# Patient Record
Sex: Male | Born: 1945
Health system: Southern US, Community
[De-identification: ages and names within clinical notes are randomized; demographics above are authoritative.]

## PROBLEM LIST (undated history)

## (undated) DIAGNOSIS — E119 Type 2 diabetes mellitus without complications: Secondary | ICD-10-CM

## (undated) DIAGNOSIS — I679 Cerebrovascular disease, unspecified: Secondary | ICD-10-CM

## (undated) DIAGNOSIS — I2510 Atherosclerotic heart disease of native coronary artery without angina pectoris: Secondary | ICD-10-CM

## (undated) DIAGNOSIS — E785 Hyperlipidemia, unspecified: Secondary | ICD-10-CM

## (undated) DIAGNOSIS — Z87442 Personal history of urinary calculi: Secondary | ICD-10-CM

## (undated) DIAGNOSIS — Z720 Tobacco use: Secondary | ICD-10-CM

## (undated) DIAGNOSIS — N400 Enlarged prostate without lower urinary tract symptoms: Secondary | ICD-10-CM

## (undated) DIAGNOSIS — I739 Peripheral vascular disease, unspecified: Secondary | ICD-10-CM

## (undated) DIAGNOSIS — I10 Essential (primary) hypertension: Secondary | ICD-10-CM

## (undated) HISTORY — DX: Benign prostatic hyperplasia without lower urinary tract symptoms: N40.0

## (undated) HISTORY — DX: Essential (primary) hypertension: I10

## (undated) HISTORY — DX: Tobacco use: Z72.0

## (undated) HISTORY — DX: Personal history of urinary calculi: Z87.442

## (undated) HISTORY — DX: Type 2 diabetes mellitus without complications: E11.9

## (undated) HISTORY — PX: CORONARY ANGIOPLASTY: SHX604

## (undated) HISTORY — DX: Cerebrovascular disease, unspecified: I67.9

## (undated) HISTORY — DX: Hyperlipidemia, unspecified: E78.5

---

## 2015-10-24 DIAGNOSIS — Z0000 Encounter for general adult medical examination without abnormal findings: Secondary | ICD-10-CM | POA: Diagnosis not present

## 2015-10-24 DIAGNOSIS — N400 Enlarged prostate without lower urinary tract symptoms: Secondary | ICD-10-CM | POA: Diagnosis not present

## 2015-10-24 DIAGNOSIS — E119 Type 2 diabetes mellitus without complications: Secondary | ICD-10-CM | POA: Diagnosis not present

## 2015-10-24 DIAGNOSIS — E7800 Pure hypercholesterolemia, unspecified: Secondary | ICD-10-CM | POA: Diagnosis not present

## 2015-10-24 DIAGNOSIS — I2510 Atherosclerotic heart disease of native coronary artery without angina pectoris: Secondary | ICD-10-CM | POA: Diagnosis not present

## 2015-10-24 DIAGNOSIS — Z1389 Encounter for screening for other disorder: Secondary | ICD-10-CM | POA: Diagnosis not present

## 2015-10-24 DIAGNOSIS — I10 Essential (primary) hypertension: Secondary | ICD-10-CM | POA: Diagnosis not present

## 2015-11-08 DIAGNOSIS — T783XXA Angioneurotic edema, initial encounter: Secondary | ICD-10-CM | POA: Diagnosis not present

## 2015-12-07 DIAGNOSIS — H353131 Nonexudative age-related macular degeneration, bilateral, early dry stage: Secondary | ICD-10-CM | POA: Diagnosis not present

## 2016-01-22 DIAGNOSIS — E7800 Pure hypercholesterolemia, unspecified: Secondary | ICD-10-CM | POA: Diagnosis not present

## 2016-01-22 DIAGNOSIS — E119 Type 2 diabetes mellitus without complications: Secondary | ICD-10-CM | POA: Diagnosis not present

## 2016-01-22 DIAGNOSIS — I10 Essential (primary) hypertension: Secondary | ICD-10-CM | POA: Diagnosis not present

## 2016-04-08 DIAGNOSIS — L03011 Cellulitis of right finger: Secondary | ICD-10-CM | POA: Diagnosis not present

## 2016-04-12 DIAGNOSIS — L03011 Cellulitis of right finger: Secondary | ICD-10-CM | POA: Diagnosis not present

## 2016-04-24 DIAGNOSIS — I10 Essential (primary) hypertension: Secondary | ICD-10-CM | POA: Diagnosis not present

## 2016-04-24 DIAGNOSIS — E119 Type 2 diabetes mellitus without complications: Secondary | ICD-10-CM | POA: Diagnosis not present

## 2016-04-24 DIAGNOSIS — E7800 Pure hypercholesterolemia, unspecified: Secondary | ICD-10-CM | POA: Diagnosis not present

## 2016-06-10 DIAGNOSIS — H5203 Hypermetropia, bilateral: Secondary | ICD-10-CM | POA: Diagnosis not present

## 2016-06-10 DIAGNOSIS — E1139 Type 2 diabetes mellitus with other diabetic ophthalmic complication: Secondary | ICD-10-CM | POA: Diagnosis not present

## 2016-06-10 DIAGNOSIS — H353131 Nonexudative age-related macular degeneration, bilateral, early dry stage: Secondary | ICD-10-CM | POA: Diagnosis not present

## 2016-06-17 DIAGNOSIS — Z23 Encounter for immunization: Secondary | ICD-10-CM | POA: Diagnosis not present

## 2016-06-17 DIAGNOSIS — E162 Hypoglycemia, unspecified: Secondary | ICD-10-CM | POA: Diagnosis not present

## 2016-07-30 DIAGNOSIS — E119 Type 2 diabetes mellitus without complications: Secondary | ICD-10-CM | POA: Diagnosis not present

## 2016-09-20 DIAGNOSIS — D3502 Benign neoplasm of left adrenal gland: Secondary | ICD-10-CM | POA: Diagnosis not present

## 2016-09-20 DIAGNOSIS — I719 Aortic aneurysm of unspecified site, without rupture: Secondary | ICD-10-CM | POA: Diagnosis not present

## 2016-09-20 DIAGNOSIS — N200 Calculus of kidney: Secondary | ICD-10-CM | POA: Diagnosis not present

## 2016-09-20 DIAGNOSIS — D3501 Benign neoplasm of right adrenal gland: Secondary | ICD-10-CM | POA: Diagnosis not present

## 2016-09-20 DIAGNOSIS — R109 Unspecified abdominal pain: Secondary | ICD-10-CM | POA: Diagnosis not present

## 2016-09-25 DIAGNOSIS — N200 Calculus of kidney: Secondary | ICD-10-CM | POA: Diagnosis not present

## 2016-09-25 DIAGNOSIS — E278 Other specified disorders of adrenal gland: Secondary | ICD-10-CM | POA: Diagnosis not present

## 2016-09-25 DIAGNOSIS — I728 Aneurysm of other specified arteries: Secondary | ICD-10-CM | POA: Diagnosis not present

## 2016-09-25 DIAGNOSIS — I723 Aneurysm of iliac artery: Secondary | ICD-10-CM | POA: Diagnosis not present

## 2016-09-25 DIAGNOSIS — K5730 Diverticulosis of large intestine without perforation or abscess without bleeding: Secondary | ICD-10-CM | POA: Diagnosis not present

## 2016-09-25 DIAGNOSIS — I714 Abdominal aortic aneurysm, without rupture: Secondary | ICD-10-CM | POA: Diagnosis not present

## 2016-09-30 DIAGNOSIS — I10 Essential (primary) hypertension: Secondary | ICD-10-CM | POA: Diagnosis not present

## 2016-09-30 DIAGNOSIS — I723 Aneurysm of iliac artery: Secondary | ICD-10-CM | POA: Diagnosis not present

## 2016-09-30 DIAGNOSIS — R0989 Other specified symptoms and signs involving the circulatory and respiratory systems: Secondary | ICD-10-CM | POA: Insufficient documentation

## 2016-09-30 DIAGNOSIS — I2510 Atherosclerotic heart disease of native coronary artery without angina pectoris: Secondary | ICD-10-CM | POA: Diagnosis not present

## 2016-09-30 DIAGNOSIS — I714 Abdominal aortic aneurysm, without rupture: Secondary | ICD-10-CM | POA: Diagnosis not present

## 2016-09-30 DIAGNOSIS — E1159 Type 2 diabetes mellitus with other circulatory complications: Secondary | ICD-10-CM | POA: Diagnosis not present

## 2016-09-30 DIAGNOSIS — I70203 Unspecified atherosclerosis of native arteries of extremities, bilateral legs: Secondary | ICD-10-CM | POA: Diagnosis not present

## 2016-10-02 DIAGNOSIS — I2510 Atherosclerotic heart disease of native coronary artery without angina pectoris: Secondary | ICD-10-CM | POA: Diagnosis not present

## 2016-10-02 DIAGNOSIS — Z720 Tobacco use: Secondary | ICD-10-CM | POA: Diagnosis not present

## 2016-10-02 DIAGNOSIS — I714 Abdominal aortic aneurysm, without rupture: Secondary | ICD-10-CM | POA: Diagnosis not present

## 2016-10-11 DIAGNOSIS — I6523 Occlusion and stenosis of bilateral carotid arteries: Secondary | ICD-10-CM | POA: Diagnosis not present

## 2016-10-11 DIAGNOSIS — I70203 Unspecified atherosclerosis of native arteries of extremities, bilateral legs: Secondary | ICD-10-CM | POA: Diagnosis not present

## 2016-10-11 DIAGNOSIS — R0989 Other specified symptoms and signs involving the circulatory and respiratory systems: Secondary | ICD-10-CM | POA: Diagnosis not present

## 2016-10-14 DIAGNOSIS — I70203 Unspecified atherosclerosis of native arteries of extremities, bilateral legs: Secondary | ICD-10-CM | POA: Diagnosis not present

## 2016-10-14 DIAGNOSIS — I2510 Atherosclerotic heart disease of native coronary artery without angina pectoris: Secondary | ICD-10-CM | POA: Diagnosis not present

## 2016-10-14 DIAGNOSIS — E1159 Type 2 diabetes mellitus with other circulatory complications: Secondary | ICD-10-CM | POA: Diagnosis not present

## 2016-10-14 DIAGNOSIS — I10 Essential (primary) hypertension: Secondary | ICD-10-CM | POA: Diagnosis not present

## 2016-10-14 DIAGNOSIS — I714 Abdominal aortic aneurysm, without rupture: Secondary | ICD-10-CM | POA: Diagnosis not present

## 2016-10-14 DIAGNOSIS — I723 Aneurysm of iliac artery: Secondary | ICD-10-CM | POA: Diagnosis not present

## 2016-10-14 DIAGNOSIS — I6523 Occlusion and stenosis of bilateral carotid arteries: Secondary | ICD-10-CM | POA: Diagnosis not present

## 2016-10-17 DIAGNOSIS — I2510 Atherosclerotic heart disease of native coronary artery without angina pectoris: Secondary | ICD-10-CM | POA: Diagnosis not present

## 2016-10-17 DIAGNOSIS — I714 Abdominal aortic aneurysm, without rupture: Secondary | ICD-10-CM | POA: Diagnosis not present

## 2016-11-05 DIAGNOSIS — Z6823 Body mass index (BMI) 23.0-23.9, adult: Secondary | ICD-10-CM | POA: Diagnosis not present

## 2016-11-05 DIAGNOSIS — I2510 Atherosclerotic heart disease of native coronary artery without angina pectoris: Secondary | ICD-10-CM | POA: Diagnosis not present

## 2016-11-05 DIAGNOSIS — Z1389 Encounter for screening for other disorder: Secondary | ICD-10-CM | POA: Diagnosis not present

## 2016-11-05 DIAGNOSIS — Z87442 Personal history of urinary calculi: Secondary | ICD-10-CM | POA: Diagnosis not present

## 2016-11-05 DIAGNOSIS — Z1212 Encounter for screening for malignant neoplasm of rectum: Secondary | ICD-10-CM | POA: Diagnosis not present

## 2016-11-05 DIAGNOSIS — Z720 Tobacco use: Secondary | ICD-10-CM | POA: Diagnosis not present

## 2016-11-05 DIAGNOSIS — Z0000 Encounter for general adult medical examination without abnormal findings: Secondary | ICD-10-CM | POA: Diagnosis not present

## 2016-11-05 DIAGNOSIS — I10 Essential (primary) hypertension: Secondary | ICD-10-CM | POA: Diagnosis not present

## 2016-11-05 DIAGNOSIS — Z87891 Personal history of nicotine dependence: Secondary | ICD-10-CM | POA: Diagnosis not present

## 2016-11-05 DIAGNOSIS — I714 Abdominal aortic aneurysm, without rupture: Secondary | ICD-10-CM | POA: Diagnosis not present

## 2016-11-05 DIAGNOSIS — N400 Enlarged prostate without lower urinary tract symptoms: Secondary | ICD-10-CM | POA: Diagnosis not present

## 2016-11-05 DIAGNOSIS — E119 Type 2 diabetes mellitus without complications: Secondary | ICD-10-CM | POA: Diagnosis not present

## 2016-11-07 DIAGNOSIS — Z01812 Encounter for preprocedural laboratory examination: Secondary | ICD-10-CM | POA: Diagnosis not present

## 2016-11-13 DIAGNOSIS — I2510 Atherosclerotic heart disease of native coronary artery without angina pectoris: Secondary | ICD-10-CM | POA: Diagnosis not present

## 2016-11-13 DIAGNOSIS — I70203 Unspecified atherosclerosis of native arteries of extremities, bilateral legs: Secondary | ICD-10-CM | POA: Diagnosis not present

## 2016-11-13 DIAGNOSIS — F17200 Nicotine dependence, unspecified, uncomplicated: Secondary | ICD-10-CM | POA: Diagnosis not present

## 2016-11-13 DIAGNOSIS — E1159 Type 2 diabetes mellitus with other circulatory complications: Secondary | ICD-10-CM | POA: Diagnosis not present

## 2016-11-13 DIAGNOSIS — I6523 Occlusion and stenosis of bilateral carotid arteries: Secondary | ICD-10-CM | POA: Diagnosis not present

## 2016-11-13 DIAGNOSIS — I714 Abdominal aortic aneurysm, without rupture: Secondary | ICD-10-CM | POA: Diagnosis not present

## 2016-11-13 DIAGNOSIS — Z7984 Long term (current) use of oral hypoglycemic drugs: Secondary | ICD-10-CM | POA: Diagnosis not present

## 2016-11-13 DIAGNOSIS — I723 Aneurysm of iliac artery: Secondary | ICD-10-CM | POA: Diagnosis not present

## 2016-11-13 DIAGNOSIS — I10 Essential (primary) hypertension: Secondary | ICD-10-CM | POA: Diagnosis not present

## 2016-12-09 DIAGNOSIS — H353131 Nonexudative age-related macular degeneration, bilateral, early dry stage: Secondary | ICD-10-CM | POA: Diagnosis not present

## 2017-01-03 DIAGNOSIS — I714 Abdominal aortic aneurysm, without rupture: Secondary | ICD-10-CM | POA: Diagnosis not present

## 2017-01-03 DIAGNOSIS — Z95820 Peripheral vascular angioplasty status with implants and grafts: Secondary | ICD-10-CM | POA: Diagnosis not present

## 2017-01-03 DIAGNOSIS — I719 Aortic aneurysm of unspecified site, without rupture: Secondary | ICD-10-CM | POA: Diagnosis not present

## 2017-06-11 DIAGNOSIS — H5203 Hypermetropia, bilateral: Secondary | ICD-10-CM | POA: Diagnosis not present

## 2017-06-11 DIAGNOSIS — H353131 Nonexudative age-related macular degeneration, bilateral, early dry stage: Secondary | ICD-10-CM | POA: Diagnosis not present

## 2017-06-11 DIAGNOSIS — H2513 Age-related nuclear cataract, bilateral: Secondary | ICD-10-CM | POA: Diagnosis not present

## 2017-06-11 DIAGNOSIS — E1139 Type 2 diabetes mellitus with other diabetic ophthalmic complication: Secondary | ICD-10-CM | POA: Diagnosis not present

## 2017-06-17 DIAGNOSIS — E1159 Type 2 diabetes mellitus with other circulatory complications: Secondary | ICD-10-CM | POA: Diagnosis not present

## 2017-06-17 DIAGNOSIS — I70203 Unspecified atherosclerosis of native arteries of extremities, bilateral legs: Secondary | ICD-10-CM | POA: Diagnosis not present

## 2017-06-17 DIAGNOSIS — I2510 Atherosclerotic heart disease of native coronary artery without angina pectoris: Secondary | ICD-10-CM | POA: Diagnosis not present

## 2017-06-17 DIAGNOSIS — I10 Essential (primary) hypertension: Secondary | ICD-10-CM | POA: Diagnosis not present

## 2017-07-15 DIAGNOSIS — Z23 Encounter for immunization: Secondary | ICD-10-CM | POA: Diagnosis not present

## 2017-07-16 DIAGNOSIS — E7800 Pure hypercholesterolemia, unspecified: Secondary | ICD-10-CM | POA: Diagnosis not present

## 2017-07-16 DIAGNOSIS — E119 Type 2 diabetes mellitus without complications: Secondary | ICD-10-CM | POA: Diagnosis not present

## 2017-07-16 DIAGNOSIS — I10 Essential (primary) hypertension: Secondary | ICD-10-CM | POA: Diagnosis not present

## 2017-09-08 DIAGNOSIS — Z95828 Presence of other vascular implants and grafts: Secondary | ICD-10-CM | POA: Diagnosis not present

## 2017-09-08 DIAGNOSIS — I714 Abdominal aortic aneurysm, without rupture: Secondary | ICD-10-CM | POA: Diagnosis not present

## 2017-09-08 DIAGNOSIS — Z95820 Peripheral vascular angioplasty status with implants and grafts: Secondary | ICD-10-CM | POA: Diagnosis not present

## 2017-09-29 DIAGNOSIS — I714 Abdominal aortic aneurysm, without rupture: Secondary | ICD-10-CM | POA: Diagnosis not present

## 2017-09-29 DIAGNOSIS — I723 Aneurysm of iliac artery: Secondary | ICD-10-CM | POA: Diagnosis not present

## 2017-09-29 DIAGNOSIS — I6523 Occlusion and stenosis of bilateral carotid arteries: Secondary | ICD-10-CM | POA: Diagnosis not present

## 2017-09-29 DIAGNOSIS — I70203 Unspecified atherosclerosis of native arteries of extremities, bilateral legs: Secondary | ICD-10-CM | POA: Diagnosis not present

## 2018-02-04 DIAGNOSIS — E119 Type 2 diabetes mellitus without complications: Secondary | ICD-10-CM | POA: Diagnosis not present

## 2018-04-02 DIAGNOSIS — I70203 Unspecified atherosclerosis of native arteries of extremities, bilateral legs: Secondary | ICD-10-CM | POA: Diagnosis not present

## 2018-04-02 DIAGNOSIS — I714 Abdominal aortic aneurysm, without rupture: Secondary | ICD-10-CM | POA: Diagnosis not present

## 2018-04-02 DIAGNOSIS — I723 Aneurysm of iliac artery: Secondary | ICD-10-CM | POA: Diagnosis not present

## 2018-04-02 DIAGNOSIS — I724 Aneurysm of artery of lower extremity: Secondary | ICD-10-CM | POA: Diagnosis not present

## 2018-04-02 DIAGNOSIS — I6523 Occlusion and stenosis of bilateral carotid arteries: Secondary | ICD-10-CM | POA: Diagnosis not present

## 2018-05-06 ENCOUNTER — Other Ambulatory Visit: Payer: Self-pay

## 2018-05-20 DIAGNOSIS — E7800 Pure hypercholesterolemia, unspecified: Secondary | ICD-10-CM | POA: Diagnosis not present

## 2018-05-20 DIAGNOSIS — Z0000 Encounter for general adult medical examination without abnormal findings: Secondary | ICD-10-CM | POA: Diagnosis not present

## 2018-05-20 DIAGNOSIS — I10 Essential (primary) hypertension: Secondary | ICD-10-CM | POA: Diagnosis not present

## 2018-05-20 DIAGNOSIS — I2510 Atherosclerotic heart disease of native coronary artery without angina pectoris: Secondary | ICD-10-CM | POA: Diagnosis not present

## 2018-05-20 DIAGNOSIS — Z87891 Personal history of nicotine dependence: Secondary | ICD-10-CM | POA: Diagnosis not present

## 2018-05-20 DIAGNOSIS — E119 Type 2 diabetes mellitus without complications: Secondary | ICD-10-CM | POA: Diagnosis not present

## 2018-05-20 DIAGNOSIS — Z1389 Encounter for screening for other disorder: Secondary | ICD-10-CM | POA: Diagnosis not present

## 2018-05-20 DIAGNOSIS — N400 Enlarged prostate without lower urinary tract symptoms: Secondary | ICD-10-CM | POA: Diagnosis not present

## 2018-05-20 DIAGNOSIS — Z23 Encounter for immunization: Secondary | ICD-10-CM | POA: Diagnosis not present

## 2018-06-10 DIAGNOSIS — T783XXA Angioneurotic edema, initial encounter: Secondary | ICD-10-CM | POA: Diagnosis not present

## 2018-11-16 DIAGNOSIS — R05 Cough: Secondary | ICD-10-CM | POA: Diagnosis not present

## 2019-04-06 DIAGNOSIS — I714 Abdominal aortic aneurysm, without rupture: Secondary | ICD-10-CM | POA: Diagnosis not present

## 2019-04-06 DIAGNOSIS — E1159 Type 2 diabetes mellitus with other circulatory complications: Secondary | ICD-10-CM | POA: Diagnosis not present

## 2019-04-06 DIAGNOSIS — Z95828 Presence of other vascular implants and grafts: Secondary | ICD-10-CM | POA: Diagnosis not present

## 2019-04-06 DIAGNOSIS — I6523 Occlusion and stenosis of bilateral carotid arteries: Secondary | ICD-10-CM | POA: Diagnosis not present

## 2019-04-06 DIAGNOSIS — I70203 Unspecified atherosclerosis of native arteries of extremities, bilateral legs: Secondary | ICD-10-CM | POA: Diagnosis not present

## 2019-04-06 DIAGNOSIS — I10 Essential (primary) hypertension: Secondary | ICD-10-CM | POA: Diagnosis not present

## 2019-04-06 DIAGNOSIS — I723 Aneurysm of iliac artery: Secondary | ICD-10-CM | POA: Diagnosis not present

## 2019-04-06 DIAGNOSIS — I724 Aneurysm of artery of lower extremity: Secondary | ICD-10-CM | POA: Diagnosis not present

## 2019-04-06 DIAGNOSIS — F17200 Nicotine dependence, unspecified, uncomplicated: Secondary | ICD-10-CM | POA: Diagnosis not present

## 2019-06-17 DIAGNOSIS — Z23 Encounter for immunization: Secondary | ICD-10-CM | POA: Diagnosis not present

## 2019-07-26 DIAGNOSIS — E785 Hyperlipidemia, unspecified: Secondary | ICD-10-CM | POA: Diagnosis not present

## 2019-07-26 DIAGNOSIS — E119 Type 2 diabetes mellitus without complications: Secondary | ICD-10-CM | POA: Diagnosis not present

## 2019-07-26 DIAGNOSIS — I10 Essential (primary) hypertension: Secondary | ICD-10-CM | POA: Diagnosis not present

## 2019-07-26 DIAGNOSIS — Z0000 Encounter for general adult medical examination without abnormal findings: Secondary | ICD-10-CM | POA: Diagnosis not present

## 2019-07-26 DIAGNOSIS — I2510 Atherosclerotic heart disease of native coronary artery without angina pectoris: Secondary | ICD-10-CM | POA: Diagnosis not present

## 2019-07-26 DIAGNOSIS — Z1389 Encounter for screening for other disorder: Secondary | ICD-10-CM | POA: Diagnosis not present

## 2019-07-26 DIAGNOSIS — N400 Enlarged prostate without lower urinary tract symptoms: Secondary | ICD-10-CM | POA: Diagnosis not present

## 2019-08-16 ENCOUNTER — Other Ambulatory Visit: Payer: Self-pay

## 2019-09-23 DIAGNOSIS — H5203 Hypermetropia, bilateral: Secondary | ICD-10-CM | POA: Diagnosis not present

## 2019-09-23 DIAGNOSIS — H353131 Nonexudative age-related macular degeneration, bilateral, early dry stage: Secondary | ICD-10-CM | POA: Diagnosis not present

## 2019-09-23 DIAGNOSIS — H2513 Age-related nuclear cataract, bilateral: Secondary | ICD-10-CM | POA: Diagnosis not present

## 2020-02-18 DIAGNOSIS — E119 Type 2 diabetes mellitus without complications: Secondary | ICD-10-CM | POA: Diagnosis not present

## 2020-02-18 DIAGNOSIS — I10 Essential (primary) hypertension: Secondary | ICD-10-CM | POA: Diagnosis not present

## 2020-02-18 DIAGNOSIS — R262 Difficulty in walking, not elsewhere classified: Secondary | ICD-10-CM | POA: Diagnosis not present

## 2020-02-29 DIAGNOSIS — H6691 Otitis media, unspecified, right ear: Secondary | ICD-10-CM | POA: Diagnosis not present

## 2020-03-10 DIAGNOSIS — M6281 Muscle weakness (generalized): Secondary | ICD-10-CM | POA: Diagnosis not present

## 2020-03-10 DIAGNOSIS — R2689 Other abnormalities of gait and mobility: Secondary | ICD-10-CM | POA: Diagnosis not present

## 2020-03-20 DIAGNOSIS — R2689 Other abnormalities of gait and mobility: Secondary | ICD-10-CM | POA: Diagnosis not present

## 2020-03-20 DIAGNOSIS — M6281 Muscle weakness (generalized): Secondary | ICD-10-CM | POA: Diagnosis not present

## 2020-03-29 DIAGNOSIS — R2689 Other abnormalities of gait and mobility: Secondary | ICD-10-CM | POA: Diagnosis not present

## 2020-03-29 DIAGNOSIS — M6281 Muscle weakness (generalized): Secondary | ICD-10-CM | POA: Diagnosis not present

## 2020-05-19 DIAGNOSIS — H6121 Impacted cerumen, right ear: Secondary | ICD-10-CM | POA: Diagnosis not present

## 2020-05-24 DIAGNOSIS — E119 Type 2 diabetes mellitus without complications: Secondary | ICD-10-CM | POA: Diagnosis not present

## 2020-05-24 DIAGNOSIS — I10 Essential (primary) hypertension: Secondary | ICD-10-CM | POA: Diagnosis not present

## 2020-05-24 DIAGNOSIS — R262 Difficulty in walking, not elsewhere classified: Secondary | ICD-10-CM | POA: Diagnosis not present

## 2020-06-01 DIAGNOSIS — F17200 Nicotine dependence, unspecified, uncomplicated: Secondary | ICD-10-CM | POA: Diagnosis not present

## 2020-06-01 DIAGNOSIS — E1159 Type 2 diabetes mellitus with other circulatory complications: Secondary | ICD-10-CM | POA: Diagnosis not present

## 2020-06-01 DIAGNOSIS — I70203 Unspecified atherosclerosis of native arteries of extremities, bilateral legs: Secondary | ICD-10-CM | POA: Diagnosis not present

## 2020-06-01 DIAGNOSIS — Z95828 Presence of other vascular implants and grafts: Secondary | ICD-10-CM | POA: Diagnosis not present

## 2020-06-01 DIAGNOSIS — I723 Aneurysm of iliac artery: Secondary | ICD-10-CM | POA: Diagnosis not present

## 2020-06-01 DIAGNOSIS — I10 Essential (primary) hypertension: Secondary | ICD-10-CM | POA: Diagnosis not present

## 2020-06-01 DIAGNOSIS — I6523 Occlusion and stenosis of bilateral carotid arteries: Secondary | ICD-10-CM | POA: Diagnosis not present

## 2020-06-01 DIAGNOSIS — I714 Abdominal aortic aneurysm, without rupture: Secondary | ICD-10-CM | POA: Diagnosis not present

## 2020-06-15 ENCOUNTER — Encounter (HOSPITAL_COMMUNITY): Payer: Self-pay | Admitting: Student in an Organized Health Care Education/Training Program

## 2020-06-15 ENCOUNTER — Emergency Department (HOSPITAL_COMMUNITY): Payer: PPO

## 2020-06-15 ENCOUNTER — Inpatient Hospital Stay (HOSPITAL_COMMUNITY)
Admission: EM | Admit: 2020-06-15 | Discharge: 2020-06-19 | DRG: 065 | Disposition: A | Discharge status: Rehabilitation Facility | Payer: PPO | Attending: Student in an Organized Health Care Education/Training Program | Admitting: Student in an Organized Health Care Education/Training Program

## 2020-06-15 ENCOUNTER — Other Ambulatory Visit: Payer: Self-pay

## 2020-06-15 ENCOUNTER — Inpatient Hospital Stay (HOSPITAL_COMMUNITY): Payer: PPO

## 2020-06-15 DIAGNOSIS — I10 Essential (primary) hypertension: Secondary | ICD-10-CM | POA: Diagnosis not present

## 2020-06-15 DIAGNOSIS — E1159 Type 2 diabetes mellitus with other circulatory complications: Secondary | ICD-10-CM

## 2020-06-15 DIAGNOSIS — Z79899 Other long term (current) drug therapy: Secondary | ICD-10-CM | POA: Diagnosis not present

## 2020-06-15 DIAGNOSIS — Z833 Family history of diabetes mellitus: Secondary | ICD-10-CM | POA: Diagnosis not present

## 2020-06-15 DIAGNOSIS — E1165 Type 2 diabetes mellitus with hyperglycemia: Secondary | ICD-10-CM | POA: Diagnosis not present

## 2020-06-15 DIAGNOSIS — F17210 Nicotine dependence, cigarettes, uncomplicated: Secondary | ICD-10-CM | POA: Diagnosis present

## 2020-06-15 DIAGNOSIS — I69322 Dysarthria following cerebral infarction: Secondary | ICD-10-CM | POA: Diagnosis not present

## 2020-06-15 DIAGNOSIS — I2510 Atherosclerotic heart disease of native coronary artery without angina pectoris: Secondary | ICD-10-CM | POA: Diagnosis present

## 2020-06-15 DIAGNOSIS — R471 Dysarthria and anarthria: Secondary | ICD-10-CM | POA: Diagnosis not present

## 2020-06-15 DIAGNOSIS — Z20822 Contact with and (suspected) exposure to covid-19: Secondary | ICD-10-CM | POA: Diagnosis not present

## 2020-06-15 DIAGNOSIS — D72829 Elevated white blood cell count, unspecified: Secondary | ICD-10-CM | POA: Diagnosis not present

## 2020-06-15 DIAGNOSIS — I6381 Other cerebral infarction due to occlusion or stenosis of small artery: Secondary | ICD-10-CM | POA: Diagnosis not present

## 2020-06-15 DIAGNOSIS — Z9861 Coronary angioplasty status: Secondary | ICD-10-CM | POA: Diagnosis not present

## 2020-06-15 DIAGNOSIS — E1142 Type 2 diabetes mellitus with diabetic polyneuropathy: Secondary | ICD-10-CM | POA: Diagnosis present

## 2020-06-15 DIAGNOSIS — M62838 Other muscle spasm: Secondary | ICD-10-CM | POA: Diagnosis not present

## 2020-06-15 DIAGNOSIS — E161 Other hypoglycemia: Secondary | ICD-10-CM | POA: Diagnosis not present

## 2020-06-15 DIAGNOSIS — Z7984 Long term (current) use of oral hypoglycemic drugs: Secondary | ICD-10-CM

## 2020-06-15 DIAGNOSIS — E1151 Type 2 diabetes mellitus with diabetic peripheral angiopathy without gangrene: Secondary | ICD-10-CM | POA: Diagnosis present

## 2020-06-15 DIAGNOSIS — E8809 Other disorders of plasma-protein metabolism, not elsewhere classified: Secondary | ICD-10-CM | POA: Diagnosis not present

## 2020-06-15 DIAGNOSIS — Z79890 Hormone replacement therapy (postmenopausal): Secondary | ICD-10-CM | POA: Diagnosis not present

## 2020-06-15 DIAGNOSIS — Z720 Tobacco use: Secondary | ICD-10-CM | POA: Diagnosis not present

## 2020-06-15 DIAGNOSIS — R29810 Facial weakness: Secondary | ICD-10-CM | POA: Diagnosis not present

## 2020-06-15 DIAGNOSIS — E46 Unspecified protein-calorie malnutrition: Secondary | ICD-10-CM | POA: Diagnosis not present

## 2020-06-15 DIAGNOSIS — I6523 Occlusion and stenosis of bilateral carotid arteries: Secondary | ICD-10-CM | POA: Diagnosis not present

## 2020-06-15 DIAGNOSIS — I714 Abdominal aortic aneurysm, without rupture, unspecified: Secondary | ICD-10-CM | POA: Diagnosis present

## 2020-06-15 DIAGNOSIS — I739 Peripheral vascular disease, unspecified: Secondary | ICD-10-CM | POA: Diagnosis present

## 2020-06-15 DIAGNOSIS — Z8679 Personal history of other diseases of the circulatory system: Secondary | ICD-10-CM

## 2020-06-15 DIAGNOSIS — D62 Acute posthemorrhagic anemia: Secondary | ICD-10-CM | POA: Diagnosis not present

## 2020-06-15 DIAGNOSIS — I69392 Facial weakness following cerebral infarction: Secondary | ICD-10-CM | POA: Diagnosis not present

## 2020-06-15 DIAGNOSIS — Z7902 Long term (current) use of antithrombotics/antiplatelets: Secondary | ICD-10-CM | POA: Diagnosis not present

## 2020-06-15 DIAGNOSIS — E871 Hypo-osmolality and hyponatremia: Secondary | ICD-10-CM | POA: Diagnosis not present

## 2020-06-15 DIAGNOSIS — R29703 NIHSS score 3: Secondary | ICD-10-CM | POA: Diagnosis present

## 2020-06-15 DIAGNOSIS — Z7982 Long term (current) use of aspirin: Secondary | ICD-10-CM

## 2020-06-15 DIAGNOSIS — E785 Hyperlipidemia, unspecified: Secondary | ICD-10-CM | POA: Diagnosis present

## 2020-06-15 DIAGNOSIS — G8191 Hemiplegia, unspecified affecting right dominant side: Secondary | ICD-10-CM | POA: Diagnosis not present

## 2020-06-15 DIAGNOSIS — Z87891 Personal history of nicotine dependence: Secondary | ICD-10-CM | POA: Diagnosis not present

## 2020-06-15 DIAGNOSIS — Z820 Family history of epilepsy and other diseases of the nervous system: Secondary | ICD-10-CM | POA: Diagnosis not present

## 2020-06-15 DIAGNOSIS — E441 Mild protein-calorie malnutrition: Secondary | ICD-10-CM | POA: Diagnosis not present

## 2020-06-15 DIAGNOSIS — Z23 Encounter for immunization: Secondary | ICD-10-CM | POA: Diagnosis not present

## 2020-06-15 DIAGNOSIS — I639 Cerebral infarction, unspecified: Secondary | ICD-10-CM | POA: Diagnosis not present

## 2020-06-15 DIAGNOSIS — R7309 Other abnormal glucose: Secondary | ICD-10-CM | POA: Diagnosis not present

## 2020-06-15 DIAGNOSIS — E119 Type 2 diabetes mellitus without complications: Secondary | ICD-10-CM | POA: Diagnosis not present

## 2020-06-15 DIAGNOSIS — E162 Hypoglycemia, unspecified: Secondary | ICD-10-CM | POA: Diagnosis not present

## 2020-06-15 DIAGNOSIS — I6389 Other cerebral infarction: Secondary | ICD-10-CM | POA: Diagnosis not present

## 2020-06-15 DIAGNOSIS — I69351 Hemiplegia and hemiparesis following cerebral infarction affecting right dominant side: Secondary | ICD-10-CM | POA: Diagnosis not present

## 2020-06-15 DIAGNOSIS — I679 Cerebrovascular disease, unspecified: Secondary | ICD-10-CM | POA: Diagnosis not present

## 2020-06-15 DIAGNOSIS — I63319 Cerebral infarction due to thrombosis of unspecified middle cerebral artery: Secondary | ICD-10-CM | POA: Diagnosis not present

## 2020-06-15 DIAGNOSIS — F17200 Nicotine dependence, unspecified, uncomplicated: Secondary | ICD-10-CM | POA: Diagnosis present

## 2020-06-15 DIAGNOSIS — Z8673 Personal history of transient ischemic attack (TIA), and cerebral infarction without residual deficits: Secondary | ICD-10-CM | POA: Diagnosis not present

## 2020-06-15 DIAGNOSIS — R29898 Other symptoms and signs involving the musculoskeletal system: Secondary | ICD-10-CM | POA: Diagnosis not present

## 2020-06-15 DIAGNOSIS — R252 Cramp and spasm: Secondary | ICD-10-CM | POA: Diagnosis not present

## 2020-06-15 DIAGNOSIS — R531 Weakness: Secondary | ICD-10-CM | POA: Diagnosis not present

## 2020-06-15 DIAGNOSIS — Z95828 Presence of other vascular implants and grafts: Secondary | ICD-10-CM | POA: Diagnosis not present

## 2020-06-15 DIAGNOSIS — I723 Aneurysm of iliac artery: Secondary | ICD-10-CM | POA: Diagnosis not present

## 2020-06-15 DIAGNOSIS — R0989 Other specified symptoms and signs involving the circulatory and respiratory systems: Secondary | ICD-10-CM | POA: Diagnosis not present

## 2020-06-15 HISTORY — DX: Atherosclerotic heart disease of native coronary artery without angina pectoris: I25.10

## 2020-06-15 HISTORY — DX: Peripheral vascular disease, unspecified: I73.9

## 2020-06-15 HISTORY — DX: Type 2 diabetes mellitus without complications: E11.9

## 2020-06-15 LAB — COMPREHENSIVE METABOLIC PANEL
ALT: 12 U/L (ref 0–44)
AST: 16 U/L (ref 15–41)
Albumin: 4 g/dL (ref 3.5–5.0)
Alkaline Phosphatase: 80 U/L (ref 38–126)
Anion gap: 13 (ref 5–15)
BUN: 7 mg/dL — ABNORMAL LOW (ref 8–23)
CO2: 22 mmol/L (ref 22–32)
Calcium: 9.2 mg/dL (ref 8.9–10.3)
Chloride: 103 mmol/L (ref 98–111)
Creatinine, Ser: 0.64 mg/dL (ref 0.61–1.24)
GFR calc Af Amer: >60 mL/min (ref >60)
GFR calc non Af Amer: >60 mL/min (ref >60)
Glucose, Bld: 157 mg/dL — ABNORMAL HIGH (ref 70–99)
Potassium: 4.7 mmol/L (ref 3.5–5.1)
Sodium: 138 mmol/L (ref 135–145)
Total Bilirubin: 1.5 mg/dL — ABNORMAL HIGH (ref 0.3–1.2)
Total Protein: 7 g/dL (ref 6.5–8.1)

## 2020-06-15 LAB — PROTIME-INR
INR: 1 (ref 0.8–1.2)
Prothrombin Time: 13 seconds (ref 11.4–15.2)

## 2020-06-15 LAB — I-STAT CHEM 8, ED
BUN: 6 mg/dL — ABNORMAL LOW (ref 8–23)
Calcium, Ion: 1.07 mmol/L — ABNORMAL LOW (ref 1.15–1.40)
Chloride: 104 mmol/L (ref 98–111)
Creatinine, Ser: 0.5 mg/dL — ABNORMAL LOW (ref 0.61–1.24)
Glucose, Bld: 158 mg/dL — ABNORMAL HIGH (ref 70–99)
HCT: 48 % (ref 39.0–52.0)
Hemoglobin: 16.3 g/dL (ref 13.0–17.0)
Potassium: 4.4 mmol/L (ref 3.5–5.1)
Sodium: 140 mmol/L (ref 135–145)
TCO2: 24 mmol/L (ref 22–32)

## 2020-06-15 LAB — RAPID URINE DRUG SCREEN, HOSP PERFORMED
Amphetamines: NOT DETECTED
Barbiturates: NOT DETECTED
Benzodiazepines: NOT DETECTED
Cocaine: NOT DETECTED
Opiates: NOT DETECTED
Tetrahydrocannabinol: NOT DETECTED

## 2020-06-15 LAB — CBC
HCT: 46.9 % (ref 39.0–52.0)
Hemoglobin: 15.1 g/dL (ref 13.0–17.0)
MCH: 32.1 pg (ref 26.0–34.0)
MCHC: 32.2 g/dL (ref 30.0–36.0)
MCV: 99.8 fL (ref 80.0–100.0)
Platelets: 236 10*3/uL (ref 150–400)
RBC: 4.7 MIL/uL (ref 4.22–5.81)
RDW: 13.8 % (ref 11.5–15.5)
WBC: 8.2 10*3/uL (ref 4.0–10.5)
nRBC: 0 % (ref 0.0–0.2)

## 2020-06-15 LAB — LIPID PANEL
Cholesterol: 104 mg/dL (ref 0–200)
HDL: 37 mg/dL — ABNORMAL LOW (ref >40)
LDL Cholesterol: 38 mg/dL (ref 0–99)
Total CHOL/HDL Ratio: 2.8 RATIO
Triglycerides: 147 mg/dL (ref <150)
VLDL: 29 mg/dL (ref 0–40)

## 2020-06-15 LAB — RESPIRATORY PANEL BY RT PCR (FLU A&B, COVID)
Influenza A by PCR: NEGATIVE
Influenza B by PCR: NEGATIVE
SARS Coronavirus 2 by RT PCR: NEGATIVE

## 2020-06-15 LAB — DIFFERENTIAL
Abs Immature Granulocytes: 0.03 10*3/uL (ref 0.00–0.07)
Basophils Absolute: 0 10*3/uL (ref 0.0–0.1)
Basophils Relative: 1 %
Eosinophils Absolute: 0.3 10*3/uL (ref 0.0–0.5)
Eosinophils Relative: 3 %
Immature Granulocytes: 0 %
Lymphocytes Relative: 15 %
Lymphs Abs: 1.3 10*3/uL (ref 0.7–4.0)
Monocytes Absolute: 0.8 10*3/uL (ref 0.1–1.0)
Monocytes Relative: 10 %
Neutro Abs: 5.8 10*3/uL (ref 1.7–7.7)
Neutrophils Relative %: 71 %

## 2020-06-15 LAB — CBG MONITORING, ED: Glucose-Capillary: 166 mg/dL — ABNORMAL HIGH (ref 70–99)

## 2020-06-15 LAB — URINALYSIS, ROUTINE W REFLEX MICROSCOPIC
Bacteria, UA: NONE SEEN
Bilirubin Urine: NEGATIVE
Glucose, UA: NEGATIVE mg/dL
Ketones, ur: NEGATIVE mg/dL
Leukocytes,Ua: NEGATIVE
Nitrite: NEGATIVE
Protein, ur: NEGATIVE mg/dL
Specific Gravity, Urine: 1.019 (ref 1.005–1.030)
pH: 7 (ref 5.0–8.0)

## 2020-06-15 LAB — APTT: aPTT: 37 seconds — ABNORMAL HIGH (ref 24–36)

## 2020-06-15 LAB — ETHANOL: Alcohol, Ethyl (B): <10 mg/dL (ref <10)

## 2020-06-15 LAB — HEMOGLOBIN A1C
Hgb A1c MFr Bld: 6.9 % — ABNORMAL HIGH (ref 4.8–5.6)
Mean Plasma Glucose: 151.33 mg/dL

## 2020-06-15 MED ORDER — INFLUENZA VAC A&B SA ADJ QUAD 0.5 ML IM PRSY
0.5000 mL | PREFILLED_SYRINGE | INTRAMUSCULAR | Status: AC
  Administered 2020-06-16: 0.5 mL via INTRAMUSCULAR
  Filled 2020-06-15: qty 0.5

## 2020-06-15 MED ORDER — IOHEXOL 350 MG/ML SOLN
100.0000 mL | Freq: Once | INTRAVENOUS | Status: AC | PRN
  Administered 2020-06-15: 100 mL via INTRAVENOUS

## 2020-06-15 MED ORDER — ENOXAPARIN SODIUM 40 MG/0.4ML ~~LOC~~ SOLN
40.0000 mg | SUBCUTANEOUS | Status: DC
  Administered 2020-06-15 – 2020-06-19 (×5): 40 mg via SUBCUTANEOUS
  Filled 2020-06-15 (×5): qty 0.4

## 2020-06-15 MED ORDER — ACETAMINOPHEN 650 MG RE SUPP: 650.0000 mg | Freq: Four times a day (QID) | RECTAL | Status: DC | PRN

## 2020-06-15 MED ORDER — NICOTINE 21 MG/24HR TD PT24
21.0000 mg | MEDICATED_PATCH | Freq: Every day | TRANSDERMAL | Status: DC
  Filled 2020-06-15 (×4): qty 1

## 2020-06-15 MED ORDER — ACETAMINOPHEN 325 MG PO TABS: 650.0000 mg | ORAL_TABLET | Freq: Four times a day (QID) | ORAL | Status: DC | PRN

## 2020-06-15 MED ORDER — ATORVASTATIN CALCIUM 80 MG PO TABS
80.0000 mg | ORAL_TABLET | Freq: Every day | ORAL | Status: DC
  Administered 2020-06-15 – 2020-06-19 (×5): 80 mg via ORAL
  Filled 2020-06-15 (×6): qty 1

## 2020-06-15 MED ORDER — ASPIRIN 325 MG PO TABS
325.0000 mg | ORAL_TABLET | Freq: Every day | ORAL | Status: DC
  Administered 2020-06-15 – 2020-06-16 (×2): 325 mg via ORAL
  Filled 2020-06-15 (×2): qty 1

## 2020-06-15 MED ORDER — CLOPIDOGREL BISULFATE 75 MG PO TABS
75.0000 mg | ORAL_TABLET | Freq: Every day | ORAL | Status: DC
  Administered 2020-06-15 – 2020-06-16 (×2): 75 mg via ORAL
  Filled 2020-06-15 (×2): qty 1

## 2020-06-15 NOTE — ED Provider Notes (Signed)
[facility] Provider Note   CSN: [id number] Arrival date & time: 06/15/20  0750     History Chief Complaint  Patient presents with  . Code Stroke    [facility] is a 74 y.o. male.  Hx of CAD, HTN, DM, PAD, abdominal aorta anuserym repair. On aspirin and plavix. Patient with BP 220 with EMS. No headache, n/v. Per EMS, right facial droop, right arm weakness.  The history is provided by the patient and the EMS personnel.  Neurologic Problem This is a new problem. The current episode started 6 to 12 hours ago (last known normal at 11 pm, woke up at 130 am with right sided weakness. ). The problem occurs constantly. The problem has not changed since onset.Pertinent negatives include no chest pain, no abdominal pain, no headaches and no shortness of breath. Nothing aggravates the symptoms. Nothing relieves the symptoms. He has tried nothing for the symptoms. The treatment provided no relief.       No past medical history on file.  There are no problems to display for this patient.     No family history on file.  Social History   Tobacco Use  . Smoking status: Not on file  Substance Use Topics  . Alcohol use: Not on file  . Drug use: Not on file    Home Medications Prior to Admission medications   Not on File    Allergies    Patient has no allergy information on record.  Review of Systems   Review of Systems  Constitutional: Negative for chills and fever.  HENT: Negative for ear pain and sore throat.   Eyes: Negative for pain and visual disturbance.  Respiratory: Negative for cough and shortness of breath.   Cardiovascular: Negative for chest pain and palpitations.  Gastrointestinal: Negative for abdominal pain and vomiting.  Genitourinary: Negative for dysuria and hematuria.  Musculoskeletal: Negative for arthralgias and back pain.  Skin: Negative for color change and rash.  Neurological: Positive for speech difficulty  and weakness. Negative for seizures, syncope and headaches.  All other systems reviewed and are negative.   Physical Exam Updated Vital Signs BP (!) 176/89   Pulse 96   Temp 98 F (36.7 C) (Oral)   Resp (!) 26   SpO2 97%   Physical Exam Vitals and nursing note reviewed.  Constitutional:      General: He is not in acute distress.    Appearance: He is well-developed. He is not ill-appearing.  HENT:     Head: Normocephalic and atraumatic.     Nose: Nose normal.     Mouth/Throat:     Mouth: Mucous membranes are moist.  Eyes:     Extraocular Movements: Extraocular movements intact.     Conjunctiva/sclera: Conjunctivae normal.     Pupils: Pupils are equal, round, and reactive to light.  Cardiovascular:     Rate and Rhythm: Normal rate and regular rhythm.     Pulses: Normal pulses.     Heart sounds: Normal heart sounds. No murmur heard.   Pulmonary:     Effort: Pulmonary effort is normal. No respiratory distress.     Breath sounds: Normal breath sounds.  Abdominal:     Palpations: Abdomen is soft.     Tenderness: There is no abdominal tenderness.  Musculoskeletal:     Cervical back: Normal range of motion and neck supple.  Skin:    General: Skin is warm and dry.     Capillary Refill:  Capillary refill takes less than 2 seconds.  Neurological:     Mental Status: He is alert and oriented to person, place, and time.     Sensory: No sensory deficit.     Motor: Weakness present.     Comments: 1+/2 out of 5 strength in the right upper extremity, 5+ out of 5 strength throughout otherwise, normal sensation, mild right facial droop, mild dysarthria  Psychiatric:        Mood and Affect: Mood normal.     ED Results / Procedures / Treatments   Labs (all labs ordered are listed, but only abnormal results are displayed) Labs Reviewed  APTT - Abnormal; Notable for the following components:      Result Value   aPTT 37 (*)    All other components within normal limits   COMPREHENSIVE METABOLIC PANEL - Abnormal; Notable for the following components:   Glucose, Bld 157 (*)    BUN 7 (*)    Total Bilirubin 1.5 (*)    All other components within normal limits  I-STAT CHEM 8, ED - Abnormal; Notable for the following components:   BUN 6 (*)    Creatinine, Ser 0.50 (*)    Glucose, Bld 158 (*)    Calcium, Ion 1.07 (*)    All other components within normal limits  CBG MONITORING, ED - Abnormal; Notable for the following components:   Glucose-Capillary 166 (*)    All other components within normal limits  RESPIRATORY PANEL BY RT PCR (FLU A&B, COVID)  ETHANOL  PROTIME-INR  CBC  DIFFERENTIAL  RAPID URINE DRUG SCREEN, HOSP PERFORMED  URINALYSIS, ROUTINE W REFLEX MICROSCOPIC    EKG None  Radiology CT Code Stroke CTA Head W/WO contrast  Result Date: 06/15/2020 CLINICAL DATA:  Right-sided weakness. EXAM: CT ANGIOGRAPHY HEAD AND NECK CT PERFUSION BRAIN TECHNIQUE: Multidetector CT imaging of the head and neck was performed using the standard protocol during bolus administration of intravenous contrast. Multiplanar CT image reconstructions and MIPs were obtained to evaluate the vascular anatomy. Carotid stenosis measurements (when applicable) are obtained utilizing NASCET criteria, using the distal internal carotid diameter as the denominator. Multiphase CT imaging of the brain was performed following IV bolus contrast injection. Subsequent parametric perfusion maps were calculated using RAPID software. CONTRAST:  Dose is currently unknown. COMPARISON:  Head CT from earlier the same day FINDINGS: CTA NECK FINDINGS Aortic arch: Atheromatous plaque.  Three vessel branching. Right carotid system: Mixed density mild for age plaque at the bifurcation without stenosis or ulceration. No ICA beading. Left carotid system: ICA tortuosity with loop. Mild plaque at the common carotid and bifurcation. No stenosis or ulceration. Vertebral arteries: No proximal subclavian stenosis.  There is bilateral subclavian atherosclerosis. Codominant vertebral arteries that are widely patent and smoothly contoured Skeleton: Facet osteoarthritis which is generalized with particularly bulky left-sided spurring at C4-5. Other neck: No evidence of mass or inflammation. Upper chest: Mild scar-like appearance in the upper lobes. Review of the MIP images confirms the above findings CTA HEAD FINDINGS Anterior circulation: Atheromatous plaque along the carotid siphons. No branch occlusion, beading, or aneurysm. No proximal flow limiting stenosis Posterior circulation: Vertebral and basilar arteries are smooth and widely patent. Elongation of the basilar which contacts the ventral brain. No branch occlusion, beading, or aneurysm. No proximal significant stenosis. Venous sinuses: Patent Anatomic variants: None significant Review of the MIP images confirms the above findings Prelim results were called by telephone at the time of interpretation on 06/15/2020 at 8:13 am to  provider ASHISH ARORA . CT Brain Perfusion Findings: ASPECTS: 10 CBF (<30%) Volume: 57mL Perfusion (Tmax>6.0s) volume: 61mL. This area is 1 slice above the left petrous temporal bone and could be artifactual given history of pure motor weakness. As well, the area appears to extend to the medial temporal lobe, extending towards PCA distribution IMPRESSION: CTA: 1. No emergent finding. Negative for large vessel occlusion or flow reducing stenosis. 2. Moderate atherosclerosis in the head and neck. CT perfusion: No detected infarct. There is a 7 cc area highlighted in the inferior left temporal lobe, possibly artifactual given the provided history and location along the skull base. Electronically Signed   By: Monte Fantasia M.D.   On: 06/15/2020 08:22   CT Code Stroke CTA Neck W/WO contrast  Result Date: 06/15/2020 CLINICAL DATA:  Right-sided weakness. EXAM: CT ANGIOGRAPHY HEAD AND NECK CT PERFUSION BRAIN TECHNIQUE: Multidetector CT imaging of the head  and neck was performed using the standard protocol during bolus administration of intravenous contrast. Multiplanar CT image reconstructions and MIPs were obtained to evaluate the vascular anatomy. Carotid stenosis measurements (when applicable) are obtained utilizing NASCET criteria, using the distal internal carotid diameter as the denominator. Multiphase CT imaging of the brain was performed following IV bolus contrast injection. Subsequent parametric perfusion maps were calculated using RAPID software. CONTRAST:  Dose is currently unknown. COMPARISON:  Head CT from earlier the same day FINDINGS: CTA NECK FINDINGS Aortic arch: Atheromatous plaque.  Three vessel branching. Right carotid system: Mixed density mild for age plaque at the bifurcation without stenosis or ulceration. No ICA beading. Left carotid system: ICA tortuosity with loop. Mild plaque at the common carotid and bifurcation. No stenosis or ulceration. Vertebral arteries: No proximal subclavian stenosis. There is bilateral subclavian atherosclerosis. Codominant vertebral arteries that are widely patent and smoothly contoured Skeleton: Facet osteoarthritis which is generalized with particularly bulky left-sided spurring at C4-5. Other neck: No evidence of mass or inflammation. Upper chest: Mild scar-like appearance in the upper lobes. Review of the MIP images confirms the above findings CTA HEAD FINDINGS Anterior circulation: Atheromatous plaque along the carotid siphons. No branch occlusion, beading, or aneurysm. No proximal flow limiting stenosis Posterior circulation: Vertebral and basilar arteries are smooth and widely patent. Elongation of the basilar which contacts the ventral brain. No branch occlusion, beading, or aneurysm. No proximal significant stenosis. Venous sinuses: Patent Anatomic variants: None significant Review of the MIP images confirms the above findings Prelim results were called by telephone at the time of interpretation on  06/15/2020 at 8:13 am to provider [place] . CT Brain Perfusion Findings: ASPECTS: 10 CBF (<30%) Volume: 69mL Perfusion (Tmax>6.0s) volume: 86mL. This area is 1 slice above the left petrous temporal bone and could be artifactual given history of pure motor weakness. As well, the area appears to extend to the medial temporal lobe, extending towards PCA distribution IMPRESSION: CTA: 1. No emergent finding. Negative for large vessel occlusion or flow reducing stenosis. 2. Moderate atherosclerosis in the head and neck. CT perfusion: No detected infarct. There is a 7 cc area highlighted in the inferior left temporal lobe, possibly artifactual given the provided history and location along the skull base. Electronically Signed   By: Monte Fantasia M.D.   On: 06/15/2020 08:22   CT Code Stroke Cerebral Perfusion with contrast  Result Date: 06/15/2020 CLINICAL DATA:  Right-sided weakness. EXAM: CT ANGIOGRAPHY HEAD AND NECK CT PERFUSION BRAIN TECHNIQUE: Multidetector CT imaging of the head and neck was performed using the standard protocol  during bolus administration of intravenous contrast. Multiplanar CT image reconstructions and MIPs were obtained to evaluate the vascular anatomy. Carotid stenosis measurements (when applicable) are obtained utilizing NASCET criteria, using the distal internal carotid diameter as the denominator. Multiphase CT imaging of the brain was performed following IV bolus contrast injection. Subsequent parametric perfusion maps were calculated using RAPID software. CONTRAST:  Dose is currently unknown. COMPARISON:  Head CT from earlier the same day FINDINGS: CTA NECK FINDINGS Aortic arch: Atheromatous plaque.  Three vessel branching. Right carotid system: Mixed density mild for age plaque at the bifurcation without stenosis or ulceration. No ICA beading. Left carotid system: ICA tortuosity with loop. Mild plaque at the common carotid and bifurcation. No stenosis or ulceration. Vertebral  arteries: No proximal subclavian stenosis. There is bilateral subclavian atherosclerosis. Codominant vertebral arteries that are widely patent and smoothly contoured Skeleton: Facet osteoarthritis which is generalized with particularly bulky left-sided spurring at C4-5. Other neck: No evidence of mass or inflammation. Upper chest: Mild scar-like appearance in the upper lobes. Review of the MIP images confirms the above findings CTA HEAD FINDINGS Anterior circulation: Atheromatous plaque along the carotid siphons. No branch occlusion, beading, or aneurysm. No proximal flow limiting stenosis Posterior circulation: Vertebral and basilar arteries are smooth and widely patent. Elongation of the basilar which contacts the ventral brain. No branch occlusion, beading, or aneurysm. No proximal significant stenosis. Venous sinuses: Patent Anatomic variants: None significant Review of the MIP images confirms the above findings Prelim results were called by telephone at the time of interpretation on 06/15/2020 at 8:13 am to provider [place] . CT Brain Perfusion Findings: ASPECTS: 10 CBF (<30%) Volume: 87mL Perfusion (Tmax>6.0s) volume: 13mL. This area is 1 slice above the left petrous temporal bone and could be artifactual given history of pure motor weakness. As well, the area appears to extend to the medial temporal lobe, extending towards PCA distribution IMPRESSION: CTA: 1. No emergent finding. Negative for large vessel occlusion or flow reducing stenosis. 2. Moderate atherosclerosis in the head and neck. CT perfusion: No detected infarct. There is a 7 cc area highlighted in the inferior left temporal lobe, possibly artifactual given the provided history and location along the skull base. Electronically Signed   By: Monte Fantasia M.D.   On: 06/15/2020 08:22   CT HEAD CODE STROKE WO CONTRAST  Result Date: 06/15/2020 CLINICAL DATA:  Code stroke.  Left-sided weakness EXAM: CT HEAD WITHOUT CONTRAST TECHNIQUE: Contiguous  axial images were obtained from the base of the skull through the vertex without intravenous contrast. COMPARISON:  None. FINDINGS: Brain: No evidence of acute infarction, hemorrhage, hydrocephalus, extra-axial collection or mass lesion/mass effect. Chronic lacunar infarct at the right corona radiata. Cerebral volume loss in keeping with aging. Vascular: No hyperdense vessel or unexpected calcification. Skull: Normal. Negative for fracture or focal lesion. Sinuses/Orbits: No acute finding. Other: These results were communicated to Dr. Rory Percy at 8:04 amon 9/23/2021by text page via the [facility] messaging system. ASPECTS [facility] Stroke Program Early CT Score) - Ganglionic level infarction (caudate, lentiform nuclei, internal capsule, insula, M1-M3 cortex): 7 - Supraganglionic infarction (M4-M6 cortex): 3 Total score (0-10 with 10 being normal): 10 IMPRESSION: 1. No acute finding.ASPECTS is 10. 2. Chronic lacunar infarct at the right corona radiata. Electronically Signed   By: Monte Fantasia M.D.   On: 06/15/2020 08:05    Procedures .Critical Care Performed by: Lennice Sites, DO Authorized by: Lennice Sites, DO   Critical care provider statement:    Critical care time (  minutes):  35   Critical care was necessary to treat or prevent imminent or life-threatening deterioration of the following conditions:  CNS failure or compromise   Critical care was time spent personally by me on the following activities:  Blood draw for specimens, development of treatment plan with patient or surrogate, discussions with consultants, discussions with primary provider, evaluation of patient's response to treatment, examination of patient, obtaining history from patient or surrogate, ordering and performing treatments and interventions, ordering and review of laboratory studies, pulse oximetry, ordering and review of radiographic studies, re-evaluation of patient's condition and review of old charts   I assumed direction of  critical care for this patient from another provider in my specialty: no     (including critical care time)  Medications Ordered in ED Medications  iohexol (OMNIPAQUE) 350 MG/ML injection 100 mL (100 mLs Intravenous Contrast Given 06/15/20 0815)    ED Course  I have reviewed the triage vital signs and the nursing notes.  Pertinent labs & imaging results that were available during my care of the patient were reviewed by me and considered in my medical decision making (see chart for details).    MDM Rules/Calculators/A&P                          Joedy Eickhoff is a 73 year old male with history of diabetes, CAD, PAD, hypertension, abdominal aneurysm repair who presents the ED as a code stroke.  Patient hypertensive with EMS in the 025 systolic.  No headache, no nausea, no vomiting.  Last known normal was at 11 PM.  Patient woke up at 1:30 AM with right-sided facial weakness and right arm weakness.  Arrives here outside of TPA window but concern for large vessel occlusion.  CT imaging shows no head bleed.  Possibly small embolic infarct vs artifact seen on CTA however not large enough.concerning enough for thrombectomy or interventional.  Neurology at the bedside upon arrival and I recommended hospital admission for further stroke work-up including MRI.  Lab work shows no significant anemia, electrolyte abnormality, kidney injury.  Patient with ongoing right upper extremity weakness, mild right-sided weakness, mild dysarthria.  Has had Covid vaccination.  EKG shows sinus rhythm.  No atrial fibrillation.  Will admit for further stroke care with medicine.  This chart was dictated using voice recognition software.  Despite best efforts to proofread,  errors can occur which can change the documentation meaning.    Final Clinical Impression(s) / ED Diagnoses Final diagnoses:  Cerebrovascular accident (CVA), unspecified mechanism [facility])    Rx / [facility] Orders ED Discharge Orders    None        Lennice Sites, DO 06/15/20 [phone]

## 2020-06-15 NOTE — ED Triage Notes (Signed)
Pt arrived by [facility], Code stroke called.  Pt reports waking up around 11pm last night and feeling normal while going to the bathroom, then at 0130 noticed he was having trouble moving his arm. In the morning he told his wife he couldn't moved it and had a fall while walking to bathroom, he was able to get himself back to standing.   Pt is alert and oriented. Does not appear in distress, and is not complaining of any pain at this time

## 2020-06-15 NOTE — H&P (Addendum)
Date: 06/15/2020               Patient Name:  Jacob Short MRN: [id number]  DOB: [birth year] Age / Sex: 74 y.o., male   PCP: Patient, No Pcp Per         Medical Service: Internal Medicine Teaching Service         Attending Physician: Dr. Evette Doffing, Mallie Mussel, *    First Contact: Dr. Lisabeth Devoid Pager: [phone]  Second Contact: Dr. Marianna Payment Pager: [phone]       After Hours (After 5p/  First Contact Pager: [phone]  weekends / holidays): Second Contact Pager: [phone]   Chief Complaint: Right facial droop and right arm weakness  History of Present Illness: This is a 74 year old male with a history of AAA status post repair in 2018, diabetes mellitus type 2, hypertension, CAD, iliac arterial aneurysm, carotid artery stenosis, PAD, and tobacco use who presented with right facial droop and right arm weakness.  Patient reports that around 11 PM last night he went to bed, states that around 130 this morning the woke up for short time and noted some right arm weakness but went back to bed.  He woke up this morning and  noted continued right-sided weakness, he went to try to use the bathroom but decided to go and sit on the chair. Wife woke up around 6:30 and checked on him and then they called EMS.  On arrival EMS noted that he was significantly hypertensive up to 295 systolics.  Code stroke was called and patient was brought into the ER. He denies any numbness, tingling, headaches, vision changes, nausea, vomiting, fevers, chills, abdominal pain, chest pain, shortness of breath, or other symptoms.  Had never had a stroke before. He reports taking his ASA and plavix every day, does not miss taking them. He smokes about 1 ppd, is trying to quit. His last A1c was 6.4.  In the ER patient was noted to be afebrile, RR up to 26, BP up to 176/89. CBC and CMP unremarkable. Neurology and EDP evaluated. CT head showed no actue findings, ASPECTs 10, chronic lacunar infarct at the right corona radiate. CTA head  showed no emergent finding, negative for large vessel occlusion, moderate atherosclerosis, no detected infarct on perfusion however did have 7 cc area in interior left temporal lobe possibly artifactual. EKG showed NSR and LVH. Patient outside window for tPA, admitted for stroke work up.   Meds:  Aspirin 325 Coreg 12.5 mg twice daily Plavix 75 mg daily Losartan 25 mg daily Metformin 1000 mg a.m., 1500 mg p.m. Rosuvastatin 10 mg daily  Allergies: Allergies as of 06/15/2020   (Not on File)   PMH:  -Peripheral artery disease with a stent in the right leg -Ischemic heart disease with PCI -Abdominal aortic aneurysm status post repair -Type 2 diabetes non-insulin-requiring -Tobacco use disorder -Hypertension  Family History: Heart disease, diabetes- Mother,  Alzheimer's disease, heart disease - Father, Diabetes - Brother   Social History: Smoke 1 PPD, trying to quit. Drinks 2 beverages (bourban and coke) per day. Denies recreational drug use. Lives with wife, cats, and dogs. 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   Review of Systems: A complete ROS was negative except as per HPI.   Physical Exam: Blood pressure (!) 174/105, pulse 92, temperature 98 F (36.7 C), temperature source Oral, resp. rate 15, SpO2 97 %. Physical Exam Constitutional:      Appearance: Normal appearance.  HENT:  Head: Normocephalic and atraumatic.     Nose: Nose normal. No congestion.     Mouth/Throat:     Mouth: Mucous membranes are moist.     Pharynx: Oropharynx is clear.  Eyes:     Extraocular Movements: Extraocular movements intact.     Conjunctiva/sclera: Conjunctivae normal.     Pupils: Pupils are equal, round, and reactive to light.  Cardiovascular:     Rate and Rhythm: Normal rate and regular rhythm.     Pulses: Normal pulses.     Heart sounds: Normal heart sounds. No murmur heard.   Pulmonary:     Effort: Pulmonary effort is normal.     Breath sounds: Normal breath sounds.   Abdominal:     General: Abdomen is flat. Bowel sounds are normal.     Palpations: Abdomen is soft.  Musculoskeletal:        General: No swelling. Normal range of motion.     Cervical back: Normal range of motion and neck supple.  Skin:    General: Skin is warm and dry.     Capillary Refill: Capillary refill takes less than 2 seconds.  Neurological:     Mental Status: He is alert and oriented to person, place, and time.     Cranial Nerves: Cranial nerve deficit (CN 2-12 intact except for mild right sided facial droop) present. No dysarthria.     Sensory: Sensation is intact. No sensory deficit.     Motor: Weakness (Right upper extermity 3/5, LUE 5/5, RLE and LLE 5/5) present. No tremor or atrophy.     Coordination: Coordination normal (Except for RUE, limited by weakness). Heel to [facility] Test normal.     Deep Tendon Reflexes: Reflexes are normal and symmetric.  Psychiatric:        Mood and Affect: Mood normal.        Behavior: Behavior normal.      EKG: personally reviewed my interpretation is sinus rhythm, heart rate around 91, LVH, left anterior fascicular block  CT head : IMPRESSION: 1. No acute finding.ASPECTS is 10. 2. Chronic lacunar infarct at the right corona radiata.  CTA head, cerebral perfusion: IMPRESSION: CTA:   1. No emergent finding. Negative for large vessel occlusion or flow reducing stenosis. 2. Moderate atherosclerosis in the head and neck.   CT perfusion:   No detected infarct. There is a 7 cc area highlighted in the inferior left temporal lobe, possibly artifactual given the provided history and location along the skull base.  Assessment & Plan by Problem: Active Problems:   CVA (cerebral vascular accident) [facility])  This is a 74 year old male with a history of AAA status post repair in 2018, diabetes mellitus type 2, hypertension, CAD, iliac arterial aneurysm, carotid artery stenosis, PAD, and tobacco use who presented with right facial droop and  right arm weakness.  Acute CVA: Patient has had a significant cardiovascular history including CAD, PAD, PVD, AAA status post repair, who is currently on aspirin and Plavix daily.  Noted to have a 1 day history of right facial droop and right arm weakness.  Blood pressure elevated up to 220 by EMS.  On exam, cardiac and pulmonary exam unremarkable, neurologic exam showed 3/5 weakness in right upper extremity and mild right facial droop.  CT head showed no acute findings, noted chronic lacunar infarct at the right corona radiata.  CTA with perfusion showed no detected infarct, 7 cc area highlighted in the inferior temporal lobe.  MRI showed acute lacunar infarct  at the left corona radiata.  Obtaining stroke work-up.  Patient already on ASA and Plavix and reported compliance with this. Unclear if adjustment to anticoagulation is needed.   - Neurology following, appreciate recommendations - Allow for permissive HTN, goal systolic < 814 and diastolic < 481) - Continue home ASA 325 mg - Continue home plavix 75 mg daily  - Echocardiogram  - A1C  - Lipid panel  - Tele monitoring  - SLP eval - PT/OT  CAD: PAD, PVD: AAA s/p repair: Seen by vascular surgery 9/9, noted increasing difficulties with instability and falling, walking daily, no other complaints.  At that time noted he could stop his Plavix and just continue the aspirin daily. Patient reported that he was still taking plavix daily. Will resume his ASA and plavix for now in setting of acute CVA however does not need to be on plavix for his endograft or carotid stenosis.   -Continue home ASA and plavix  Hypertension: Allowing for permissive HTN in setting of acute CVA. On losartan 25 mg daily at home.   Diabetes mellitus: On metformin 1000 mg in AM and 1500 mg in PM. Resume this.  -Checking A1c.  -Monitor CBGs  Tobacco use: Smokes 1 ppd, trying to quit. Discussed smoking cessation.  -Encourage smoking cessation -Nicotine patch while  here  Dispo: Admit patient to Inpatient with expected length of stay greater than 2 midnights.  Signed: Asencion Noble, MD 06/15/2020, 11:53 AM  Pager: [phone] After 5pm on weekdays and 1pm on weekends: On Call pager: [phone]

## 2020-06-15 NOTE — Progress Notes (Signed)
SLP Cancellation Note  Patient Details Name: Jacob Short MRN: [id number] DOB: [birth year]   Cancelled treatment:       Reason Eval/Treat Not Completed: SLP screened, no needs identified, will sign off  Pt passed [facility] swallow screen and RN indicated that he is tolerating the current diet well; therefore, no formalized SLP swallow eval is needed per protocol. Will sign off.    Chaynce Schafer I. Hardin Negus, [place], [place] Office number [phone] Pager [place] 06/15/2020, 5:33 PM

## 2020-06-15 NOTE — Consult Note (Signed)
Neurology Consultation  Reason for Consult: Code stroke-right-sided weakness Referring Physician: Dr. Lennice Sites  CC: Right-sided weakness  History is obtained from: Chart review-care everywhere, patient  HPI: Jacob Short is a 74 y.o. male past medical history of diabetes, hypertension, peripheral vascular disease, abdominal aortic aneurysm, presenting to the emergency room for evaluation of sudden onset of right arm weakness.  He went to bed around 11 PM with last known well around 9 PM, woke up this morning and could not move his right arm well.  Reports having woken up somewhere around 1:30 in the morning also and had noticed some right-sided weakness but did not make much of it.  In the morning, he called EMS.  He was assessed on scene.  Initial concern for neglect on the right side which prompted the activation of code stroke although he was outside the window for IV TPA. Reports motor symptoms only.  No sensory symptoms.  No double vision headache nausea vomiting. Denies prior strokes.    LKW: 11 PM on 06/14/2020 tpa given?: no, outside the window Premorbid modified Rankin scale (mRS): 0  ROS: Obtained and negative except noted in HPI  Past medical history: In medical record #[id number] medical record number in which the note is being dictated is [id number]. Past medical history Abdominal aortic aneurysm repaired endovascularly in 2018, diabetes type 2, hypertension, CAD, iliac artery aneurysm, carotid artery stenosis, peripheral arterial disease, tobacco abuse.  No family history on file. Family history unremarkable  Social History:   has no history on file for tobacco use, alcohol use, and drug use. Has a history of tobacco abuse  Medications No current facility-administered medications for this encounter. No current outpatient medications on file. Pending reconciliation  Exam: Current vital signs: BP (!) 176/89   Pulse 96   Temp 98 F (36.7 C) (Oral)    Resp (!) 26   SpO2 97%  Vital signs in last 24 hours: Temp:  [98 F (36.7 C)] 98 F (36.7 C) (09/23 0824) Pulse Rate:  [96] 96 (09/23 0824) Resp:  [26] 26 (09/23 0824) BP: (176)/(89) 176/89 (09/23 0824) SpO2:  [97 %] 97 % (09/23 0824) General: Awake alert in no distress HEENT: Normocephalic atraumatic, dry oral mucous membranes Lungs clear to auscultation Abdomen nondistended nontender Extremities warm well perfused Neurological exam Awake alert oriented x3 Speech is mildly dysarthric No evidence of aphasia Follows all commands Cranial nerves: Pupils equal round react light, extraocular movements intact, visual fields full, face appears mildly asymmetric with subtle right lower facial droop at rest but is able to engage all muscles while smiling, facial sensation intact, tongue and palate midline. Motor exam: Right upper extremity is significantly weak with 4 -/5 strength and a vertical drift.  No drift noted in the other 3 extremities. Sensory exam: Intact to touch without extinction on double simultaneous stimulation. Coordination: No dysmetria noted Gait testing deferred at this time NIH stroke scale-3    Labs I have reviewed labs in epic and the results pertinent to this consultation are:  CBC    Component Value Date/Time   WBC 8.2 06/15/2020 0750   RBC 4.70 06/15/2020 0750   HGB 16.3 06/15/2020 0803   HCT 48.0 06/15/2020 0803   PLT 236 06/15/2020 0750   MCV 99.8 06/15/2020 0750   MCH 32.1 06/15/2020 0750   MCHC 32.2 06/15/2020 0750   RDW 13.8 06/15/2020 0750   LYMPHSABS 1.3 06/15/2020 0750   MONOABS 0.8 06/15/2020 0750   EOSABS 0.3 06/15/2020 0750  BASOSABS 0.0 06/15/2020 0750    CMP     Component Value Date/Time   NA 140 06/15/2020 0803   K 4.4 06/15/2020 0803   CL 104 06/15/2020 0803   CO2 22 06/15/2020 0750   GLUCOSE 158 (H) 06/15/2020 0803   BUN 6 (L) 06/15/2020 0803   CREATININE 0.50 (L) 06/15/2020 0803   CALCIUM 9.2 06/15/2020 0750   PROT 7.0  06/15/2020 0750   ALBUMIN 4.0 06/15/2020 0750   AST 16 06/15/2020 0750   ALT 12 06/15/2020 0750   ALKPHOS 80 06/15/2020 0750   BILITOT 1.5 (H) 06/15/2020 0750   GFRNONAA >60 06/15/2020 0750   GFRAA >60 06/15/2020 0750    Imaging I have reviewed the images obtained:  CT-scan of the brain-no acute changes CTA head and neck with no emergent LVO CT perfusion: Small area of perfusion deficit in the left temporal lobe likely corresponding to a distal branch of the left MCA in the M3 or M4 territory.  Not clearly identified on CTA.  Assessment: 74 year old man with above past medical history presenting with sudden onset of right arm weakness.  On examination has mild right brachial paresis and subtle right facial droop and subtle dysarthria.  NIH stroke scale 3. Likely a left hemispheric stroke-embolic versus small vessel etiology given his risk factors. Going by the CT perfusion image, appears to be a small atheroembolic versus cardioembolic stroke in the left MCA territory-likely too distal for intervention. tPA not given: Outside the window Endovascular treatment not offered: Low stroke scale, likely very distal occlusion if embolic.  He will need stroke risk factor work-up  Impression:  Acute ischemic stroke Diabetes History of abdominal aortic aneurysm status post endovascular repair   Recommendations: -Continue home antiplatelets -Atorvastatin 80 -Frequent neurochecks -Telemetry -MRI brain without contrast -2D echo -A1c lipid panel -PT -OT next speech therapy -N.p.o. until cleared by bedside stroke swallow screen or formal swallow evaluation -Counseling needed on continued abstinence from tobacco. -Blood pressure: Allow for permissive hypertension, treat only if systolic blood pressures greater than 220 on a as needed basis for the next 48 to 72 hours.  Then start normalizing blood pressure for goal blood pressure of less than 140/90 on discharge.  Plan discussed with Dr.  Ronnald Nian in the ED.  Stroke neurology will follow with you  Please feel free to call with questions   -- Amie Portland, MD [facility] Pager: [phone] If 7pm to 7am, please call on call as listed on AMION.

## 2020-06-16 ENCOUNTER — Inpatient Hospital Stay (HOSPITAL_COMMUNITY): Payer: PPO

## 2020-06-16 DIAGNOSIS — E119 Type 2 diabetes mellitus without complications: Secondary | ICD-10-CM

## 2020-06-16 DIAGNOSIS — I63319 Cerebral infarction due to thrombosis of unspecified middle cerebral artery: Secondary | ICD-10-CM

## 2020-06-16 DIAGNOSIS — I714 Abdominal aortic aneurysm, without rupture, unspecified: Secondary | ICD-10-CM | POA: Diagnosis present

## 2020-06-16 DIAGNOSIS — I10 Essential (primary) hypertension: Secondary | ICD-10-CM

## 2020-06-16 DIAGNOSIS — F17210 Nicotine dependence, cigarettes, uncomplicated: Secondary | ICD-10-CM

## 2020-06-16 DIAGNOSIS — I6389 Other cerebral infarction: Secondary | ICD-10-CM

## 2020-06-16 DIAGNOSIS — I739 Peripheral vascular disease, unspecified: Secondary | ICD-10-CM

## 2020-06-16 DIAGNOSIS — E1159 Type 2 diabetes mellitus with other circulatory complications: Secondary | ICD-10-CM

## 2020-06-16 DIAGNOSIS — F17200 Nicotine dependence, unspecified, uncomplicated: Secondary | ICD-10-CM

## 2020-06-16 LAB — ECHOCARDIOGRAM COMPLETE
Area-P 1/2: 1.91 cm2
Calc EF: 44.7 %
Height: 76 in
S' Lateral: 3.5 cm
Single Plane A2C EF: 44.7 %
Single Plane A4C EF: 44 %
Weight: 2895.96 oz

## 2020-06-16 LAB — BASIC METABOLIC PANEL
Anion gap: 13 (ref 5–15)
BUN: 9 mg/dL (ref 8–23)
CO2: 24 mmol/L (ref 22–32)
Calcium: 9 mg/dL (ref 8.9–10.3)
Chloride: 103 mmol/L (ref 98–111)
Creatinine, Ser: 0.57 mg/dL — ABNORMAL LOW (ref 0.61–1.24)
GFR calc Af Amer: >60 mL/min (ref >60)
GFR calc non Af Amer: >60 mL/min (ref >60)
Glucose, Bld: 128 mg/dL — ABNORMAL HIGH (ref 70–99)
Potassium: 3.7 mmol/L (ref 3.5–5.1)
Sodium: 140 mmol/L (ref 135–145)

## 2020-06-16 LAB — CBC
HCT: 45.4 % (ref 39.0–52.0)
Hemoglobin: 15 g/dL (ref 13.0–17.0)
MCH: 31.8 pg (ref 26.0–34.0)
MCHC: 33 g/dL (ref 30.0–36.0)
MCV: 96.4 fL (ref 80.0–100.0)
Platelets: 237 10*3/uL (ref 150–400)
RBC: 4.71 MIL/uL (ref 4.22–5.81)
RDW: 13.9 % (ref 11.5–15.5)
WBC: 10.9 10*3/uL — ABNORMAL HIGH (ref 4.0–10.5)
nRBC: 0 % (ref 0.0–0.2)

## 2020-06-16 LAB — GLUCOSE, CAPILLARY: Glucose-Capillary: 143 mg/dL — ABNORMAL HIGH (ref 70–99)

## 2020-06-16 MED ORDER — TICAGRELOR 90 MG PO TABS
90.0000 mg | ORAL_TABLET | Freq: Two times a day (BID) | ORAL | Status: DC
  Administered 2020-06-16 – 2020-06-19 (×6): 90 mg via ORAL
  Filled 2020-06-16 (×6): qty 1

## 2020-06-16 MED ORDER — ASPIRIN EC 81 MG PO TBEC
81.0000 mg | DELAYED_RELEASE_TABLET | Freq: Every day | ORAL | Status: DC
  Administered 2020-06-17 – 2020-06-19 (×3): 81 mg via ORAL
  Filled 2020-06-16 (×4): qty 1

## 2020-06-16 NOTE — Consult Note (Signed)
Physical Medicine and Rehabilitation Consult Reason for Consult: Right side weakness Referring Physician: Internal medicine   HPI: Jacob Short is a 74 y.o. right-handed male CAD with angioplasty maintained on Plavix, diabetes mellitus, tobacco abuse, AAA without rupture, and hyperlipidemia.  Per chart review patient lives with spouse.  Independent prior to admission.  1 level home 3 steps to entry.  Presented 06/15/2020 with right side weakness cranial CT scan negative for acute changes.  Chronic lacunar infarct of the right corona radiata.  Patient did not receive TPA.  CT angiogram of head and neck with no emergent findings.  MRI showed acute lacunar infarct of the left corona radiata.  Echocardiogram pending.  Admission chemistries unremarkable except glucose 157, urine drug screen negative, urinalysis negative nitrite.  Currently maintained on aspirin and Plavix for CVA prophylaxis.  Subcutaneous Lovenox for DVT prophylaxis.  Tolerating a regular diet.  Therapy evaluations completed with recommendations of physical medicine rehab consult.   Review of Systems  Constitutional: Negative for chills, fever and malaise/fatigue.  HENT: Negative for hearing loss.   Eyes: Negative for blurred vision and double vision.  Respiratory: Negative for cough and shortness of breath.   Cardiovascular: Negative for chest pain, palpitations and leg swelling.  Gastrointestinal: Positive for constipation. Negative for heartburn, nausea and vomiting.  Genitourinary: Negative for dysuria and hematuria.  Musculoskeletal: Positive for myalgias.  Skin: Negative for rash.  Neurological: Positive for weakness.  All other systems reviewed and are negative.  Past Medical History:  Diagnosis Date  . Coronary artery disease   . Diabetes mellitus without complication ([place])   . Peripheral vascular disease [facility])    Past Surgical History:  Procedure Laterality Date  . CORONARY ANGIOPLASTY     History  reviewed. No pertinent family history. Social History:  reports that he quit smoking yesterday. He uses smokeless tobacco. He reports previous alcohol use. He reports previous drug use. Allergies: No Known Allergies Medications Prior to Admission  Medication Sig Dispense Refill  . aspirin 325 MG tablet Take 325 mg by mouth daily.    . clopidogrel (PLAVIX) 75 MG tablet Take 75 mg by mouth daily.    . Ibuprofen-diphenhydrAMINE HCl 200-25 MG CAPS Take 1-2 tablets by mouth at bedtime as needed (pain and sleep).    . losartan (COZAAR) 25 MG tablet Take 25 mg by mouth daily.    . metFORMIN (GLUCOPHAGE) 500 MG tablet Take 1,000-1,500 mg by mouth See admin instructions. Take 1000mg  in the morning and 1500mg  in the evening.    . naproxen sodium (ALEVE) 220 MG tablet Take 220 mg by mouth 2 (two) times daily as needed (pain).    . rosuvastatin (CRESTOR) 10 MG tablet Take 10 mg by mouth at bedtime.      Home: Home Living Family/patient expects to be discharged to:: Private residence Living Arrangements: Spouse/significant other Available Help at Discharge: Family, Available 24 hours/day Type of Home: House Home Access: Stairs to enter [employer] of Steps: 3 Entrance Stairs-Rails: Right, Left Home Layout: One level Bathroom Shower/Tub: Gaffer, Charity fundraiser: Standard Home Equipment: Careers adviser History: Prior Function Level of Independence: Independent Comments: Pt fully independent PTA Functional Status:  Mobility: Bed Mobility Overal bed mobility: Needs Assistance Bed Mobility: Supine to Sit, Sit to Supine Supine to sit: Mod assist Sit to supine: Min assist General bed mobility comments: head of bed was elevated.   Transfers Overall transfer level: Needs assistance Equipment used: Rolling walker (2 wheeled),  1 person hand held assist Transfers: Sit to/from Stand, Stand Pivot Transfers Sit to Stand: Mod assist Stand pivot transfers: Mod  assist General transfer comment: Pt R leg buckles.      ADL: ADL Overall ADL's : Needs assistance/impaired Eating/Feeding: Minimal assistance, Sitting, Cueing for compensatory techinques Eating/Feeding Details (indicate cue type and reason): must use LUE to feed self and is R dominant. Grooming: Wash/dry face, Wash/dry hands, Oral care, Minimal assistance, Sitting, Cueing for compensatory techniques Grooming Details (indicate cue type and reason): pt does well but needs cues to use LUE for now. Upper Body Bathing: Sitting, Moderate assistance, Cueing for compensatory techniques Upper Body Bathing Details (indicate cue type and reason): min assist to get to all areas with  no functional use of RUE and min guard for balance. Lower Body Bathing: Sit to/from stand, Moderate assistance, Cueing for compensatory techniques Lower Body Bathing Details (indicate cue type and reason): sit to stand with min assist but mod assist needed to maintain balance in standing. Upper Body Dressing : Moderate assistance, Sitting, Cueing for compensatory techniques Lower Body Dressing: Maximal assistance, Sit to/from stand, Cueing for compensatory techniques Lower Body Dressing Details (indicate cue type and reason): balance in sitting and standing limited when challengd. Toilet Transfer: Moderate assistance, Stand-pivot, BSC, RW Toilet Transfer Details (indicate cue type and reason): used walker with therapist assisting with [place] and Hygiene: Moderate assistance, Sit to/from stand, Cueing for compensatory techniques Functional mobility during ADLs: Moderate assistance, Rolling walker General ADL Comments: Pt very limited due to no functional use of RUE and LLE is weaker than LLE affecting balance and safety.  Cognition: Cognition Overall Cognitive Status: Within Functional Limits for tasks assessed Orientation Level: Oriented X4 Cognition Arousal/Alertness:  Awake/alert Behavior During Therapy: WFL for tasks assessed/performed Overall Cognitive Status: Within Functional Limits for tasks assessed General Comments: Pt very motivated and engaged.  Blood pressure (!) 176/88, pulse 74, temperature 98.7 F (37.1 C), temperature source Oral, resp. rate 14, height 6\' 4"  (1.93 m), weight 82.1 kg, SpO2 97 %. Physical Exam General: Alert and oriented x 3, No apparent distress HEENT: Head is normocephalic, atraumatic, PERRLA, EOMI, sclera anicteric, oral mucosa pink and moist, dentition intact, ext ear canals clear,  Neck: Supple without JVD or lymphadenopathy Heart: Reg rate and rhythm. No murmurs rubs or gallops Chest: CTA bilaterally without wheezes, rales, or rhonchi; no distress Abdomen: Soft, non-tender, non-distended, bowel sounds positive. Extremities: No clubbing, cyanosis, or edema. Pulses are 2+ Skin: Clean and intact without signs of breakdown Neuro: Patient is alert in no acute distress.  Oriented x3.  Mild dysarthria.  R facial droop. RUE w/ 1/5 SA, EF, 2/5 EE, 0/5 WE and hand grip. RLE 4+/5 throughout, L sided strength intact Psych: Pt's affect is appropriate. Pt is cooperative    Results for orders placed or performed during the hospital encounter of 06/15/20 (from the past 24 hour(s))  Lipid panel     Status: Abnormal   Collection Time: 06/15/20  6:22 PM  Result Value Ref Range   Cholesterol 104 0 - 200 mg/dL   Triglycerides 147 <150 mg/dL   HDL 37 (L) >40 mg/dL   Total CHOL/HDL Ratio 2.8 RATIO   VLDL 29 0 - 40 mg/dL   LDL Cholesterol 38 0 - 99 mg/dL  Hemoglobin A1c     Status: Abnormal   Collection Time: 06/15/20  6:22 PM  Result Value Ref Range   Hgb A1c MFr Bld 6.9 (H) 4.8 - 5.6 %  Mean Plasma Glucose 151.33 mg/dL  Basic metabolic panel     Status: Abnormal   Collection Time: 06/16/20  2:36 AM  Result Value Ref Range   Sodium 140 135 - 145 mmol/L   Potassium 3.7 3.5 - 5.1 mmol/L   Chloride 103 98 - 111 mmol/L   CO2 24  22 - 32 mmol/L   Glucose, Bld 128 (H) 70 - 99 mg/dL   BUN 9 8 - 23 mg/dL   Creatinine, Ser 0.57 (L) 0.61 - 1.24 mg/dL   Calcium 9.0 8.9 - 10.3 mg/dL   GFR calc non Af Amer >60 >60 mL/min   GFR calc Af Amer >60 >60 mL/min   Anion gap 13 5 - 15  CBC     Status: Abnormal   Collection Time: 06/16/20  2:36 AM  Result Value Ref Range   WBC 10.9 (H) 4.0 - 10.5 K/uL   RBC 4.71 4.22 - 5.81 MIL/uL   Hemoglobin 15.0 13.0 - 17.0 g/dL   HCT 45.4 39 - 52 %   MCV 96.4 80.0 - 100.0 fL   MCH 31.8 26.0 - 34.0 pg   MCHC 33.0 30.0 - 36.0 g/dL   RDW 13.9 11.5 - 15.5 %   Platelets 237 150 - 400 K/uL   nRBC 0.0 0.0 - 0.2 %  Glucose, capillary     Status: Abnormal   Collection Time: 06/16/20  6:28 AM  Result Value Ref Range   Glucose-Capillary 143 (H) 70 - 99 mg/dL   Comment 1 Notify RN    Comment 2 Document in Chart    CT Code Stroke CTA Head W/WO contrast  Result Date: 06/15/2020 CLINICAL DATA:  Right-sided weakness. EXAM: CT ANGIOGRAPHY HEAD AND NECK CT PERFUSION BRAIN TECHNIQUE: Multidetector CT imaging of the head and neck was performed using the standard protocol during bolus administration of intravenous contrast. Multiplanar CT image reconstructions and MIPs were obtained to evaluate the vascular anatomy. Carotid stenosis measurements (when applicable) are obtained utilizing NASCET criteria, using the distal internal carotid diameter as the denominator. Multiphase CT imaging of the brain was performed following IV bolus contrast injection. Subsequent parametric perfusion maps were calculated using RAPID software. CONTRAST:  Dose is currently unknown. COMPARISON:  Head CT from earlier the same day FINDINGS: CTA NECK FINDINGS Aortic arch: Atheromatous plaque.  Three vessel branching. Right carotid system: Mixed density mild for age plaque at the bifurcation without stenosis or ulceration. No ICA beading. Left carotid system: ICA tortuosity with loop. Mild plaque at the common carotid and bifurcation. No  stenosis or ulceration. Vertebral arteries: No proximal subclavian stenosis. There is bilateral subclavian atherosclerosis. Codominant vertebral arteries that are widely patent and smoothly contoured Skeleton: Facet osteoarthritis which is generalized with particularly bulky left-sided spurring at C4-5. Other neck: No evidence of mass or inflammation. Upper chest: Mild scar-like appearance in the upper lobes. Review of the MIP images confirms the above findings CTA HEAD FINDINGS Anterior circulation: Atheromatous plaque along the carotid siphons. No branch occlusion, beading, or aneurysm. No proximal flow limiting stenosis Posterior circulation: Vertebral and basilar arteries are smooth and widely patent. Elongation of the basilar which contacts the ventral brain. No branch occlusion, beading, or aneurysm. No proximal significant stenosis. Venous sinuses: Patent Anatomic variants: None significant Review of the MIP images confirms the above findings Prelim results were called by telephone at the time of interpretation on 06/15/2020 at 8:13 am to provider [place] . CT Brain Perfusion Findings: ASPECTS: 10 CBF (<30%) Volume: 27mL Perfusion (  Tmax>6.0s) volume: 28mL. This area is 1 slice above the left petrous temporal bone and could be artifactual given history of pure motor weakness. As well, the area appears to extend to the medial temporal lobe, extending towards PCA distribution IMPRESSION: CTA: 1. No emergent finding. Negative for large vessel occlusion or flow reducing stenosis. 2. Moderate atherosclerosis in the head and neck. CT perfusion: No detected infarct. There is a 7 cc area highlighted in the inferior left temporal lobe, possibly artifactual given the provided history and location along the skull base. Electronically Signed   By: Monte Fantasia M.D.   On: 06/15/2020 08:22   CT Code Stroke CTA Neck W/WO contrast  Result Date: 06/15/2020 CLINICAL DATA:  Right-sided weakness. EXAM: CT ANGIOGRAPHY  HEAD AND NECK CT PERFUSION BRAIN TECHNIQUE: Multidetector CT imaging of the head and neck was performed using the standard protocol during bolus administration of intravenous contrast. Multiplanar CT image reconstructions and MIPs were obtained to evaluate the vascular anatomy. Carotid stenosis measurements (when applicable) are obtained utilizing NASCET criteria, using the distal internal carotid diameter as the denominator. Multiphase CT imaging of the brain was performed following IV bolus contrast injection. Subsequent parametric perfusion maps were calculated using RAPID software. CONTRAST:  Dose is currently unknown. COMPARISON:  Head CT from earlier the same day FINDINGS: CTA NECK FINDINGS Aortic arch: Atheromatous plaque.  Three vessel branching. Right carotid system: Mixed density mild for age plaque at the bifurcation without stenosis or ulceration. No ICA beading. Left carotid system: ICA tortuosity with loop. Mild plaque at the common carotid and bifurcation. No stenosis or ulceration. Vertebral arteries: No proximal subclavian stenosis. There is bilateral subclavian atherosclerosis. Codominant vertebral arteries that are widely patent and smoothly contoured Skeleton: Facet osteoarthritis which is generalized with particularly bulky left-sided spurring at C4-5. Other neck: No evidence of mass or inflammation. Upper chest: Mild scar-like appearance in the upper lobes. Review of the MIP images confirms the above findings CTA HEAD FINDINGS Anterior circulation: Atheromatous plaque along the carotid siphons. No branch occlusion, beading, or aneurysm. No proximal flow limiting stenosis Posterior circulation: Vertebral and basilar arteries are smooth and widely patent. Elongation of the basilar which contacts the ventral brain. No branch occlusion, beading, or aneurysm. No proximal significant stenosis. Venous sinuses: Patent Anatomic variants: None significant Review of the MIP images confirms the above  findings Prelim results were called by telephone at the time of interpretation on 06/15/2020 at 8:13 am to provider [place] . CT Brain Perfusion Findings: ASPECTS: 10 CBF (<30%) Volume: 50mL Perfusion (Tmax>6.0s) volume: 32mL. This area is 1 slice above the left petrous temporal bone and could be artifactual given history of pure motor weakness. As well, the area appears to extend to the medial temporal lobe, extending towards PCA distribution IMPRESSION: CTA: 1. No emergent finding. Negative for large vessel occlusion or flow reducing stenosis. 2. Moderate atherosclerosis in the head and neck. CT perfusion: No detected infarct. There is a 7 cc area highlighted in the inferior left temporal lobe, possibly artifactual given the provided history and location along the skull base. Electronically Signed   By: Monte Fantasia M.D.   On: 06/15/2020 08:22   MR BRAIN WO CONTRAST  Result Date: 06/15/2020 CLINICAL DATA:  Neuro deficit, acute, with stroke suspected. Right-sided weakness EXAM: MRI HEAD WITHOUT CONTRAST TECHNIQUE: Multiplanar, multiecho pulse sequences of the brain and surrounding structures were obtained without intravenous contrast. COMPARISON:  Head CT and CTA from earlier today FINDINGS: Brain: 10 mm acute infarct  in the left corona radiata, adjacent to the lateral ventricle. Remote lacunar infarct at the right corona radiata. Ischemic gliosis is minor. Small focus of remote hemorrhage along the parasagittal left frontal lobe.Generalized cortical volume loss. No hemorrhage, hydrocephalus, or masslike finding. Vascular: Normal flow voids. Skull and upper cervical spine: Normal marrow signal Sinuses/Orbits: Retention cysts along the floor of the right maxillary sinus. IMPRESSION: Acute lacunar infarct at the left corona radiata. Electronically Signed   By: Monte Fantasia M.D.   On: 06/15/2020 10:51   CT Code Stroke Cerebral Perfusion with contrast  Result Date: 06/15/2020 CLINICAL DATA:  Right-sided  weakness. EXAM: CT ANGIOGRAPHY HEAD AND NECK CT PERFUSION BRAIN TECHNIQUE: Multidetector CT imaging of the head and neck was performed using the standard protocol during bolus administration of intravenous contrast. Multiplanar CT image reconstructions and MIPs were obtained to evaluate the vascular anatomy. Carotid stenosis measurements (when applicable) are obtained utilizing NASCET criteria, using the distal internal carotid diameter as the denominator. Multiphase CT imaging of the brain was performed following IV bolus contrast injection. Subsequent parametric perfusion maps were calculated using RAPID software. CONTRAST:  Dose is currently unknown. COMPARISON:  Head CT from earlier the same day FINDINGS: CTA NECK FINDINGS Aortic arch: Atheromatous plaque.  Three vessel branching. Right carotid system: Mixed density mild for age plaque at the bifurcation without stenosis or ulceration. No ICA beading. Left carotid system: ICA tortuosity with loop. Mild plaque at the common carotid and bifurcation. No stenosis or ulceration. Vertebral arteries: No proximal subclavian stenosis. There is bilateral subclavian atherosclerosis. Codominant vertebral arteries that are widely patent and smoothly contoured Skeleton: Facet osteoarthritis which is generalized with particularly bulky left-sided spurring at C4-5. Other neck: No evidence of mass or inflammation. Upper chest: Mild scar-like appearance in the upper lobes. Review of the MIP images confirms the above findings CTA HEAD FINDINGS Anterior circulation: Atheromatous plaque along the carotid siphons. No branch occlusion, beading, or aneurysm. No proximal flow limiting stenosis Posterior circulation: Vertebral and basilar arteries are smooth and widely patent. Elongation of the basilar which contacts the ventral brain. No branch occlusion, beading, or aneurysm. No proximal significant stenosis. Venous sinuses: Patent Anatomic variants: None significant Review of the MIP  images confirms the above findings Prelim results were called by telephone at the time of interpretation on 06/15/2020 at 8:13 am to provider [place] . CT Brain Perfusion Findings: ASPECTS: 10 CBF (<30%) Volume: 80mL Perfusion (Tmax>6.0s) volume: 69mL. This area is 1 slice above the left petrous temporal bone and could be artifactual given history of pure motor weakness. As well, the area appears to extend to the medial temporal lobe, extending towards PCA distribution IMPRESSION: CTA: 1. No emergent finding. Negative for large vessel occlusion or flow reducing stenosis. 2. Moderate atherosclerosis in the head and neck. CT perfusion: No detected infarct. There is a 7 cc area highlighted in the inferior left temporal lobe, possibly artifactual given the provided history and location along the skull base. Electronically Signed   By: Monte Fantasia M.D.   On: 06/15/2020 08:22   CT HEAD CODE STROKE WO CONTRAST  Result Date: 06/15/2020 CLINICAL DATA:  Code stroke.  Left-sided weakness EXAM: CT HEAD WITHOUT CONTRAST TECHNIQUE: Contiguous axial images were obtained from the base of the skull through the vertex without intravenous contrast. COMPARISON:  None. FINDINGS: Brain: No evidence of acute infarction, hemorrhage, hydrocephalus, extra-axial collection or mass lesion/mass effect. Chronic lacunar infarct at the right corona radiata. Cerebral volume loss in keeping with aging.  Vascular: No hyperdense vessel or unexpected calcification. Skull: Normal. Negative for fracture or focal lesion. Sinuses/Orbits: No acute finding. Other: These results were communicated to Dr. Rory Percy at 8:04 amon 9/23/2021by text page via the [facility] messaging system. ASPECTS [facility] Stroke Program Early CT Score) - Ganglionic level infarction (caudate, lentiform nuclei, internal capsule, insula, M1-M3 cortex): 7 - Supraganglionic infarction (M4-M6 cortex): 3 Total score (0-10 with 10 being normal): 10 IMPRESSION: 1. No acute finding.ASPECTS  is 10. 2. Chronic lacunar infarct at the right corona radiata. Electronically Signed   By: Monte Fantasia M.D.   On: 06/15/2020 08:05    Assessment/Plan: Diagnosis: L corona radiata infarct 1. Does the need for close, 24 hr/day medical supervision in concert with the patient's rehab needs make it unreasonable for this patient to be served in a less intensive setting? Yes 2. Co-Morbidities requiring supervision/potential complications: tobacco use disorder, PAD, AAA, DMII, RUE severe weakness 3. Due to bladder management, bowel management, safety, skin/wound care, disease management, medication administration, pain management and patient education, does the patient require 24 hr/day rehab nursing? Yes 4. Does the patient require coordinated care of a physician, rehab nurse, therapy disciplines of PT, OT to address physical and functional deficits in the context of the above medical diagnosis(es)? Yes Addressing deficits in the following areas: balance, endurance, locomotion, strength, transferring, bowel/bladder control, bathing, dressing, feeding, grooming, toileting, speech and psychosocial support 5. Can the patient actively participate in an intensive therapy program of at least 3 hrs of therapy per day at least 5 days per week? Yes 6. The potential for patient to make measurable gains while on inpatient rehab is excellent 7. Anticipated functional outcomes upon discharge from inpatient rehab are independent and modified independent  with PT, modified independent with OT, independent with SLP. 8. Estimated rehab length of stay to reach the above functional goals is: 10-14 days 9. Anticipated discharge destination: Home 10. Overall Rehab/Functional Prognosis: excellent  RECOMMENDATIONS: This patient's condition is appropriate for continued rehabilitative care in the following setting: CIR Patient has agreed to participate in recommended program. Yes Note that insurance prior authorization may  be required for reimbursement for recommended care.  Comment: Thank you for this consult. Admission coordinator to follow.   I have personally performed a face to face diagnostic evaluation, including, but not limited to relevant history and physical exam findings, of this patient and developed relevant assessment and plan.  Additionally, I have reviewed and concur with the physician assistant's documentation above.  Leeroy Cha, MD  Lavon Paganini Barney, PA-C 06/16/2020

## 2020-06-16 NOTE — Progress Notes (Signed)
  Echocardiogram 2D Echocardiogram has been performed.  Jacob Short 06/16/2020, 10:08 AM

## 2020-06-16 NOTE — Evaluation (Signed)
Physical Therapy Evaluation Patient Details Name: Jacob Short MRN: [id number] DOB: 05-05-1946 Today's Date: 06/16/2020   History of Present Illness  Pt is a 74 yo male admitted with R sided weakness.  Pt found to have a L corona radiata infarct.  PMH includes AAA, PVD, and DMII.  Clinical Impression   Pt presents with R-sided weakness RUE>RLE, impaired R coordination, difficulty performing mobility tasks, impaired seated and standing balance, impaired gait, and decreased activity tolerance vs baseline. Pt to benefit from acute PT to address deficits. Pt ambulated 3x15 ft with mod PT assist for RLE management and blocking, with LUE using chair rail for support. Pt is very motivated, and has hopes to return to complete independence. PT strongly recommending CIR to maximize pt functional recovery, pt has great family support in his wife who was present on eval. PT to progress mobility as tolerated, and will continue to follow acutely.      Follow Up Recommendations CIR    Equipment Recommendations  None recommended by PT    Recommendations for Other Services Rehab consult     Precautions / Restrictions Precautions Precautions: Fall Precaution Comments: R hemiparesis UE>LE Restrictions Weight Bearing Restrictions: No      Mobility  Bed Mobility Overal bed mobility: Needs Assistance Bed Mobility: Supine to Sit;Sit to Supine     Supine to sit: Min assist Sit to supine: Min assist   General bed mobility comments: Mod assist supine<>sit for RLE and RUE translation, scooting to and from EOB, LE lifting into bed. Increased time and effort, posterior leaning with scoot attempts.  Transfers Overall transfer level: Needs assistance Equipment used: 1 person hand held assist (+ L hand on sink counter) Transfers: Sit to/from [facility] Sit to Stand: Mod assist         General transfer comment: Mod assist for power up, steadying, RLE blocking as pt with  mild-moderate R knee buckling. Sit to stand x5, from recliner x4 and bed x1.  Ambulation/Gait Ambulation/Gait assistance: +2 safety/equipment;Mod assist Gait Distance (Feet): 15 Feet (3x15) Assistive device: 1 person hand held assist (+ LUE using chair rail in hallway as external support) Gait Pattern/deviations: Step-to pattern;Step-through pattern;Decreased stride length;Trunk flexed;Narrow base of support;Decreased stance time - right Gait velocity: decr   General Gait Details: Mod assist for R knee blocking in stance phase, physically shifting pt trunk L and R for offweighting LEs for swing phase, and steadying. Verbal cuing for using floor tiles as external cue to widen BOS, taking shorter steps to avoid severe RLE buckling. x3 short ambulation bouts with 1 minute seated rest.  Stairs            Wheelchair Mobility    Modified Rankin (Stroke Patients Only) Modified Rankin (Stroke Patients Only) Pre-Morbid Rankin Score: No symptoms Modified Rankin: Moderately severe disability     Balance Overall balance assessment: Needs assistance Sitting-balance support: Feet supported;Single extremity supported Sitting balance-Leahy Scale: Fair Sitting balance - Comments: posterior leaning with LE movement Postural control: Posterior lean;Left lateral lean Standing balance support: During functional activity;Single extremity supported Standing balance-Leahy Scale: Poor Standing balance comment: reliant on external support                             Pertinent Vitals/Pain Pain Assessment: No/denies pain    Home Living Family/patient expects to be discharged to:: Private residence Living Arrangements: Spouse/significant other Available Help at Discharge: Family;Available 24 hours/day Type of Home: [place]  Access: Stairs to enter Entrance Stairs-Rails: Psychiatric nurse of Steps: 3 Home Layout: One level Home Equipment: Shower seat      Prior  Function Level of Independence: Independent         Comments: Pt fully independent PTA; pt and wife enjoy going on beach vacations, going to the pool     Hand Dominance   Dominant Hand: Right    Extremity/Trunk Assessment   Upper Extremity Assessment Upper Extremity Assessment: Defer to OT evaluation    Lower Extremity Assessment Lower Extremity Assessment: RLE deficits/detail RLE Deficits / Details: 3/5 knee extension, 3/5 hip flexion assessed via SLR with <10* quad lag RLE Coordination: decreased fine motor;decreased gross motor    Cervical / Trunk Assessment Cervical / Trunk Assessment: Normal  Communication   Communication: No difficulties  Cognition Arousal/Alertness: Awake/alert Behavior During Therapy: WFL for tasks assessed/performed Overall Cognitive Status: Within Functional Limits for tasks assessed                                 General Comments: Pt very motivated and engaged, dry sense of humor and quick-witted.      General Comments      Exercises General Exercises - Lower Extremity Quad Sets: AROM;Right;5 reps;Supine (+ 2 second hold in knee extension, external PT cue behind pt knee)   Assessment/Plan    PT Assessment Patient needs continued PT services  PT Problem List Decreased strength;Decreased mobility;Decreased safety awareness;Decreased coordination;Decreased activity tolerance;Decreased balance;Decreased knowledge of use of DME       PT Treatment Interventions DME instruction;Therapeutic activities;Gait training;Patient/family education;Therapeutic exercise;Balance training;Functional mobility training;Neuromuscular re-education    PT Goals (Current goals can be found in the Care Plan section)  Acute Rehab PT Goals Patient Stated Goal: to get my strength and independence back PT Goal Formulation: With patient/family Time For Goal Achievement: 06/30/20 Potential to Achieve Goals: Good    Frequency Min 4X/week    Barriers to discharge        Co-evaluation               AM-PAC PT "6 Clicks" Mobility  Outcome Measure Help needed turning from your back to your side while in a flat bed without using bedrails?: A Little Help needed moving from lying on your back to sitting on the side of a flat bed without using bedrails?: A Lot Help needed moving to and from a bed to a chair (including a wheelchair)?: A Lot Help needed standing up from a chair using your arms (e.g., wheelchair or bedside chair)?: A Lot Help needed to walk in hospital room?: A Lot Help needed climbing 3-5 steps with a railing? : Total 6 Click Score: 12    End of Session Equipment Utilized During Treatment: Gait belt Activity Tolerance: Patient tolerated treatment well Patient left: in bed;with family/visitor present Nurse Communication: Mobility status PT Visit Diagnosis: Other abnormalities of gait and mobility (R26.89);Hemiplegia and hemiparesis Hemiplegia - Right/Left: Right Hemiplegia - dominant/non-dominant: Dominant Hemiplegia - caused by: Cerebral infarction    Time: 1350-1425 PT Time Calculation (min) (ACUTE ONLY): 35 min   Charges:   PT Evaluation $PT Eval Low Complexity: 1 Low PT Treatments $Gait Training: 8-22 mins       Lucciana Head E, PT Acute Rehabilitation Services Pager [phone]  Office [phone]   Cadey Bazile D Elonda Husky 06/16/2020, 2:48 PM

## 2020-06-16 NOTE — Progress Notes (Signed)
STROKE TEAM PROGRESS NOTE   INTERVAL HISTORY RN and wife at bedside.  Patient lying in bed, still has right-sided hemiparesis.  Received initial report from primary team that patient right-sided weakness worsened this morning.  Repeat MRI no change.  However, asking patient and RN, they stated that patient weakness remained the same, no change.  Vitals:   06/16/20 0410 06/16/20 0500 06/16/20 0800 06/16/20 1305  BP: (!) 183/103  (!) 176/88 (!) 182/90  Pulse: 74  74 81  Resp: 11  14 17   Temp: 97.7 F (36.5 C)  98.7 F (37.1 C) 98.7 F (37.1 C)  TempSrc: Oral  Oral Oral  SpO2: 97%  97% 98%  Weight:  82.1 kg    Height:       CBC:  Recent Labs  Lab 06/15/20 0750 06/15/20 0750 06/15/20 0803 06/16/20 0236  WBC 8.2  --   --  10.9*  NEUTROABS 5.8  --   --   --   HGB 15.1   < > 16.3 15.0  HCT 46.9   < > 48.0 45.4  MCV 99.8  --   --  96.4  PLT 236  --   --  237   < > = values in this interval not displayed.   Basic Metabolic Panel:  Recent Labs  Lab 06/15/20 0750 06/15/20 0750 06/15/20 0803 06/16/20 0236  NA 138   < > 140 140  K 4.7   < > 4.4 3.7  CL 103   < > 104 103  CO2 22  --   --  24  GLUCOSE 157*   < > 158* 128*  BUN 7*   < > 6* 9  CREATININE 0.64   < > 0.50* 0.57*  CALCIUM 9.2  --   --  9.0   < > = values in this interval not displayed.   Lipid Panel:  Recent Labs  Lab 06/15/20 1822  CHOL 104  TRIG 147  HDL 37*  CHOLHDL 2.8  VLDL 29  LDLCALC 38   HgbA1c:  Recent Labs  Lab 06/15/20 1822  HGBA1C 6.9*   Urine Drug Screen:  Recent Labs  Lab 06/15/20 0856  LABOPIA NONE DETECTED  COCAINSCRNUR NONE DETECTED  LABBENZ NONE DETECTED  AMPHETMU NONE DETECTED  THCU NONE DETECTED  LABBARB NONE DETECTED    Alcohol Level  Recent Labs  Lab 06/15/20 0750  ETH <10    IMAGING past 24 hours MR BRAIN WO CONTRAST  Result Date: 06/16/2020 CLINICAL DATA:  Right-sided weakness. EXAM: MRI HEAD WITHOUT CONTRAST TECHNIQUE: Multiplanar, multiecho pulse sequences  of the brain and surrounding structures were obtained without intravenous contrast. COMPARISON:  MRI of the brain June 15, 2020 FINDINGS: Brain: No significant change of the focus of acute/subacute infarct in the left corona radiata. No new focus of restricted diffusion seen. Again noted susceptibility artifact in the left parasagittal posterior frontal lobe, suggesting hemosiderin deposit. Vascular: Normal flow voids. Skull and upper cervical spine: Negative Sinuses/Orbits: Negative Other: None. IMPRESSION: No significant change of the focus of acute/subacute infarct in the left corona radiata. No new focus of restricted diffusion identified. Electronically Signed   By: Pedro Earls M.D.   On: 06/16/2020 12:55   ECHOCARDIOGRAM COMPLETE  Result Date: 06/16/2020    ECHOCARDIOGRAM REPORT   Patient Name:   Jacob Short Date of Exam: 06/16/2020 Medical Rec #:  [id number]       Height:       76.0 in Accession #:  [id number]      Weight:       181.0 lb Date of Birth:  [birth year]        BSA:          2.123 m Patient Age:    74 years        BP:           183/103 mmHg Patient Gender: M               HR:           59 bpm. Exam Location:  Inpatient Procedure: 2D Echo, Cardiac Doppler and Color Doppler Indications:    Stroke 434.91 / I163.9  History:        Patient has no prior history of Echocardiogram examinations.                 CAD; Risk Factors:Diabetes.  Sonographer:    Bernadene Person RDCS Referring Phys: [id number] [place]  1. Left ventricular ejection fraction, by estimation, is 55 to 60%. The left ventricle has normal function. The left ventricle has no regional wall motion abnormalities. Left ventricular diastolic parameters are consistent with Grade I diastolic dysfunction (impaired relaxation).  2. Right ventricular systolic function is normal. The right ventricular size is normal. Tricuspid regurgitation signal is inadequate for assessing PA pressure.  3. The  mitral valve is grossly normal. Trivial mitral valve regurgitation. No evidence of mitral stenosis.  4. The aortic valve is tricuspid. Aortic valve regurgitation is not visualized. No aortic stenosis is present.  5. The inferior vena cava is normal in size with greater than 50% respiratory variability, suggesting right atrial pressure of 3 mmHg. Conclusion(s)/Recommendation(s): No intracardiac source of embolism detected on this transthoracic study. A transesophageal echocardiogram is recommended to exclude cardiac source of embolism if clinically indicated. FINDINGS  Left Ventricle: Left ventricular ejection fraction, by estimation, is 55 to 60%. The left ventricle has normal function. The left ventricle has no regional wall motion abnormalities. The left ventricular internal cavity size was normal in size. There is  no left ventricular hypertrophy. Left ventricular diastolic parameters are consistent with Grade I diastolic dysfunction (impaired relaxation). Normal left ventricular filling pressure. Right Ventricle: The right ventricular size is normal. No increase in right ventricular wall thickness. Right ventricular systolic function is normal. Tricuspid regurgitation signal is inadequate for assessing PA pressure. Left Atrium: Left atrial size was normal in size. Right Atrium: Right atrial size was normal in size. Prominent Eustachian valve. Pericardium: Trivial pericardial effusion is present. Mitral Valve: The mitral valve is grossly normal. Trivial mitral valve regurgitation. No evidence of mitral valve stenosis. Tricuspid Valve: The tricuspid valve is grossly normal. Tricuspid valve regurgitation is trivial. No evidence of tricuspid stenosis. Aortic Valve: The aortic valve is tricuspid. Aortic valve regurgitation is not visualized. No aortic stenosis is present. Pulmonic Valve: The pulmonic valve was grossly normal. Pulmonic valve regurgitation is not visualized. No evidence of pulmonic stenosis. Aorta: The  aortic root and ascending aorta are structurally normal, with no evidence of dilitation. Venous: The inferior vena cava is normal in size with greater than 50% respiratory variability, suggesting right atrial pressure of 3 mmHg. IAS/Shunts: The atrial septum is grossly normal. EKG: Rhythm strip during this exam demostrated normal sinus rhythm and premature ventricular contractions.  LEFT VENTRICLE PLAX 2D LVIDd:         5.40 cm      Diastology LVIDs:         3.50 cm  LV e' medial:    4.30 cm/s LV PW:         1.30 cm      LV E/e' medial:  11.9 LV IVS:        1.00 cm      LV e' lateral:   7.54 cm/s LVOT diam:     2.40 cm      LV E/e' lateral: 6.8 LV SV:         92 LV SV Index:   43 LVOT Area:     4.52 cm  LV Volumes (MOD) LV vol d, MOD A2C: 118.0 ml LV vol d, MOD A4C: 118.0 ml LV vol s, MOD A2C: 65.3 ml LV vol s, MOD A4C: 66.1 ml LV SV MOD A2C:     52.7 ml LV SV MOD A4C:     118.0 ml LV SV MOD BP:      53.6 ml RIGHT VENTRICLE RV S prime:     7.14 cm/s TAPSE (M-mode): 1.6 cm LEFT ATRIUM             Index       RIGHT ATRIUM           Index LA diam:        3.10 cm 1.46 cm/m  RA Area:     14.50 cm LA Vol (A2C):   80.5 ml 37.91 ml/m RA Volume:   33.10 ml  15.59 ml/m LA Vol (A4C):   43.1 ml 20.30 ml/m LA Biplane Vol: 61.2 ml 28.82 ml/m  AORTIC VALVE LVOT Vmax:   95.50 cm/s LVOT Vmean:  64.750 cm/s LVOT VTI:    0.202 m  AORTA Ao Root diam: 3.50 cm Ao Asc diam:  3.30 cm MITRAL VALVE MV Area (PHT): 1.91 cm    SHUNTS MV Decel Time: 398 msec    Systemic VTI:  0.20 m MV E velocity: 51.00 cm/s  Systemic Diam: 2.40 cm MV A velocity: 67.90 cm/s MV E/A ratio:  0.75 Eleonore Chiquito MD Electronically signed by Eleonore Chiquito MD Signature Date/Time: 06/16/2020/10:58:42 AM    Final     PHYSICAL EXAM  Temp:  [97.4 F (36.3 C)-99 F (37.2 C)] 99 F (37.2 C) (09/24 1656) Pulse Rate:  [71-84] 81 (09/24 1305) Resp:  [11-20] 17 (09/24 1305) BP: (154-183)/(83-103) 170/99 (09/24 1656) SpO2:  [96 %-98 %] 98 % (09/24  1305) Weight:  [82.1 kg] 82.1 kg (09/24 0500)  General - Well nourished, well developed, in no apparent distress.  Ophthalmologic - fundi not visualized due to noncooperation.  Cardiovascular - Regular rhythm and rate.  Mental Status -  Level of arousal and orientation to time, place, and person were intact. Language including expression, naming, repetition, comprehension was assessed and found intact. Fund of Knowledge was assessed and was intact.  Cranial Nerves II - XII - II - Visual field intact OU. III, IV, VI - Extraocular movements intact. V - Facial sensation intact bilaterally. VII - right mild facial droop. VIII - Hearing & vestibular intact bilaterally. X - Palate elevates symmetrically. XI - Chin turning & shoulder shrug intact bilaterally. XII - Tongue protrusion intact.  Motor Strength - The patient's strength was normal in left upper and lower extremities, however right upper extremity 2-/5, lower extremity 4+/5.  Bulk was normal and fasciculations were absent.   Motor Tone - Muscle tone was assessed at the neck and appendages and was normal.  Reflexes - The patient's reflexes were symmetrical in all extremities and he had no  pathological reflexes.  Sensory - Light touch, temperature/pinprick were assessed and were symmetrical.    Coordination - The patient had normal movements in the left hand with no ataxia or dysmetria.  Tremor was absent.  Gait and Station - deferred.   ASSESSMENT/PLAN Jacob Short is a 74 y.o. male with history of diabetes, hypertension, peripheral vascular disease, abdominal aortic aneurysm presenting with R arm weakness, neglect.   Stroke:   L corona radiata infarct secondary to small vessel disease source  CT head No acute abnormality. Old R corona radiata lacune. ASPECTS 10.     CTA head & neck no LVO. Moderate atherosclerosis   CT perfusion no core. L temporal lobe w/ 71mm likely artifactual  MRI 9/23  Acute L corona  radiata infarct   MRI 9/24 no change  2D Echo EF 55-60%. No source of embolus   LDL 38  HgbA1c 6.9  VTE prophylaxis - Lovenox 40 mg sq daily   aspirin 325 mg daily and clopidogrel 75 mg daily prior to admission, now on aspirin 325 mg daily and clopidogrel 75 mg daily. Given failure of aspirin and plavix, recommend treatment with aspirin 81 and Brilinta 90 bid x 30 days then transition back to aspirin and plavix. insurance card availalbe via TOC $5 for 30d supply   Therapy recommendations:  CIR  Disposition:  pending   Hypertension  Stable on the high end . Permissive hypertension (OK if < 220/120) but gradually normalize in 2-3 days . Long-term BP goal normotensive  Hyperlipidemia  Home meds:  crestor 10  On lipitor 80 in hospital  LDL 38, goal < 70  Continue statin at discharge  Diabetes type II Controlled  HgbA1c 6.9, goal < 7.0  CBGs  SSI  PCP follow up  Tobacco abuse  Current smoker  Smoking cessation counseling provided  Nicotine patch provided  Pt is willing to quit  Other Stroke Risk Factors  Advanced age  ETOH use, alcohol level <10, advised to drink no more than 2 drink(s) a day  Coronary artery disease s/p PCI  AAA s/p repair 2018  PAD w/ stent in leg  Other Active Problems  Iliac artery aneurysm  Hospital day # 1  Neurology will sign off. Please call with questions. Pt will follow up with stroke clinic NP at [facility] in about 4 weeks. Thanks for the consult.  Rosalin Hawking, MD PhD Stroke Neurology 06/16/2020 5:35 PM   To contact Stroke Continuity provider, please refer to [email or web address] After hours, contact General Neurology

## 2020-06-16 NOTE — Progress Notes (Signed)
Inpatient Rehab Admissions:  Inpatient Rehab Consult received.  I met with patient and his wife at the bedside for rehabilitation assessment and to discuss goals and expectations of an inpatient rehab admission.  Both are hopeful for CIR for pt to maximize independence prior to returning home.  Feel pt can reach mod I level for most activities, with occasional supervision for higher level tasks.  Will start insurance auth today and f/u with pt on Monday.   Signed: Shann Medal, PT, DPT Admissions Coordinator [phone] 06/16/20  4:49 PM

## 2020-06-16 NOTE — Progress Notes (Addendum)
Rehab Admissions Coordinator Note:  Patient was screened by Cleatrice Burke for appropriateness for an Inpatient Acute Rehab Consult per OT recs.   At this time, we are recommending Inpatient Rehab consult. I will contact MD for order.  Cleatrice Burke RN MSN 06/16/2020, 9:38 AM  I can be reached at [phone].

## 2020-06-16 NOTE — Evaluation (Signed)
Occupational Therapy Evaluation Patient Details Name: Jacob Short MRN: [id number] DOB: 17-Feb-1946 Today's Date: 06/16/2020    History of Present Illness Pt is a 74 yo male admitted with R side weakness.  Pt found to have a L corona radiata infarct.  Pt with h/o AAA, PVD, and DMII.   Clinical Impression   Pt admitted with the above diagnosis and has the deficits listed below. Pt would benefit from cont OT to increase functional use of RUE, increase independence with basic adls using compensatory techniques and increase safety with adl transfers so pt can safely d/c home with his wife.  Pt was independent and is very motivated. Pt would benefit from rehab prior to returning home. Will follow with focus on RUE rehab and adls.     Follow Up Recommendations  CIR;Supervision/Assistance - 24 hour    Equipment Recommendations  3 in 1 bedside commode    Recommendations for Other Services Rehab consult     Precautions / Restrictions Precautions Precautions: Fall Restrictions Weight Bearing Restrictions: No      Mobility Bed Mobility Overal bed mobility: Needs Assistance Bed Mobility: Supine to Sit;Sit to Supine     Supine to sit: Mod assist Sit to supine: Min assist   General bed mobility comments: head of bed was elevated.    Transfers Overall transfer level: Needs assistance Equipment used: Rolling walker (2 wheeled);1 person hand held assist Transfers: Sit to/from [facility] Sit to Stand: Mod assist Stand pivot transfers: Mod assist       General transfer comment: Pt R leg buckles.    Balance Overall balance assessment: Needs assistance Sitting-balance support: Feet supported;Single extremity supported Sitting balance-Leahy Scale: Fair Sitting balance - Comments: once pt sat for appx 3 minutes, pt maintainted static balance with supervision.   Standing balance support: During functional activity;Single extremity supported Standing balance-Leahy  Scale: Poor Standing balance comment: Pt relies on heavy outside support to stand.  Used walker and therapist supported RUE                           ADL either performed or assessed with clinical judgement   ADL Overall ADL's : Needs assistance/impaired Eating/Feeding: Minimal assistance;Sitting;Cueing for compensatory techinques Eating/Feeding Details (indicate cue type and reason): must use LUE to feed self and is R dominant. Grooming: Dance movement psychotherapist;Wash/dry hands;Oral care;Minimal assistance;Sitting;Cueing for compensatory techniques Grooming Details (indicate cue type and reason): pt does well but needs cues to use LUE for now. Upper Body Bathing: Sitting;Moderate assistance;Cueing for compensatory techniques Upper Body Bathing Details (indicate cue type and reason): min assist to get to all areas with  no functional use of RUE and min guard for balance. Lower Body Bathing: Sit to/from stand;Moderate assistance;Cueing for compensatory techniques Lower Body Bathing Details (indicate cue type and reason): sit to stand with min assist but mod assist needed to maintain balance in standing. Upper Body Dressing : Moderate assistance;Sitting;Cueing for compensatory techniques   Lower Body Dressing: Maximal assistance;Sit to/from stand;Cueing for compensatory techniques Lower Body Dressing Details (indicate cue type and reason): balance in sitting and standing limited when challengd. Toilet Transfer: Moderate assistance;Stand-pivot;BSC;RW Toilet Transfer Details (indicate cue type and reason): used walker with therapist assisting with [place] and Hygiene: Moderate assistance;Sit to/from stand;Cueing for compensatory techniques       Functional mobility during ADLs: Moderate assistance;Rolling walker General ADL Comments: Pt very limited due to no functional use of RUE and LLE is weaker  than LLE affecting balance and safety.     Vision Baseline  Vision/History: No visual deficits Patient Visual Report: No change from baseline Vision Assessment?: No apparent visual deficits     Perception Perception Perception Tested?: Yes Comments: Pt tended to RUE well from time therapist entered room.  When he gets going with mobility, he will forget to tend to it at times but no overt R inattn.   Praxis Praxis Praxis tested?: Not tested    Pertinent Vitals/Pain Pain Assessment: No/denies pain     Hand Dominance Right   Extremity/Trunk Assessment Upper Extremity Assessment Upper Extremity Assessment: RUE deficits/detail RUE Deficits / Details: Shoulder 1/5, biceps 1/5, triceps 2/5, wrist/hand 0/5 RUE Sensation: WNL RUE Coordination: decreased fine motor;decreased gross motor   Lower Extremity Assessment Lower Extremity Assessment: Defer to PT evaluation   Cervical / Trunk Assessment Cervical / Trunk Assessment: Normal   Communication Communication Communication: No difficulties   Cognition Arousal/Alertness: Awake/alert Behavior During Therapy: WFL for tasks assessed/performed Overall Cognitive Status: Within Functional Limits for tasks assessed                                 General Comments: Pt very motivated and engaged.   General Comments  Pt appears cognitively intact and is very motivated. Pt would be an excellent rehab candidate as he has a healthy wife to assist at d/c.    Exercises     Shoulder Instructions      Home Living Family/patient expects to be discharged to:: Private residence Living Arrangements: Spouse/significant other Available Help at Discharge: Family;Available 24 hours/day Type of Home: House Home Access: Stairs to enter [employer] of Steps: 3 Entrance Stairs-Rails: Right;Left Home Layout: One level     Bathroom Shower/Tub: Walk-in shower;Door   [employer] Toilet: Standard     Home Equipment: Shower seat          Prior Functioning/Environment Level of  Independence: Independent        Comments: Pt fully independent PTA        OT Problem List: Decreased strength;Decreased range of motion;Impaired balance (sitting and/or standing);Decreased coordination;Decreased knowledge of use of DME or AE;Impaired tone;Impaired UE functional use      OT Treatment/Interventions: Self-care/ADL training;Neuromuscular education;Therapeutic activities;Balance training    OT Goals(Current goals can be found in the care plan section) Acute Rehab OT Goals Patient Stated Goal: to get my strength and independence back. OT Goal Formulation: With patient Time For Goal Achievement: 06/30/20 Potential to Achieve Goals: Good ADL Goals Pt Will Perform Eating: with supervision;sitting Pt Will Perform Grooming: with supervision;sitting Pt Will Perform Upper Body Dressing: with set-up;sitting Pt Will Perform Lower Body Dressing: with supervision;sit to/from stand Pt Will Perform Toileting - Clothing Manipulation and hygiene: with supervision;sit to/from stand Pt Will Perform Tub/Shower Transfer: Shower transfer;with min assist;shower seat;ambulating  OT Frequency: Min 3X/week   Barriers to D/C:    supportive wife at home       Co-evaluation              AM-PAC OT "6 Clicks" Daily Activity     Outcome Measure Help from another person eating meals?: A Little Help from another person taking care of personal grooming?: A Little Help from another person toileting, which includes using toliet, bedpan, or urinal?: A Lot Help from another person bathing (including washing, rinsing, drying)?: A Lot Help from another person to put on and taking off regular  upper body clothing?: A Lot Help from another person to put on and taking off regular lower body clothing?: A Lot 6 Click Score: 14   End of Session Equipment Utilized During Treatment: Surveyor, mining Communication: Mobility status  Activity Tolerance: Patient tolerated treatment well Patient  left: in bed;Other (comment) (pt transported to ECHO)  OT Visit Diagnosis: Unsteadiness on feet (R26.81);Hemiplegia and hemiparesis Hemiplegia - Right/Left: Right Hemiplegia - dominant/non-dominant: Dominant Hemiplegia - caused by: Cerebral infarction                Time: 0832-0903 OT Time Calculation (min): 31 min Charges:  OT General Charges $OT Visit: 1 Visit OT Evaluation $OT Eval Moderate Complexity: 1 Mod OT Treatments $Self Care/Home Management : 8-22 mins  Glenford Peers 06/16/2020, 9:23 AM

## 2020-06-16 NOTE — Progress Notes (Addendum)
HD#1 Subjective:  Overnight Events: none  Patient evaluate at bedside this morning. Patient sitting up in felling well. Continues to have weakness in the right arm and has not been able to move it. Otherwise he is feeling well. He expresses desire to stop smoking.  Objective:  Vital signs in last 24 hours: Vitals:   06/15/20 2010 06/15/20 2319 06/16/20 0410 06/16/20 0500  BP: (!) 183/95 (!) 154/97 (!) 183/103   Pulse: 84 71 74   Resp: 20 11 11    Temp: 99 F (37.2 C) 97.8 F (36.6 C) 97.7 F (36.5 C)   TempSrc: Oral Oral Oral   SpO2: 97% 97% 97%   Weight:    82.1 kg  Height:       Supplemental O2: Room Air SpO2: 97 %   Physical Exam:  Physical Exam Constitutional:      Appearance: Normal appearance.  Cardiovascular:     Rate and Rhythm: Normal rate and regular rhythm.  Neurological:     Mental Status: He is alert and oriented to person, place, and time.     Comments: 0/5 strength of RUE 5/5 strength of LUE and bilateral LE, right facial asymmetry with associated dysarthria, no aphasia     Filed Weights   06/15/20 1714 06/16/20 0500  Weight: 83.5 kg 82.1 kg     Intake/Output Summary (Last 24 hours) at 06/16/2020 0630 Last data filed at 06/15/2020 2010 Gross per 24 hour  Intake --  Output 225 ml  Net -225 ml   Net IO Since Admission: -225 mL [06/16/20 0630]  Pertinent Labs: CBC Latest Ref Rng & Units 06/16/2020 06/15/2020 06/15/2020  WBC 4.0 - 10.5 K/uL 10.9(H) - 8.2  Hemoglobin 13.0 - 17.0 g/dL 15.0 16.3 15.1  Hematocrit 39 - 52 % 45.4 48.0 46.9  Platelets 150 - 400 K/uL 237 - 236    CMP Latest Ref Rng & Units 06/16/2020 06/15/2020 06/15/2020  Glucose 70 - 99 mg/dL 128(H) 158(H) 157(H)  BUN 8 - 23 mg/dL 9 6(L) 7(L)  Creatinine 0.61 - 1.24 mg/dL 0.57(L) 0.50(L) 0.64  Sodium 135 - 145 mmol/L 140 140 138  Potassium 3.5 - 5.1 mmol/L 3.7 4.4 4.7  Chloride 98 - 111 mmol/L 103 104 103  CO2 22 - 32 mmol/L 24 - 22  Calcium 8.9 - 10.3 mg/dL 9.0 - 9.2  Total  Protein 6.5 - 8.1 g/dL - - 7.0  Total Bilirubin 0.3 - 1.2 mg/dL - - 1.5(H)  Alkaline Phos 38 - 126 U/L - - 80  AST 15 - 41 U/L - - 16  ALT 0 - 44 U/L - - 12    Imaging: CT head: No acute finding ASPECT is 10, chronic lacunar infarct of the right corona radiata  CTA: No emergent finding without large vessel occulusion or stenosis, moderate atherosclerosis in head and neck  CT perfusion: No detected infarct. There is a 7 cc area highlighted in the inferior left temporal lobe, possibly artifactual given the provided history and location along the skull base.  MRI brain: Acute lacunar infarct of the left corona radiata  Assessment/Plan:   Principal Problem:   Cerebral infarction [facility]) Active Problems:   Tobacco use disorder   Peripheral artery disease (HCC)   Diabetes ([place])   AAA (abdominal aortic aneurysm) without rupture [facility])   Patient Summary: This is a 74 year old male with a history of AAA status post repair in 2018, diabetes mellitus type 2, hypertension, CAD, iliac arterial aneurysm, carotid artery  stenosis, PAD, and tobacco use who presented with right facial droop and right arm weakness admitted for acute lacunar infart of the left corona radiata.  Acute Cerebral Infarction of the Left Corona Radiata: Patient presented with 1 day history of right facial droop and right arm weakness. Physical exam with worsening strength of right upper extremity with inability to move the right arm or grip compared to exam yesterday. Concern for stroke extension versus complete stroke. Discussed with Dr. Erlinda Hong recommending limited MRI with DWI which was without significant change in the left corona radiata. TTE with EF of 55-60% with no valvular abnormalities or intracardiac source of embolism. - Allow for permissive HTN, goal systolic <220 and diastolic <254 - ASA 270 mg, will discuss changing this to 81mg  daily with stroke team - Continue home plavix 75 mg daily, may to brillianta on  discharge. - Continue atorvastatin 80 mg daily - Tele monitoring  - SLP eval - PT/OT: recommending CIR - PM&R: will evaluated patient for CIR  CAD: PAD, PVD: AAA s/p repair: Seen by vascular surgery 9/9, noted increasing difficulties with instability and falling, walking daily, no other complaints.  At that time noted he could stop his Plavix and just continue the aspirin daily. Patient reported that he was still taking plavix daily. Will resume his ASA and plavix for now in setting of acute CVA however does not need to be on plavix for his endograft or carotid stenosis.  -Continue home ASA and plavix  Hypertension: Allowing for permissive HTN in setting of acute CVA. On losartan 25 mg daily at home.   Diabetes mellitus: On metformin 1000 mg in AM and 1500 mg in PM. A1c 6.9.  -Monitor CBGs  Tobacco use: Smokes 1 ppd, trying to quit. Discussed smoking cessation.  -Encourage smoking cessation -Nicotine patch while here  Diet: Heart Healthy IVF: None,None VTE: Enoxaparin Code: Full PT/OT recs: CIR, none.  Dispo: Anticipated discharge to Rehab in 1-2 days pending medical management.  Iona Beard, MD 06/16/2020, 6:30 AM Pager: [phone]  Please contact the on call pager after 5 pm and on weekends at [phone].   Internal Medicine Attending:   I saw and examined the patient. I reviewed the resident's note and I agree with the resident's findings and plan as documented in the resident's note.  Lalla Brothers, MD

## 2020-06-17 LAB — GLUCOSE, CAPILLARY
Glucose-Capillary: 171 mg/dL — ABNORMAL HIGH (ref 70–99)
Glucose-Capillary: 194 mg/dL — ABNORMAL HIGH (ref 70–99)
Glucose-Capillary: 190 mg/dL — ABNORMAL HIGH (ref 70–99)
Glucose-Capillary: 200 mg/dL — ABNORMAL HIGH (ref 70–99)

## 2020-06-17 LAB — BASIC METABOLIC PANEL
Anion gap: 9 (ref 5–15)
BUN: 8 mg/dL (ref 8–23)
CO2: 23 mmol/L (ref 22–32)
Calcium: 8.9 mg/dL (ref 8.9–10.3)
Chloride: 106 mmol/L (ref 98–111)
Creatinine, Ser: 0.64 mg/dL (ref 0.61–1.24)
GFR calc Af Amer: >60 mL/min (ref >60)
GFR calc non Af Amer: >60 mL/min (ref >60)
Glucose, Bld: 174 mg/dL — ABNORMAL HIGH (ref 70–99)
Potassium: 3.5 mmol/L (ref 3.5–5.1)
Sodium: 138 mmol/L (ref 135–145)

## 2020-06-17 LAB — CBC
HCT: 46.8 % (ref 39.0–52.0)
Hemoglobin: 15.5 g/dL (ref 13.0–17.0)
MCH: 31.8 pg (ref 26.0–34.0)
MCHC: 33.1 g/dL (ref 30.0–36.0)
MCV: 95.9 fL (ref 80.0–100.0)
Platelets: 228 10*3/uL (ref 150–400)
RBC: 4.88 MIL/uL (ref 4.22–5.81)
RDW: 13.8 % (ref 11.5–15.5)
WBC: 10.1 10*3/uL (ref 4.0–10.5)
nRBC: 0 % (ref 0.0–0.2)

## 2020-06-17 MED ORDER — LOSARTAN POTASSIUM 25 MG PO TABS
25.0000 mg | ORAL_TABLET | Freq: Every day | ORAL | Status: DC
  Administered 2020-06-17 – 2020-06-19 (×3): 25 mg via ORAL
  Filled 2020-06-17 (×4): qty 1

## 2020-06-17 MED ORDER — METFORMIN HCL 500 MG PO TABS
1500.0000 mg | ORAL_TABLET | Freq: Every day | ORAL | Status: DC
  Administered 2020-06-17 – 2020-06-18 (×2): 1500 mg via ORAL
  Filled 2020-06-17 (×2): qty 3

## 2020-06-17 MED ORDER — METFORMIN HCL 500 MG PO TABS
1000.0000 mg | ORAL_TABLET | Freq: Every day | ORAL | Status: DC
  Administered 2020-06-18 – 2020-06-19 (×2): 1000 mg via ORAL
  Filled 2020-06-17 (×3): qty 2

## 2020-06-17 MED ORDER — METFORMIN HCL 500 MG PO TABS: 1000.0000 mg | ORAL_TABLET | ORAL | Status: DC

## 2020-06-17 NOTE — Progress Notes (Signed)
Physical Therapy Treatment Patient Details Name: Jacob Short MRN: [id number] DOB: [birth year] Today's Date: 06/17/2020    History of Present Illness Pt is a 74 yo male admitted with R sided weakness.  Pt found to have a L corona radiata infarct.  PMH includes AAA, PVD, and DMII.    PT Comments    Pt is progressing well towards goals and very motivated to participate. At the end of session he asked if therapy could please do their very best to come back again tomorrow. Pt performed sit<>stand and SPT to recliner chair, where he sat prematurely despite cueing. He was able to ambulate in hallway with assist at the R LE to prevent buckling. Continue to feel pt would be an excellent candidate for CIR. Will continue to follow acutely.    Follow Up Recommendations  CIR     Equipment Recommendations  None recommended by PT    Recommendations for Other Services Rehab consult     Precautions / Restrictions Precautions Precautions: Fall Precaution Comments: R hemiparesis UE>LE    Mobility  Bed Mobility Overal bed mobility: Needs Assistance Bed Mobility: Supine to Sit     Supine to sit: Min assist     General bed mobility comments: min A to progress LEs and elevate trunk. Once up unable to scoot forward. Mod A to progress hips to EOB  Transfers Overall transfer level: Needs assistance Equipment used: 1 person hand held assist Transfers: Sit to/from [facility] Sit to Stand: Min assist Stand pivot transfers: Mod assist       General transfer comment: Pt able to stand from with min A. SPT to recliner via face to face required additional assist as R LE tends to buckle. Pt prematurally sitting into recliner chair.   Ambulation/Gait Ambulation/Gait assistance: +2 safety/equipment;Mod assist Gait Distance (Feet): 15 Feet (x3) Assistive device: 1 person hand held assist (+ LUE using chair rail in hallway as external support) Gait Pattern/deviations: Step-to  pattern;Step-through pattern;Decreased stride length;Trunk flexed;Narrow base of support;Decreased stance time - right Gait velocity: decr   General Gait Details: Light mod A to ambulate in hallway. Pt requiring blocking at R knee during stance phase and assist for balance. VC to widen BOS.   Stairs             Wheelchair Mobility    Modified Rankin (Stroke Patients Only) Modified Rankin (Stroke Patients Only) Pre-Morbid Rankin Score: No symptoms Modified Rankin: Moderately severe disability     Balance Overall balance assessment: Needs assistance Sitting-balance support: Feet supported;Single extremity supported Sitting balance-Leahy Scale: Fair     Standing balance support: During functional activity;Single extremity supported Standing balance-Leahy Scale: Poor Standing balance comment: reliant on external support                            Cognition Arousal/Alertness: Awake/alert Behavior During Therapy: WFL for tasks assessed/performed Overall Cognitive Status: Within Functional Limits for tasks assessed                                 General Comments: Pt very motivated and engaged, dry sense of humor and quick-witted.      Exercises General Exercises - Lower Extremity Quad Sets: AROM;Right;5 reps;Supine (+ 2 second hold in knee extension, external PT cue behind pt knee) Other Exercises Other Exercises: sit<>stand 10x for neuromuscular re-ed    General Comments  Pertinent Vitals/Pain Pain Assessment: No/denies pain    Home Living                      Prior Function            PT Goals (current goals can now be found in the care plan section) Acute Rehab PT Goals Patient Stated Goal: to get my strength and independence back PT Goal Formulation: With patient/family Time For Goal Achievement: 06/30/20 Potential to Achieve Goals: Good Progress towards PT goals: Progressing toward goals    Frequency     Min 4X/week      PT Plan Current plan remains appropriate    Co-evaluation              AM-PAC PT "6 Clicks" Mobility   Outcome Measure  Help needed turning from your back to your side while in a flat bed without using bedrails?: A Little Help needed moving from lying on your back to sitting on the side of a flat bed without using bedrails?: A Little Help needed moving to and from a bed to a chair (including a wheelchair)?: A Lot Help needed standing up from a chair using your arms (e.g., wheelchair or bedside chair)?: A Little Help needed to walk in hospital room?: A Lot Help needed climbing 3-5 steps with a railing? : Total 6 Click Score: 14    End of Session Equipment Utilized During Treatment: Gait belt Activity Tolerance: Patient tolerated treatment well Patient left: in chair;with chair alarm set Nurse Communication: Mobility status PT Visit Diagnosis: Other abnormalities of gait and mobility (R26.89);Hemiplegia and hemiparesis Hemiplegia - Right/Left: Right Hemiplegia - dominant/non-dominant: Dominant Hemiplegia - caused by: Cerebral infarction     Time: 1333-1400 PT Time Calculation (min) (ACUTE ONLY): 27 min  Charges:  $Neuromuscular Re-education: 23-37 mins                     Benjiman Core, [place] Pager [phone] Acute Rehab   Allena Katz 06/17/2020, 2:11 PM

## 2020-06-17 NOTE — Progress Notes (Signed)
HD#2 Subjective:  Overnight Events: None   Patient evaluated at the bedside. Patient doing well this morning. Reports that he is still unable to move his right arm. Was able to work with PT yesterday and felt it went well. Discussed plan for CIR which he is agreeable to.  Objective:  Vital signs in last 24 hours: Vitals:   06/16/20 2317 06/17/20 0028 06/17/20 0451 06/17/20 0500  BP: (!) 187/79  (!) 153/72   Pulse: (!) 40  93   Resp: 14  (!) 24   Temp:   98 F (36.7 C)   TempSrc:   Oral   SpO2: 99%  99%   Weight:  82.1 kg  84 kg  Height:  6\' 4"  (1.93 m)     Supplemental O2: Room Air SpO2: 99 %   Physical Exam:  Physical Exam Constitutional:      Appearance: Normal appearance.  Cardiovascular:     Rate and Rhythm: Normal rate and regular rhythm.  Skin:    Findings: No erythema or rash.  Neurological:     Mental Status: He is alert and oriented to person, place, and time. Mental status is at baseline.     Comments: RUE weakness 1/5, 4+/5 strength of RLE. Normal strength on left. Mild right facial droop      Filed Weights   06/16/20 0500 06/17/20 0028 06/17/20 0500  Weight: 82.1 kg 82.1 kg 84 kg     Intake/Output Summary (Last 24 hours) at 06/17/2020 0606 Last data filed at 06/17/2020 0240 Gross per 24 hour  Intake 800 ml  Output 1620 ml  Net -820 ml   Net IO Since Admission: -1,045 mL [06/17/20 0606]  Pertinent Labs: CBC Latest Ref Rng & Units 06/17/2020 06/16/2020 06/15/2020  WBC 4.0 - 10.5 K/uL 10.1 10.9(H) -  Hemoglobin 13.0 - 17.0 g/dL 15.5 15.0 16.3  Hematocrit 39 - 52 % 46.8 45.4 48.0  Platelets 150 - 400 K/uL 228 237 -    CMP Latest Ref Rng & Units 06/17/2020 06/16/2020 06/15/2020  Glucose 70 - 99 mg/dL 174(H) 128(H) 158(H)  BUN 8 - 23 mg/dL 8 9 6(L)  Creatinine 0.61 - 1.24 mg/dL 0.64 0.57(L) 0.50(L)  Sodium 135 - 145 mmol/L 138 140 140  Potassium 3.5 - 5.1 mmol/L 3.5 3.7 4.4  Chloride 98 - 111 mmol/L 106 103 104  CO2 22 - 32 mmol/L 23 24 -    Calcium 8.9 - 10.3 mg/dL 8.9 9.0 -  Total Protein 6.5 - 8.1 g/dL - - -  Total Bilirubin 0.3 - 1.2 mg/dL - - -  Alkaline Phos 38 - 126 U/L - - -  AST 15 - 41 U/L - - -  ALT 0 - 44 U/L - - -    Imaging: MR BRAIN WO CONTRAST  Result Date: 06/16/2020 CLINICAL DATA:  Right-sided weakness. EXAM: MRI HEAD WITHOUT CONTRAST TECHNIQUE: Multiplanar, multiecho pulse sequences of the brain and surrounding structures were obtained without intravenous contrast. COMPARISON:  MRI of the brain June 15, 2020 FINDINGS: Brain: No significant change of the focus of acute/subacute infarct in the left corona radiata. No new focus of restricted diffusion seen. Again noted susceptibility artifact in the left parasagittal posterior frontal lobe, suggesting hemosiderin deposit. Vascular: Normal flow voids. Skull and upper cervical spine: Negative Sinuses/Orbits: Negative Other: None. IMPRESSION: No significant change of the focus of acute/subacute infarct in the left corona radiata. No new focus of restricted diffusion identified. Electronically Signed   By: Erven Colla  Karenann Cai M.D.   On: 06/16/2020 12:55   ECHOCARDIOGRAM COMPLETE  Result Date: 06/16/2020    ECHOCARDIOGRAM REPORT   Patient Name:   Jacob Short Date of Exam: 06/16/2020 Medical Rec #:  [id number]       Height:       76.0 in Accession #:    [id number]      Weight:       181.0 lb Date of Birth:  1946/04/02        BSA:          2.123 m Patient Age:    74 years        BP:           183/103 mmHg Patient Gender: M               HR:           59 bpm. Exam Location:  Inpatient Procedure: 2D Echo, Cardiac Doppler and Color Doppler Indications:    Stroke 434.91 / I163.9  History:        Patient has no prior history of Echocardiogram examinations.                 CAD; Risk Factors:Diabetes.  Sonographer:    Bernadene Person RDCS Referring Phys: [id number] [place]  1. Left ventricular ejection fraction, by estimation, is 55 to 60%. The  left ventricle has normal function. The left ventricle has no regional wall motion abnormalities. Left ventricular diastolic parameters are consistent with Grade I diastolic dysfunction (impaired relaxation).  2. Right ventricular systolic function is normal. The right ventricular size is normal. Tricuspid regurgitation signal is inadequate for assessing PA pressure.  3. The mitral valve is grossly normal. Trivial mitral valve regurgitation. No evidence of mitral stenosis.  4. The aortic valve is tricuspid. Aortic valve regurgitation is not visualized. No aortic stenosis is present.  5. The inferior vena cava is normal in size with greater than 50% respiratory variability, suggesting right atrial pressure of 3 mmHg. Conclusion(s)/Recommendation(s): No intracardiac source of embolism detected on this transthoracic study. A transesophageal echocardiogram is recommended to exclude cardiac source of embolism if clinically indicated. FINDINGS  Left Ventricle: Left ventricular ejection fraction, by estimation, is 55 to 60%. The left ventricle has normal function. The left ventricle has no regional wall motion abnormalities. The left ventricular internal cavity size was normal in size. There is  no left ventricular hypertrophy. Left ventricular diastolic parameters are consistent with Grade I diastolic dysfunction (impaired relaxation). Normal left ventricular filling pressure. Right Ventricle: The right ventricular size is normal. No increase in right ventricular wall thickness. Right ventricular systolic function is normal. Tricuspid regurgitation signal is inadequate for assessing PA pressure. Left Atrium: Left atrial size was normal in size. Right Atrium: Right atrial size was normal in size. Prominent Eustachian valve. Pericardium: Trivial pericardial effusion is present. Mitral Valve: The mitral valve is grossly normal. Trivial mitral valve regurgitation. No evidence of mitral valve stenosis. Tricuspid Valve: The  tricuspid valve is grossly normal. Tricuspid valve regurgitation is trivial. No evidence of tricuspid stenosis. Aortic Valve: The aortic valve is tricuspid. Aortic valve regurgitation is not visualized. No aortic stenosis is present. Pulmonic Valve: The pulmonic valve was grossly normal. Pulmonic valve regurgitation is not visualized. No evidence of pulmonic stenosis. Aorta: The aortic root and ascending aorta are structurally normal, with no evidence of dilitation. Venous: The inferior vena cava is normal in size with greater than 50% respiratory variability, suggesting right  atrial pressure of 3 mmHg. IAS/Shunts: The atrial septum is grossly normal. EKG: Rhythm strip during this exam demostrated normal sinus rhythm and premature ventricular contractions.  LEFT VENTRICLE PLAX 2D LVIDd:         5.40 cm      Diastology LVIDs:         3.50 cm      LV e' medial:    4.30 cm/s LV PW:         1.30 cm      LV E/e' medial:  11.9 LV IVS:        1.00 cm      LV e' lateral:   7.54 cm/s LVOT diam:     2.40 cm      LV E/e' lateral: 6.8 LV SV:         92 LV SV Index:   43 LVOT Area:     4.52 cm  LV Volumes (MOD) LV vol d, MOD A2C: 118.0 ml LV vol d, MOD A4C: 118.0 ml LV vol s, MOD A2C: 65.3 ml LV vol s, MOD A4C: 66.1 ml LV SV MOD A2C:     52.7 ml LV SV MOD A4C:     118.0 ml LV SV MOD BP:      53.6 ml RIGHT VENTRICLE RV S prime:     7.14 cm/s TAPSE (M-mode): 1.6 cm LEFT ATRIUM             Index       RIGHT ATRIUM           Index LA diam:        3.10 cm 1.46 cm/m  RA Area:     14.50 cm LA Vol (A2C):   80.5 ml 37.91 ml/m RA Volume:   33.10 ml  15.59 ml/m LA Vol (A4C):   43.1 ml 20.30 ml/m LA Biplane Vol: 61.2 ml 28.82 ml/m  AORTIC VALVE LVOT Vmax:   95.50 cm/s LVOT Vmean:  64.750 cm/s LVOT VTI:    0.202 m  AORTA Ao Root diam: 3.50 cm Ao Asc diam:  3.30 cm MITRAL VALVE MV Area (PHT): 1.91 cm    SHUNTS MV Decel Time: 398 msec    Systemic VTI:  0.20 m MV E velocity: 51.00 cm/s  Systemic Diam: 2.40 cm MV A velocity: 67.90 cm/s  MV E/A ratio:  0.75 Eleonore Chiquito MD Electronically signed by Eleonore Chiquito MD Signature Date/Time: 06/16/2020/10:58:42 AM    Final     Assessment/Plan:   Principal Problem:   Cerebral infarction [facility]) Active Problems:   Tobacco use disorder   Peripheral artery disease ([place])   Diabetes ([place])   AAA (abdominal aortic aneurysm) without rupture [facility])   Patient Summary: This is a 74 year old male with a history ofAAA status post repair in 2018, diabetes mellitus type 2, hypertension,CAD,iliac arterial aneurysm, carotid artery stenosis, PAD, and tobacco use who presented with right facial droop and right arm weakness admitted for acute lacunar infart of the left corona radiata.  Acute Cerebral Infarction of the Left Corona Radiata: Patient continues to have RUE weakness appears stable since yesterday with improvement in right facial droop. No change on repeat MRI. Echo negative for embolus. Etiology likely due to small vessel disease. -Continue ASA 81 mg daily and Brilinta 90 mg BID for 30 days before transitioning back to Asprin and plavix - Continue atorvastatin 80 mg daily - Tele monitoring  - PM&R: evaluated and patient appropriate for CIR, pending insurance authorization  CAD: PAD, PVD: AAA s/p repair: Seen by vascular surgery 9/9, noted increasing difficulties with instability and falling, walking daily, no other complaints.  -Continue home ASA and brillinta  Hypertension: BP 158/82 today. Allowed for permissive hypertension for the past 2 days. - restart losartan 25 mg.   Diabetes mellitus: On metformin 1000 mg in AM and 1500 mg in PM at home. A1c 6.9.  -Monitor CBGs -Start home metformin  Tobacco use: Smokes 1 ppd, trying to quit. Discussed smoking cessation.  -Continue to encourage smoking cessation -Nicotine patch while here  Diet: Heart Healthy IVF: None,None VTE: Enoxaparin Code: Full PT/OT recs: CIR  Dispo: Anticipated discharge to Rehab in 2 days  pending placement.  Iona Beard, MD 06/17/2020, 6:06 AM Pager: [phone]  Please contact the on call pager after 5 pm and on weekends at [phone].

## 2020-06-18 DIAGNOSIS — Z7984 Long term (current) use of oral hypoglycemic drugs: Secondary | ICD-10-CM

## 2020-06-18 DIAGNOSIS — Z95828 Presence of other vascular implants and grafts: Secondary | ICD-10-CM

## 2020-06-18 DIAGNOSIS — Z720 Tobacco use: Secondary | ICD-10-CM

## 2020-06-18 DIAGNOSIS — E119 Type 2 diabetes mellitus without complications: Secondary | ICD-10-CM

## 2020-06-18 DIAGNOSIS — I2510 Atherosclerotic heart disease of native coronary artery without angina pectoris: Secondary | ICD-10-CM

## 2020-06-18 DIAGNOSIS — I6389 Other cerebral infarction: Secondary | ICD-10-CM

## 2020-06-18 LAB — CBC
HCT: 43.7 % (ref 39.0–52.0)
Hemoglobin: 14.7 g/dL (ref 13.0–17.0)
MCH: 32.9 pg (ref 26.0–34.0)
MCHC: 33.6 g/dL (ref 30.0–36.0)
MCV: 97.8 fL (ref 80.0–100.0)
Platelets: 228 10*3/uL (ref 150–400)
RBC: 4.47 MIL/uL (ref 4.22–5.81)
RDW: 13.9 % (ref 11.5–15.5)
WBC: 9.2 10*3/uL (ref 4.0–10.5)
nRBC: 0 % (ref 0.0–0.2)

## 2020-06-18 LAB — GLUCOSE, CAPILLARY: Glucose-Capillary: 174 mg/dL — ABNORMAL HIGH (ref 70–99)

## 2020-06-18 LAB — BASIC METABOLIC PANEL WITH GFR
Anion gap: 9 (ref 5–15)
BUN: 9 mg/dL (ref 8–23)
CO2: 24 mmol/L (ref 22–32)
Calcium: 8.7 mg/dL — ABNORMAL LOW (ref 8.9–10.3)
Chloride: 103 mmol/L (ref 98–111)
Creatinine, Ser: 0.7 mg/dL (ref 0.61–1.24)
GFR calc Af Amer: >60 mL/min
GFR calc non Af Amer: >60 mL/min
Glucose, Bld: 179 mg/dL — ABNORMAL HIGH (ref 70–99)
Potassium: 3.8 mmol/L (ref 3.5–5.1)
Sodium: 136 mmol/L (ref 135–145)

## 2020-06-18 MED ORDER — INSULIN GLARGINE 100 UNIT/ML ~~LOC~~ SOLN
3.0000 [IU] | Freq: Once | SUBCUTANEOUS | Status: AC
  Administered 2020-06-18: 3 [IU] via SUBCUTANEOUS
  Filled 2020-06-18: qty 0.03

## 2020-06-18 NOTE — Progress Notes (Signed)
  Date: 06/18/2020  Patient name: Jacob Short  Medical record number: [id number]  Date of birth: 05/26/1946   I have seen and evaluated this patient and I have discussed the plan of care with the house staff. Please see their note for complete details.  Lenice Pressman, M.D., Ph.D. 06/18/2020, 3:23 PM

## 2020-06-18 NOTE — Progress Notes (Signed)
HD#3 Subjective:  Overnight Events: none   Patient feeling well this morning continues to have right arm weakness. He was able to work with PT yesterday able to ambulate well in the hallways. Continues to be interested in CIR and rehabilitation to improve RUE function.  Objective:  Vital signs in last 24 hours: Vitals:   06/17/20 1944 06/18/20 0004 06/18/20 0334 06/18/20 0440  BP: (!) 164/86 (!) 156/91 132/71   Pulse: 77 78 86   Resp: 19 19 17    Temp: 98.3 F (36.8 C) 98.2 F (36.8 C) 97.7 F (36.5 C)   TempSrc: Oral Oral Oral   SpO2: 98% 97% 98%   Weight:    85.6 kg  Height:       Supplemental O2: Room Air SpO2: 98 %   Physical Exam:  Physical Exam Constitutional:      Appearance: Normal appearance.  Cardiovascular:     Rate and Rhythm: Normal rate and regular rhythm.  Pulmonary:     Effort: Pulmonary effort is normal.     Breath sounds: Normal breath sounds.  Neurological:     Mental Status: He is alert.     Comments: RUE weakness, mild RLE weakness     Filed Weights   06/17/20 0028 06/17/20 0500 06/18/20 0440  Weight: 82.1 kg 84 kg 85.6 kg     Intake/Output Summary (Last 24 hours) at 06/18/2020 0627 Last data filed at 06/17/2020 1947 Gross per 24 hour  Intake 480 ml  Output 750 ml  Net -270 ml   Net IO Since Admission: -1,315 mL [06/18/20 0627]  Pertinent Labs: CBC Latest Ref Rng & Units 06/18/2020 06/17/2020 06/16/2020  WBC 4.0 - 10.5 K/uL 9.2 10.1 10.9(H)  Hemoglobin 13.0 - 17.0 g/dL 14.7 15.5 15.0  Hematocrit 39 - 52 % 43.7 46.8 45.4  Platelets 150 - 400 K/uL 228 228 237    CMP Latest Ref Rng & Units 06/18/2020 06/17/2020 06/16/2020  Glucose 70 - 99 mg/dL 179(H) 174(H) 128(H)  BUN 8 - 23 mg/dL 9 8 9   Creatinine 0.61 - 1.24 mg/dL 0.70 0.64 0.57(L)  Sodium 135 - 145 mmol/L 136 138 140  Potassium 3.5 - 5.1 mmol/L 3.8 3.5 3.7  Chloride 98 - 111 mmol/L 103 106 103  CO2 22 - 32 mmol/L 24 23 24   Calcium 8.9 - 10.3 mg/dL 8.7(L) 8.9 9.0  Total  Protein 6.5 - 8.1 g/dL - - -  Total Bilirubin 0.3 - 1.2 mg/dL - - -  Alkaline Phos 38 - 126 U/L - - -  AST 15 - 41 U/L - - -  ALT 0 - 44 U/L - - -    Imaging: No results found.  Assessment/Plan:   Principal Problem:   Cerebral infarction [facility]) Active Problems:   Tobacco use disorder   Peripheral artery disease ([facility])   Diabetes ([place])   AAA (abdominal aortic aneurysm) without rupture [facility])  Patient Summary: This is a 74 year old male with a history ofAAA status post repair in 2018, diabetes mellitus type 2, hypertension,CAD,iliac arterial aneurysm, carotid artery stenosis, PAD, and tobacco use who presented with right facial droop and right arm weakness admitted for acute lacunar infart of the left corona radiata.  AcuteCerebral Infarction of the Left Corona Radiata: Patient continues to have RUE weakness.  Etiology likely due to small vessel disease. -Continue ASA 81 mg daily and Brilinta 90 mg BID for 30 days before transitioning back to Asprin and plavix - Continue atorvastatin 80 mg daily -  PM&R: evaluated and patient appropriate for CIR, pending insurance authorization  CAD: PAD, PVD: AAA s/p repair: Seen by vascular surgery 9/9, noted increasing difficulties with instability and falling, walking daily, no other complaints.  -Continue home ASA and brillinta  Hypertension: BP 132/71 today. Restarted on home losartan 25 mg.   Diabetes mellitus: On metformin 1000 mg in AM and 1500 mg in PM at home.A1c 6.9. Renal function stable. -Continue home metformin  Diet:Heart Healthy YHO:OILN,ZVJK QAS:UORVIFBPPH Code:Full PT/OT recs:CIR TOC recs: pending insurance authorization   Dispo: Anticipated discharge to Rehab in 2 days pending placement.   Iona Beard, MD 06/18/2020, 6:27 AM Pager: [phone]  Please contact the on call pager after 5 pm and on weekends at [phone].

## 2020-06-19 ENCOUNTER — Inpatient Hospital Stay (HOSPITAL_COMMUNITY)
Admission: RE | Admit: 2020-06-19 | Discharge: 2020-07-11 | DRG: 057 | Disposition: A | Discharge status: Home / Self Care | Payer: PPO | Source: Intra-hospital | Attending: Physical Medicine & Rehabilitation | Admitting: Physical Medicine & Rehabilitation

## 2020-06-19 ENCOUNTER — Encounter (HOSPITAL_COMMUNITY): Payer: Self-pay | Admitting: Physical Medicine & Rehabilitation

## 2020-06-19 ENCOUNTER — Other Ambulatory Visit: Payer: Self-pay

## 2020-06-19 DIAGNOSIS — I69322 Dysarthria following cerebral infarction: Secondary | ICD-10-CM | POA: Diagnosis not present

## 2020-06-19 DIAGNOSIS — Z79890 Hormone replacement therapy (postmenopausal): Secondary | ICD-10-CM

## 2020-06-19 DIAGNOSIS — I69392 Facial weakness following cerebral infarction: Secondary | ICD-10-CM

## 2020-06-19 DIAGNOSIS — I10 Essential (primary) hypertension: Secondary | ICD-10-CM | POA: Diagnosis not present

## 2020-06-19 DIAGNOSIS — Z7982 Long term (current) use of aspirin: Secondary | ICD-10-CM

## 2020-06-19 DIAGNOSIS — M62838 Other muscle spasm: Secondary | ICD-10-CM

## 2020-06-19 DIAGNOSIS — E8809 Other disorders of plasma-protein metabolism, not elsewhere classified: Secondary | ICD-10-CM | POA: Diagnosis present

## 2020-06-19 DIAGNOSIS — I69351 Hemiplegia and hemiparesis following cerebral infarction affecting right dominant side: Principal | ICD-10-CM

## 2020-06-19 DIAGNOSIS — Z7984 Long term (current) use of oral hypoglycemic drugs: Secondary | ICD-10-CM

## 2020-06-19 DIAGNOSIS — D72829 Elevated white blood cell count, unspecified: Secondary | ICD-10-CM | POA: Diagnosis not present

## 2020-06-19 DIAGNOSIS — E785 Hyperlipidemia, unspecified: Secondary | ICD-10-CM | POA: Diagnosis not present

## 2020-06-19 DIAGNOSIS — D62 Acute posthemorrhagic anemia: Secondary | ICD-10-CM | POA: Diagnosis present

## 2020-06-19 DIAGNOSIS — I714 Abdominal aortic aneurysm, without rupture: Secondary | ICD-10-CM | POA: Diagnosis present

## 2020-06-19 DIAGNOSIS — R252 Cramp and spasm: Secondary | ICD-10-CM | POA: Diagnosis not present

## 2020-06-19 DIAGNOSIS — Z9181 History of falling: Secondary | ICD-10-CM

## 2020-06-19 DIAGNOSIS — Z7902 Long term (current) use of antithrombotics/antiplatelets: Secondary | ICD-10-CM

## 2020-06-19 DIAGNOSIS — Z8673 Personal history of transient ischemic attack (TIA), and cerebral infarction without residual deficits: Secondary | ICD-10-CM | POA: Diagnosis not present

## 2020-06-19 DIAGNOSIS — I679 Cerebrovascular disease, unspecified: Secondary | ICD-10-CM | POA: Diagnosis present

## 2020-06-19 DIAGNOSIS — R0989 Other specified symptoms and signs involving the circulatory and respiratory systems: Secondary | ICD-10-CM | POA: Diagnosis not present

## 2020-06-19 DIAGNOSIS — E871 Hypo-osmolality and hyponatremia: Secondary | ICD-10-CM | POA: Diagnosis present

## 2020-06-19 DIAGNOSIS — Z79899 Other long term (current) drug therapy: Secondary | ICD-10-CM | POA: Diagnosis not present

## 2020-06-19 DIAGNOSIS — E1165 Type 2 diabetes mellitus with hyperglycemia: Secondary | ICD-10-CM | POA: Diagnosis not present

## 2020-06-19 DIAGNOSIS — E1142 Type 2 diabetes mellitus with diabetic polyneuropathy: Secondary | ICD-10-CM | POA: Diagnosis not present

## 2020-06-19 DIAGNOSIS — E1151 Type 2 diabetes mellitus with diabetic peripheral angiopathy without gangrene: Secondary | ICD-10-CM | POA: Diagnosis not present

## 2020-06-19 DIAGNOSIS — Z720 Tobacco use: Secondary | ICD-10-CM | POA: Diagnosis not present

## 2020-06-19 DIAGNOSIS — E441 Mild protein-calorie malnutrition: Secondary | ICD-10-CM | POA: Diagnosis present

## 2020-06-19 DIAGNOSIS — Z87891 Personal history of nicotine dependence: Secondary | ICD-10-CM | POA: Diagnosis not present

## 2020-06-19 DIAGNOSIS — I2510 Atherosclerotic heart disease of native coronary artery without angina pectoris: Secondary | ICD-10-CM | POA: Diagnosis present

## 2020-06-19 DIAGNOSIS — Z6821 Body mass index (BMI) 21.0-21.9, adult: Secondary | ICD-10-CM

## 2020-06-19 DIAGNOSIS — G8191 Hemiplegia, unspecified affecting right dominant side: Secondary | ICD-10-CM | POA: Diagnosis not present

## 2020-06-19 DIAGNOSIS — E46 Unspecified protein-calorie malnutrition: Secondary | ICD-10-CM | POA: Diagnosis not present

## 2020-06-19 DIAGNOSIS — R7309 Other abnormal glucose: Secondary | ICD-10-CM | POA: Diagnosis not present

## 2020-06-19 LAB — GLUCOSE, CAPILLARY
Glucose-Capillary: 134 mg/dL — ABNORMAL HIGH (ref 70–99)
Glucose-Capillary: 120 mg/dL — ABNORMAL HIGH (ref 70–99)
Glucose-Capillary: 127 mg/dL — ABNORMAL HIGH (ref 70–99)

## 2020-06-19 MED ORDER — CLOPIDOGREL BISULFATE 75 MG PO TABS: 75.0000 mg | ORAL_TABLET | Freq: Every day | ORAL | 0 refills | Status: DC

## 2020-06-19 MED ORDER — ACETAMINOPHEN 325 MG PO TABS: 650.0000 mg | ORAL_TABLET | Freq: Four times a day (QID) | ORAL | Status: DC | PRN

## 2020-06-19 MED ORDER — ASPIRIN 81 MG PO TBEC
81.0000 mg | DELAYED_RELEASE_TABLET | Freq: Every day | ORAL | 11 refills | Status: DC
Start: 2020-06-20 — End: 2020-08-09

## 2020-06-19 MED ORDER — METFORMIN HCL 500 MG PO TABS
1000.0000 mg | ORAL_TABLET | Freq: Every day | ORAL | Status: DC
  Administered 2020-06-20 – 2020-07-11 (×22): 1000 mg via ORAL
  Filled 2020-06-19 (×22): qty 2

## 2020-06-19 MED ORDER — ENOXAPARIN SODIUM 40 MG/0.4ML ~~LOC~~ SOLN: 40.0000 mg | SUBCUTANEOUS | Status: DC

## 2020-06-19 MED ORDER — ATORVASTATIN CALCIUM 80 MG PO TABS
80.0000 mg | ORAL_TABLET | Freq: Every day | ORAL | 0 refills | Status: DC
Start: 2020-06-20 — End: 2020-07-10

## 2020-06-19 MED ORDER — ENOXAPARIN SODIUM 40 MG/0.4ML ~~LOC~~ SOLN
40.0000 mg | SUBCUTANEOUS | Status: DC
  Administered 2020-06-20 – 2020-06-24 (×5): 40 mg via SUBCUTANEOUS
  Filled 2020-06-19 (×5): qty 0.4

## 2020-06-19 MED ORDER — LOSARTAN POTASSIUM 25 MG PO TABS
25.0000 mg | ORAL_TABLET | Freq: Every day | ORAL | Status: DC
  Administered 2020-06-20 – 2020-06-24 (×5): 25 mg via ORAL
  Filled 2020-06-19 (×5): qty 1

## 2020-06-19 MED ORDER — TICAGRELOR 90 MG PO TABS
90.0000 mg | ORAL_TABLET | Freq: Two times a day (BID) | ORAL | Status: DC
  Administered 2020-06-19 – 2020-07-11 (×44): 90 mg via ORAL
  Filled 2020-06-19 (×44): qty 1

## 2020-06-19 MED ORDER — NICOTINE 21 MG/24HR TD PT24
21.0000 mg | MEDICATED_PATCH | Freq: Every day | TRANSDERMAL | Status: DC
  Filled 2020-06-19: qty 1

## 2020-06-19 MED ORDER — ATORVASTATIN CALCIUM 80 MG PO TABS
80.0000 mg | ORAL_TABLET | Freq: Every day | ORAL | Status: DC
  Administered 2020-06-20 – 2020-07-11 (×22): 80 mg via ORAL
  Filled 2020-06-19 (×22): qty 1

## 2020-06-19 MED ORDER — ASPIRIN EC 81 MG PO TBEC
81.0000 mg | DELAYED_RELEASE_TABLET | Freq: Every day | ORAL | Status: DC
  Administered 2020-06-20 – 2020-07-11 (×22): 81 mg via ORAL
  Filled 2020-06-19 (×21): qty 1

## 2020-06-19 MED ORDER — METFORMIN HCL 500 MG PO TABS
1500.0000 mg | ORAL_TABLET | Freq: Every day | ORAL | Status: DC
  Administered 2020-06-19 – 2020-07-10 (×22): 1500 mg via ORAL
  Filled 2020-06-19 (×22): qty 3

## 2020-06-19 MED ORDER — SORBITOL 70 % SOLN
30.0000 mL | Freq: Every day | Status: DC | PRN
  Administered 2020-06-26: 30 mL via ORAL
  Filled 2020-06-19: qty 30

## 2020-06-19 MED ORDER — TICAGRELOR 90 MG PO TABS: 90.0000 mg | ORAL_TABLET | Freq: Two times a day (BID) | ORAL | 0 refills | Status: DC

## 2020-06-19 MED ORDER — ACETAMINOPHEN 650 MG RE SUPP: 650.0000 mg | Freq: Four times a day (QID) | RECTAL | Status: DC | PRN

## 2020-06-19 NOTE — Progress Notes (Signed)
PT Cancellation Note  Patient Details Name: Jacob Short MRN: [id number] DOB: 01/23/1946   Cancelled Treatment:    Reason Eval/Treat Not Completed: Fatigue/lethargy limiting ability to participate. Pt declined stating he just finished up with OT. Noted CIR to admit today. Will try back as time allows.    Thelma Comp 06/19/2020, 10:59 AM   Rolinda Roan, PT, DPT Acute Rehabilitation Services Pager: [phone] Office: [phone]

## 2020-06-19 NOTE — IPOC Note (Signed)
Individualized overall Plan of Care [facility]) Patient Details Name: Jacob Short MRN: [id number] DOB: 28-Sep-1945  Admitting Diagnosis: Small vessel disease, cerebrovascular  Hospital Problems: Principal Problem:   Small vessel disease, cerebrovascular Active Problems:   Leukocytosis   Hypoalbuminemia due to protein-calorie malnutrition ([place])   Hyponatremia   Essential hypertension   Diabetic peripheral neuropathy ([place])     Functional Problem List: Nursing Bladder, Endurance, Edema, Safety, Medication Management  PT Balance, Endurance, Motor, Perception, Safety  OT Balance, Perception, Safety, Cognition, Endurance, Vision, Motor  SLP    TR         Basic ADL's: OT Grooming, Bathing, Dressing, Toileting     Advanced  ADL's: OT Simple Meal Preparation     Transfers: PT Bed Mobility, Bed to Chair, Teacher, early years/pre, Tub/Shower     Locomotion: PT Stairs, Ambulation     Additional Impairments: OT Fuctional Use of Upper Extremity  SLP        TR      Anticipated Outcomes Item Anticipated Outcome  Self Feeding setup  Swallowing      Basic self-care  supervision  Toileting  supervision   Bathroom Transfers supervision  Bowel/Bladder  continent of B/B normal bowel pattern  Transfers  Supervision with LRAD  Locomotion  Supervision with LRAD  Communication     Cognition     Pain  pain <2/10  Safety/Judgment  pt will be safe while in CIR without injury   Therapy Plan: PT Intensity: Minimum of 1-2 x/day ,45 to 90 minutes PT Frequency: 5 out of 7 days PT Duration Estimated Length of Stay: 3weeks OT Intensity: Minimum of 1-2 x/day, 45 to 90 minutes OT Frequency: 5 out of 7 days OT Duration/Estimated Length of Stay: 3 weeks      Team Interventions: Nursing Interventions Patient/Family Education, Bladder Management, Bowel Management, Medication Management, Pain Management, Disease Management/Prevention, Discharge Planning, Psychosocial Support  PT  interventions Ambulation/gait training, Discharge planning, Balance/vestibular training, Disease management/prevention, Neuromuscular re-education, Skin care/wound management, Therapeutic Exercise, Visual/perceptual remediation/compensation, Therapeutic Activities, Psychosocial support, Functional mobility training, UE/LE Strength taining/ROM, Splinting/orthotics, Pain management, DME/adaptive equipment instruction, Cognitive remediation/compensation, Community reintegration, Functional electrical stimulation, Patient/family education, IT trainer, UE/LE Coordination activities, Wheelchair propulsion/positioning  OT Interventions Training and development officer, Discharge planning, Self Care/advanced ADL retraining, Therapeutic Activities, UE/LE Coordination activities, Cognitive remediation/compensation, Disease mangement/prevention, Functional mobility training, Patient/family education, Therapeutic Exercise, Visual/perceptual remediation/compensation, DME/adaptive equipment instruction, Neuromuscular re-education, Psychosocial support, UE/LE Strength taining/ROM, Wheelchair propulsion/positioning  SLP Interventions    TR Interventions    SW/CM Interventions Discharge Planning, Psychosocial Support, Patient/Family Education   Barriers to Discharge MD  Medical stability  Nursing      PT Inaccessible home environment, Decreased caregiver support, Home environment Child psychotherapist, Insurance for SNF coverage, Lack of/limited family support 4 STE, questionable 24/7 availability  OT      SLP      SW Other (comments) Needs PCP set up proir to DC home   Team Discharge Planning: Destination: PT-Home ,OT- Home , SLP-  Projected Follow-up: PT-Outpatient PT, 24 hour supervision/assistance, OT-  Other (comment) (home health versus outpatient), SLP-  Projected Equipment Needs: PT-To be determined, OT-  , SLP-  Equipment Details: PT-Pt has no DME, OT-  Patient/family involved in discharge planning: PT-  Patient,  OT-Patient, SLP-   MD ELOS: 12-15 days. Medical Rehab Prognosis:  Good Assessment: 74 year old right-handed male with history of CAD and angioplasty maintained on Plavix, diabetes mellitus, tobacco abuse, AAA without rupture and hyperlipidemia. Presented 06/15/2020 with right side  weakness.  Cranial CT scan negative for acute changes.  Chronic lacunar infarct of the right corona radiata.  Patient did not receive TPA.  CT angiogram of head and neck with no emergent findings.  MRI showed acute lacunar infarct of the left corona radiata.  Echocardiogram with ejection fraction of 50 to 10% grade 1 diastolic dysfunction.  Admission chemistries unremarkable aside glucose 157, urine drug screen negative, urinalysis negative nitrite.  Follow neurology services noted given failure of aspirin Plavix recommendations are for aspirin 81 mg daily and Brilinta 90 mg twice daily x30 days and transition back to aspirin and Plavix.  Tolerating regular diet.  Patient with resulting functional deficits with mobility, transfers, self-care.  Will set goals for Supervision with PT/OT.    Due to the current state of emergency, patients may not be receiving their 3-hours of Medicare-mandated therapy.  See Team Conference Notes for weekly updates to the plan of care

## 2020-06-19 NOTE — H&P (Signed)
Physical Medicine and Rehabilitation Admission H&P    CC: Cerebrovascular small vessel disease  HPI: Jacob Short is a 74 year old right-handed male with history of CAD and angioplasty maintained on Plavix, diabetes mellitus, tobacco abuse, AAA without rupture and hyperlipidemia.  Per chart review patient lives with spouse.  Independent prior to admission.  1 level home 3 steps to entry.  Presented 06/15/2020 with right side weakness.  Cranial CT scan negative for acute changes.  Chronic lacunar infarct of the right corona radiata.  Patient did not receive TPA.  CT angiogram of head and neck with no emergent findings.  MRI showed acute lacunar infarct of the left corona radiata.  Echocardiogram with ejection fraction of 50 to 21% grade 1 diastolic dysfunction.  Admission chemistries unremarkable aside glucose 157, urine drug screen negative, urinalysis negative nitrite.  Follow neurology services noted given failure of aspirin Plavix recommendations are for aspirin 81 mg daily and Brilinta 90 mg twice daily x30 days and transition back to aspirin and Plavix.  Tolerating regular diet.  Therapy evaluations completed and patient was admitted for a comprehensive rehab program.  Review of Systems  Constitutional: Negative for chills and fever.  HENT: Negative for hearing loss.   Eyes: Negative for blurred vision, double vision and photophobia.  Respiratory: Negative for shortness of breath.   Cardiovascular: Negative for chest pain, palpitations and leg swelling.  Gastrointestinal: Positive for constipation. Negative for heartburn, nausea and vomiting.  Genitourinary: Negative for dysuria, flank pain and hematuria.  Musculoskeletal: Positive for myalgias.  Skin: Negative for rash.  Neurological: Positive for weakness.  All other systems reviewed and are negative.  Past Medical History:  Diagnosis Date  . Coronary artery disease   . Diabetes mellitus without complication ([place])   .  Peripheral vascular disease [facility])    Past Surgical History:  Procedure Laterality Date  . CORONARY ANGIOPLASTY     No family history on file. Social History:  reports that he quit smoking 4 days ago. He uses smokeless tobacco. He reports previous alcohol use. He reports previous drug use. Allergies: No Known Allergies Medications Prior to Admission  Medication Sig Dispense Refill  . [START ON 06/20/2020] aspirin EC 81 MG EC tablet Take 1 tablet (81 mg total) by mouth daily. Swallow whole. 30 tablet 11  . [START ON 06/20/2020] atorvastatin (LIPITOR) 80 MG tablet Take 1 tablet (80 mg total) by mouth daily. 30 tablet 0  . [START ON 07/19/2020] clopidogrel (PLAVIX) 75 MG tablet Take 1 tablet (75 mg total) by mouth daily. 30 tablet 0  . losartan (COZAAR) 25 MG tablet Take 25 mg by mouth daily.    . metFORMIN (GLUCOPHAGE) 500 MG tablet Take 1,000-1,500 mg by mouth See admin instructions. Take 1000mg  in the morning and 1500mg  in the evening.    . ticagrelor (BRILINTA) 90 MG TABS tablet Take 1 tablet (90 mg total) by mouth 2 (two) times daily for 26 days. 52 tablet 0    Drug Regimen Review Drug regimen was reviewed and remains appropriate with no significant issues identified  Home: Home Living Family/patient expects to be discharged to:: Private residence Living Arrangements: Spouse/significant other Available Help at Discharge: Family, Available 24 hours/day Type of Home: House Home Access: Stairs to enter [employer] of Steps: 3 Entrance Stairs-Rails: Right, Left Home Layout: One level Bathroom Shower/Tub: Gaffer, Charity fundraiser: Standard Home Equipment: Industrial/product designer History: Prior Function Level of Independence: Independent Comments: Pt fully independent PTA; pt and  wife enjoy going on beach vacations, going to the pool  Functional Status:  Mobility: Bed Mobility Overal bed mobility: Needs Assistance Bed Mobility: Supine to Sit Supine to  sit: Min assist Sit to supine: Min assist General bed mobility comments: min A to progress LEs and elevate trunk. Once up unable to scoot forward. Mod A to progress hips to EOB Transfers Overall transfer level: Needs assistance Equipment used: 1 person hand held assist Transfers: Sit to/from Stand, Stand Pivot Transfers Sit to Stand: Min assist Stand pivot transfers: Mod assist General transfer comment: Pt able to stand from with min A. SPT to recliner via face to face required additional assist as R LE tends to buckle. Pt prematurally sitting into recliner chair.  Ambulation/Gait Ambulation/Gait assistance: +2 safety/equipment, Mod assist Gait Distance (Feet): 15 Feet (x3) Assistive device: 1 person hand held assist (+ LUE using chair rail in hallway as external support) Gait Pattern/deviations: Step-to pattern, Step-through pattern, Decreased stride length, Trunk flexed, Narrow base of support, Decreased stance time - right General Gait Details: Light mod A to ambulate in hallway. Pt requiring blocking at R knee during stance phase and assist for balance. VC to widen BOS. Gait velocity: decr  ADL: ADL Overall ADL's : Needs assistance/impaired Eating/Feeding: Minimal assistance, Sitting, Cueing for compensatory techinques Eating/Feeding Details (indicate cue type and reason): must use LUE to feed self and is R dominant. Grooming: Wash/dry face, Wash/dry hands, Oral care, Minimal assistance, Sitting, Cueing for compensatory techniques Grooming Details (indicate cue type and reason): pt does well but needs cues to use LUE for now. Upper Body Bathing: Sitting, Moderate assistance, Cueing for compensatory techniques Upper Body Bathing Details (indicate cue type and reason): min assist to get to all areas with  no functional use of RUE and min guard for balance. Lower Body Bathing: Sit to/from stand, Moderate assistance, Cueing for compensatory techniques Lower Body Bathing Details (indicate  cue type and reason): sit to stand with min assist but mod assist needed to maintain balance in standing. Upper Body Dressing : Moderate assistance, Sitting, Cueing for compensatory techniques Lower Body Dressing: Maximal assistance, Sit to/from stand, Cueing for compensatory techniques Lower Body Dressing Details (indicate cue type and reason): balance in sitting and standing limited when challengd. Toilet Transfer: Moderate assistance, Stand-pivot, BSC, RW Toilet Transfer Details (indicate cue type and reason): used walker with therapist assisting with [place] and Hygiene: Moderate assistance, Sit to/from stand, Cueing for compensatory techniques Functional mobility during ADLs: Moderate assistance, Rolling walker General ADL Comments: Pt very limited due to no functional use of RUE and LLE is weaker than LLE affecting balance and safety.  Cognition: Cognition Overall Cognitive Status: Within Functional Limits for tasks assessed Orientation Level: Oriented X4 Cognition Arousal/Alertness: Awake/alert Behavior During Therapy: WFL for tasks assessed/performed Overall Cognitive Status: Within Functional Limits for tasks assessed General Comments: Pt very motivated and engaged, dry sense of humor and quick-witted.  Physical Exam: Blood pressure 127/89, pulse 76, temperature 98.8 F (37.1 C), temperature source Oral, resp. rate 20, height 6\' 4"  (1.93 m), weight 85.9 kg, SpO2 99 %. General: Alert and oriented x 3, No apparent distress HEENT: Head is normocephalic, atraumatic, PERRLA, EOMI, sclera anicteric, oral mucosa pink and moist, dentition intact, ext ear canals clear,  Neck: Supple without JVD or lymphadenopathy Heart: Reg rate and rhythm. No murmurs rubs or gallops Chest: CTA bilaterally without wheezes, rales, or rhonchi; no distress Abdomen: Soft, non-tender, non-distended, bowel sounds positive. Extremities: No clubbing, cyanosis, or  edema. Pulses are  2+ Skin: Clean and intact without signs of breakdown Neuro: Patient is alert in no acute distress.  Mild dysarthria but fully intelligible.  Oriented x3.  Fair awareness of deficits. R facial droop. RUE w/ 1/5 SA, EF, 2/5 EE, 0/5 WE and hand grip. RLE 4+/5 throughout, L sided strength intact Musculoskeletal: Full ROM, No pain with AROM or PROM in the neck, trunk, or extremities. Posture appropriate Psych: Pt's affect is appropriate. Pt is cooperative   Results for orders placed or performed during the hospital encounter of 06/19/20 (from the past 48 hour(s))  Glucose, capillary     Status: Abnormal   Collection Time: 06/19/20  5:38 PM  Result Value Ref Range   Glucose-Capillary 120 (H) 70 - 99 mg/dL    Comment: Glucose reference range applies only to samples taken after fasting for at least 8 hours.   No results found.     Medical Problem List and Plan: 1.  Left side weakness secondary to left corona radiata infarct secondary small vessel disease  -patient may shower  -ELOS/Goals: 14-16 days 2.  Antithrombotics: -DVT/anticoagulation: Lovenox.  -antiplatelet therapy: Current plan is for aspirin 81 mg daily and Brilinta 90 mg twice daily x30 days then transition back to aspirin and Plavix 3. Pain Management: Denies pain.  4. Mood: Provide emotional support  -antipsychotic agents: N/A 5. Neuropsych: This patient is capable of making decisions on his own behalf. 6. Skin/Wound Care: Routine skin checks 7. Fluids/Electrolytes/Nutrition: Routine in and outs with follow-up chemistries 8.  CAD with angioplasty.  Continue aspirin. 9.  Diabetes mellitus with peripheral neuropathy.  Hemoglobin A1c 6.9.  Glucophage 1000 mg at breakfast 1500 mg with supper.    9/27: Slightly elevated. Advised to minimize added sugar in diet.  10.  Hyperlipidemia.  Lipitor 11.  Tobacco abuse.  No longer using the NicoDerm patch! Wife does smoke but she she states will stop when he returns home. Provide  counseling 12.  Hypertension.  Cozaar 25 mg daily.  Monitor with increased mobility 13.  History of AAA without rupture.  Follow-up outpatient monitoring.  Lavon Paganini Angiulli, PA-C 06/19/2020   I have personally performed a face to face diagnostic evaluation, including, but not limited to relevant history and physical exam findings, of this patient and developed relevant assessment and plan.  Additionally, I have reviewed and concur with the physician assistant's documentation above.  The patient's status has not changed. The original post admission physician evaluation remains appropriate, and any changes from the pre-admission screening or documentation from the acute chart are noted above.   Leeroy Cha, MD

## 2020-06-19 NOTE — Progress Notes (Addendum)
HD#4 Subjective:  Overnight Events: None  Patient evaluated at bedside this morning. He is feeling well has more movement in the right arm today. He is eager to [facility] to [facility].  Objective:  Vital signs in last 24 hours: Vitals:   06/18/20 2032 06/18/20 2303 06/19/20 0411 06/19/20 0425  BP: (!) 158/76 (!) 149/83  135/85  Pulse: 79 85  78  Resp: 20 18  18   Temp: 98.3 F (36.8 C) 98.3 F (36.8 C)  98.2 F (36.8 C)  TempSrc: Oral Oral  Oral  SpO2: 97% 97%  97%  Weight:   84.5 kg   Height:       Supplemental O2: Room Air SpO2: 97 %   Physical Exam:  Physical Exam Constitutional:      Appearance: Normal appearance.  Abdominal:     General: Abdomen is flat.     Palpations: Abdomen is soft.  Neurological:     Mental Status: He is alert and oriented to person, place, and time.     Comments: 2/5 strength in right upper extremity, 1/5 grip strength of right hand 5/5 on the left     Filed Weights   06/17/20 0500 06/18/20 0440 06/19/20 0411  Weight: 84 kg 85.6 kg 84.5 kg     Intake/Output Summary (Last 24 hours) at 06/19/2020 0624 Last data filed at 06/19/2020 0500 Gross per 24 hour  Intake 480 ml  Output 1100 ml  Net -620 ml   Net IO Since Admission: -1,935 mL [06/19/20 0624]  Pertinent Labs: CBC Latest Ref Rng & Units 06/18/2020 06/17/2020 06/16/2020  WBC 4.0 - 10.5 K/uL 9.2 10.1 10.9(H)  Hemoglobin 13.0 - 17.0 g/dL 14.7 15.5 15.0  Hematocrit 39 - 52 % 43.7 46.8 45.4  Platelets 150 - 400 K/uL 228 228 237    CMP Latest Ref Rng & Units 06/18/2020 06/17/2020 06/16/2020  Glucose 70 - 99 mg/dL 179(H) 174(H) 128(H)  BUN 8 - 23 mg/dL 9 8 9   Creatinine 0.61 - 1.24 mg/dL 0.70 0.64 0.57(L)  Sodium 135 - 145 mmol/L 136 138 140  Potassium 3.5 - 5.1 mmol/L 3.8 3.5 3.7  Chloride 98 - 111 mmol/L 103 106 103  CO2 22 - 32 mmol/L 24 23 24   Calcium 8.9 - 10.3 mg/dL 8.7(L) 8.9 9.0  Total Protein 6.5 - 8.1 g/dL - - -  Total Bilirubin 0.3 - 1.2 mg/dL - - -  Alkaline  Phos 38 - 126 U/L - - -  AST 15 - 41 U/L - - -  ALT 0 - 44 U/L - - -    Imaging: No results found.  Assessment/Plan:   Principal Problem:   Cerebral infarction [facility]) Active Problems:   Tobacco use disorder   Peripheral artery disease ([facility])   Diabetes ([place])   AAA (abdominal aortic aneurysm) without rupture [facility])   Patient Summary: This is a 74 year old male with a history of AAA status post repair in 2018, diabetes mellitus type 2, hypertension, CAD, iliac arterial aneurysm, carotid artery stenosis, PAD, and tobacco use who presented with right facial droop and right arm weakness admitted for acute lacunar infart of the left corona radiata.   Acute Cerebral Infarction of the Left Corona Radiata: Patient continues to have RUE weakness.  Etiology likely due to small vessel disease. He has a bed at [facility] will discharge him to [facility]. - Continue ASA 81 mg daily and Brilinta 90 mg BID for 30 days before transitioning back to Asprin  and plavix - Continue atorvastatin 80 mg daily - PM&R: evaluated and patient appropriate for [facility], pending insurance authorization   CAD: PAD, PVD: AAA s/p repair: Seen by vascular surgery 9/9, noted increasing difficulties with instability and falling, walking daily, no other complaints.   -Continue home ASA and brilinta, transition back to ASA and clopidogrel after 30 days of brilinta    Hypertension: BP 150/80 today. Restarted on home losartan 25 mg with some improvement in BP but continues to fluctuate will need continued evaluation to adjust medication to improve BP until normotensive.   Diabetes mellitus: On metformin 1000 mg in AM and 1500 mg in PM at home. A1c 6.9.  Continued on home medication.    Diet: Heart Healthy IVF: None,None VTE: Enoxaparin Code: Full PT/OT recs: [facility]: insurance authorized bed available      Dispo: Anticipated discharge to Rehab today.  Iona Beard, MD 06/19/2020, 6:24 AM Pager: [phone]  Please contact  the on call pager after 5 pm and on weekends at [phone].

## 2020-06-19 NOTE — Discharge Summary (Signed)
Name: Jacob Short MRN: [id number] DOB: 10/20/1945 74 y.o. PCP: Patient, No Pcp Per  Date of Admission: 06/15/2020  7:51 AM Date of Discharge:  06/19/20 Attending Physician: Axel Filler, *  Discharge Diagnosis: 1. Cerebral infarction 2. Diabetes Mellitus 3. Tobacco use disorder 4. Peripheral artery disease  Discharge Medications: Allergies as of 06/19/2020   No Known Allergies     Medication List    STOP taking these medications   aspirin 325 MG tablet Replaced by: aspirin 81 MG EC tablet   Ibuprofen-diphenhydrAMINE HCl 200-25 MG Caps   naproxen sodium 220 MG tablet Commonly known as: ALEVE   rosuvastatin 10 MG tablet Commonly known as: CRESTOR     TAKE these medications   aspirin 81 MG EC tablet Take 1 tablet (81 mg total) by mouth daily. Swallow whole. Start taking on: June 20, 2020 Replaces: aspirin 325 MG tablet   atorvastatin 80 MG tablet Commonly known as: LIPITOR Take 1 tablet (80 mg total) by mouth daily. Start taking on: June 20, 2020   clopidogrel 75 MG tablet Commonly known as: PLAVIX Take 1 tablet (75 mg total) by mouth daily. Start taking on: July 19, 2020 What changed: These instructions start on July 19, 2020. If you are unsure what to do until then, ask your doctor or other care provider.   losartan 25 MG tablet Commonly known as: COZAAR Take 25 mg by mouth daily.   metFORMIN 500 MG tablet Commonly known as: GLUCOPHAGE Take 1,000-1,500 mg by mouth See admin instructions. Take 1000mg  in the morning and 1500mg  in the evening.   ticagrelor 90 MG Tabs tablet Commonly known as: BRILINTA Take 1 tablet (90 mg total) by mouth 2 (two) times daily for 26 days.       Disposition and follow-up:   Jacob Short was discharged from [facility] in Stable condition.  At the hospital follow up visit please address:  1.  Stroke: Please evaluate that patient is tolerating dual antiplatelet therapy  without signs of bleeding.  HTN: Please evaluate for continued hypertension and adjust mediation until normotensive HLD: Please evaluate that patient is tolerating high intensity statin and evaluate for need for dose adjustment as the lipid panel was normal during admission  DM: Evaluate for diabetes control. May benefit from staring a SGLT-2 given recent stroke.  2.  Labs / imaging needed at time of follow-up: CBG, bmp, CBC  3.  Pending labs/ test needing follow-up: None  Follow-up Appointments:  Follow-up Information    [facility]. Schedule an appointment as soon as possible for a visit in 4 week(s).   Specialty: Neurology Contact information: [id number] [place] [facility] Course by problem list: This is a 74 year old male with a history ofAAA status post repair in 2018, diabetes mellitus type 2, hypertension,CAD,iliac arterial aneurysm, carotid artery stenosis, PAD, and tobacco use who presented with right facial droop and right arm weakness admitted for acute lacunar infart of the left corona radiata.  Acute CVA: Patient has had a significant cardiovascular history including CAD, PAD, PVD, AAA status post repair, who is currently on aspirin and Plavix daily. Noted to have a 1 day history of right facial droop and right arm weakness. Neurologic exam showed 3/5 weakness in right upper extremity and mild right facial droop on admission.  CT head showed no acute findings, noted chronic lacunar infarct  at the right corona radiata.  CTA with perfusion showed no detected infarct, 7 cc area highlighted in the inferior temporal lobe. MRI showed acute lacunar infarct at the left corona radiata. Physical exam with worsening strength of right upper extremity with inability to move the right arm or grip on hospital day 2. Limited MRI with DWI which was without significant change in the left corona radiata. TTE  with EF of 55-60% with no valvular abnormalities or intracardiac source of embolism. Infarct likely due to small vessel disease. Switched to aspirin 81 mg and Brilinta 90 BID for 30 days with plan to transition back ASA and Plavix after. Lipid panel normal, statin increased to atorvatatin 80mg  daily.  Accepted for inpatient rehabilitation and plan for neurology follow up with [facility] in 4 weeks.   Hypertension: Initially hypertensive to 220s on admission. Allowed for permissive hypertensive for 48 hours then restarted on home losartan 25 mg. Some improvement in BP but continues to be periodically hypertensive to 150s /90s. Will need evaluation for medication adjustment for goal  BP <130/90.  Diabetes mellitus: On metformin 1000 mg in AM and 1500 mg in PM.  A1c 6.9  Resumed on home medications. Glucose 134 on discharge. May benefit from addition to SGLT-2 inhibitor given recent stroke.  HLD: On rosuvastatin 10 mg daily prior to admission. Increased to atorvastatin 80 mg daily. Lipid profile unremarkable.   CAD: PAD, PVD: AAA s/p repair: Seen by vascular surgery 9/9, noted increasing difficulties with instability and falling, walking daily, no other complaints. At that time noted he could stop his Plavix and just continue the aspirin daily. Patient reported that he was still taking plavix daily. Started on ASA and Brilinta for now in setting of acute CVA however does not need to be on plavix for his endograft or carotid stenosis.   Tobacco use: Smokes 1 ppd, trying to quit. Discussed smoking cessation.  -Encourage smoking cessation -Nicotine patch while here  Discharge Vitals:   BP (!) 150/80 (BP Location: Right Arm)   Pulse 79   Temp 98.7 F (37.1 C) (Oral)   Resp 18   Ht 6\' 4"  (1.93 m)   Wt 84.5 kg   SpO2 99%   BMI 22.68 kg/m   Pertinent Labs, Studies, and Procedures:  CT Code Stroke CTA Head W/WO contrast  Result Date: 06/15/2020 CLINICAL DATA:  Right-sided weakness. EXAM: CT  ANGIOGRAPHY HEAD AND NECK CT PERFUSION BRAIN TECHNIQUE: Multidetector CT imaging of the head and neck was performed using the standard protocol during bolus administration of intravenous contrast. Multiplanar CT image reconstructions and MIPs were obtained to evaluate the vascular anatomy. Carotid stenosis measurements (when applicable) are obtained utilizing NASCET criteria, using the distal internal carotid diameter as the denominator. Multiphase CT imaging of the brain was performed following IV bolus contrast injection. Subsequent parametric perfusion maps were calculated using RAPID software. CONTRAST:  Dose is currently unknown. COMPARISON:  Head CT from earlier the same day FINDINGS: CTA NECK FINDINGS Aortic arch: Atheromatous plaque.  Three vessel branching. Right carotid system: Mixed density mild for age plaque at the bifurcation without stenosis or ulceration. No ICA beading. Left carotid system: ICA tortuosity with loop. Mild plaque at the common carotid and bifurcation. No stenosis or ulceration. Vertebral arteries: No proximal subclavian stenosis. There is bilateral subclavian atherosclerosis. Codominant vertebral arteries that are widely patent and smoothly contoured Skeleton: Facet osteoarthritis which is generalized with particularly bulky left-sided spurring at C4-5. Other neck: No evidence of mass or inflammation. Upper  chest: Mild scar-like appearance in the upper lobes. Review of the MIP images confirms the above findings CTA HEAD FINDINGS Anterior circulation: Atheromatous plaque along the carotid siphons. No branch occlusion, beading, or aneurysm. No proximal flow limiting stenosis Posterior circulation: Vertebral and basilar arteries are smooth and widely patent. Elongation of the basilar which contacts the ventral brain. No branch occlusion, beading, or aneurysm. No proximal significant stenosis. Venous sinuses: Patent Anatomic variants: None significant Review of the MIP images confirms the  above findings Prelim results were called by telephone at the time of interpretation on 06/15/2020 at 8:13 am to provider [place] . CT Brain Perfusion Findings: ASPECTS: 10 CBF (<30%) Volume: 74mL Perfusion (Tmax>6.0s) volume: 32mL. This area is 1 slice above the left petrous temporal bone and could be artifactual given history of pure motor weakness. As well, the area appears to extend to the medial temporal lobe, extending towards PCA distribution IMPRESSION: CTA: 1. No emergent finding. Negative for large vessel occlusion or flow reducing stenosis. 2. Moderate atherosclerosis in the head and neck. CT perfusion: No detected infarct. There is a 7 cc area highlighted in the inferior left temporal lobe, possibly artifactual given the provided history and location along the skull base. Electronically Signed   By: Monte Fantasia M.D.   On: 06/15/2020 08:22   CT Code Stroke CTA Neck W/WO contrast  Result Date: 06/15/2020 CLINICAL DATA:  Right-sided weakness. EXAM: CT ANGIOGRAPHY HEAD AND NECK CT PERFUSION BRAIN TECHNIQUE: Multidetector CT imaging of the head and neck was performed using the standard protocol during bolus administration of intravenous contrast. Multiplanar CT image reconstructions and MIPs were obtained to evaluate the vascular anatomy. Carotid stenosis measurements (when applicable) are obtained utilizing NASCET criteria, using the distal internal carotid diameter as the denominator. Multiphase CT imaging of the brain was performed following IV bolus contrast injection. Subsequent parametric perfusion maps were calculated using RAPID software. CONTRAST:  Dose is currently unknown. COMPARISON:  Head CT from earlier the same day FINDINGS: CTA NECK FINDINGS Aortic arch: Atheromatous plaque.  Three vessel branching. Right carotid system: Mixed density mild for age plaque at the bifurcation without stenosis or ulceration. No ICA beading. Left carotid system: ICA tortuosity with loop. Mild plaque at  the common carotid and bifurcation. No stenosis or ulceration. Vertebral arteries: No proximal subclavian stenosis. There is bilateral subclavian atherosclerosis. Codominant vertebral arteries that are widely patent and smoothly contoured Skeleton: Facet osteoarthritis which is generalized with particularly bulky left-sided spurring at C4-5. Other neck: No evidence of mass or inflammation. Upper chest: Mild scar-like appearance in the upper lobes. Review of the MIP images confirms the above findings CTA HEAD FINDINGS Anterior circulation: Atheromatous plaque along the carotid siphons. No branch occlusion, beading, or aneurysm. No proximal flow limiting stenosis Posterior circulation: Vertebral and basilar arteries are smooth and widely patent. Elongation of the basilar which contacts the ventral brain. No branch occlusion, beading, or aneurysm. No proximal significant stenosis. Venous sinuses: Patent Anatomic variants: None significant Review of the MIP images confirms the above findings Prelim results were called by telephone at the time of interpretation on 06/15/2020 at 8:13 am to provider [place] . CT Brain Perfusion Findings: ASPECTS: 10 CBF (<30%) Volume: 58mL Perfusion (Tmax>6.0s) volume: 86mL. This area is 1 slice above the left petrous temporal bone and could be artifactual given history of pure motor weakness. As well, the area appears to extend to the medial temporal lobe, extending towards PCA distribution IMPRESSION: CTA: 1. No emergent finding. Negative  for large vessel occlusion or flow reducing stenosis. 2. Moderate atherosclerosis in the head and neck. CT perfusion: No detected infarct. There is a 7 cc area highlighted in the inferior left temporal lobe, possibly artifactual given the provided history and location along the skull base. Electronically Signed   By: Monte Fantasia M.D.   On: 06/15/2020 08:22   MR BRAIN WO CONTRAST  Result Date: 06/16/2020 CLINICAL DATA:  Right-sided weakness.  EXAM: MRI HEAD WITHOUT CONTRAST TECHNIQUE: Multiplanar, multiecho pulse sequences of the brain and surrounding structures were obtained without intravenous contrast. COMPARISON:  MRI of the brain June 15, 2020 FINDINGS: Brain: No significant change of the focus of acute/subacute infarct in the left corona radiata. No new focus of restricted diffusion seen. Again noted susceptibility artifact in the left parasagittal posterior frontal lobe, suggesting hemosiderin deposit. Vascular: Normal flow voids. Skull and upper cervical spine: Negative Sinuses/Orbits: Negative Other: None. IMPRESSION: No significant change of the focus of acute/subacute infarct in the left corona radiata. No new focus of restricted diffusion identified. Electronically Signed   By: Pedro Earls M.D.   On: 06/16/2020 12:55   MR BRAIN WO CONTRAST  Result Date: 06/15/2020 CLINICAL DATA:  Neuro deficit, acute, with stroke suspected. Right-sided weakness EXAM: MRI HEAD WITHOUT CONTRAST TECHNIQUE: Multiplanar, multiecho pulse sequences of the brain and surrounding structures were obtained without intravenous contrast. COMPARISON:  Head CT and CTA from earlier today FINDINGS: Brain: 10 mm acute infarct in the left corona radiata, adjacent to the lateral ventricle. Remote lacunar infarct at the right corona radiata. Ischemic gliosis is minor. Small focus of remote hemorrhage along the parasagittal left frontal lobe.Generalized cortical volume loss. No hemorrhage, hydrocephalus, or masslike finding. Vascular: Normal flow voids. Skull and upper cervical spine: Normal marrow signal Sinuses/Orbits: Retention cysts along the floor of the right maxillary sinus. IMPRESSION: Acute lacunar infarct at the left corona radiata. Electronically Signed   By: Monte Fantasia M.D.   On: 06/15/2020 10:51   CT Code Stroke Cerebral Perfusion with contrast  Result Date: 06/15/2020 CLINICAL DATA:  Right-sided weakness. EXAM: CT ANGIOGRAPHY HEAD  AND NECK CT PERFUSION BRAIN TECHNIQUE: Multidetector CT imaging of the head and neck was performed using the standard protocol during bolus administration of intravenous contrast. Multiplanar CT image reconstructions and MIPs were obtained to evaluate the vascular anatomy. Carotid stenosis measurements (when applicable) are obtained utilizing NASCET criteria, using the distal internal carotid diameter as the denominator. Multiphase CT imaging of the brain was performed following IV bolus contrast injection. Subsequent parametric perfusion maps were calculated using RAPID software. CONTRAST:  Dose is currently unknown. COMPARISON:  Head CT from earlier the same day FINDINGS: CTA NECK FINDINGS Aortic arch: Atheromatous plaque.  Three vessel branching. Right carotid system: Mixed density mild for age plaque at the bifurcation without stenosis or ulceration. No ICA beading. Left carotid system: ICA tortuosity with loop. Mild plaque at the common carotid and bifurcation. No stenosis or ulceration. Vertebral arteries: No proximal subclavian stenosis. There is bilateral subclavian atherosclerosis. Codominant vertebral arteries that are widely patent and smoothly contoured Skeleton: Facet osteoarthritis which is generalized with particularly bulky left-sided spurring at C4-5. Other neck: No evidence of mass or inflammation. Upper chest: Mild scar-like appearance in the upper lobes. Review of the MIP images confirms the above findings CTA HEAD FINDINGS Anterior circulation: Atheromatous plaque along the carotid siphons. No branch occlusion, beading, or aneurysm. No proximal flow limiting stenosis Posterior circulation: Vertebral and basilar arteries are smooth and widely  patent. Elongation of the basilar which contacts the ventral brain. No branch occlusion, beading, or aneurysm. No proximal significant stenosis. Venous sinuses: Patent Anatomic variants: None significant Review of the MIP images confirms the above findings  Prelim results were called by telephone at the time of interpretation on 06/15/2020 at 8:13 am to provider [place] . CT Brain Perfusion Findings: ASPECTS: 10 CBF (<30%) Volume: 37mL Perfusion (Tmax>6.0s) volume: 22mL. This area is 1 slice above the left petrous temporal bone and could be artifactual given history of pure motor weakness. As well, the area appears to extend to the medial temporal lobe, extending towards PCA distribution IMPRESSION: CTA: 1. No emergent finding. Negative for large vessel occlusion or flow reducing stenosis. 2. Moderate atherosclerosis in the head and neck. CT perfusion: No detected infarct. There is a 7 cc area highlighted in the inferior left temporal lobe, possibly artifactual given the provided history and location along the skull base. Electronically Signed   By: Monte Fantasia M.D.   On: 06/15/2020 08:22   ECHOCARDIOGRAM COMPLETE  Result Date: 06/16/2020    ECHOCARDIOGRAM REPORT   Patient Name:   Jacob Short Date of Exam: 06/16/2020 Medical Rec #:  [id number]       Height:       76.0 in Accession #:    [id number]      Weight:       181.0 lb Date of Birth:  08/30/1946        BSA:          2.123 m Patient Age:    1 years        BP:           183/103 mmHg Patient Gender: M               HR:           59 bpm. Exam Location:  Inpatient Procedure: 2D Echo, Cardiac Doppler and Color Doppler Indications:    Stroke 434.91 / I163.9  History:        Patient has no prior history of Echocardiogram examinations.                 CAD; Risk Factors:Diabetes.  Sonographer:    Bernadene Person RDCS Referring Phys: [id number] [place]  1. Left ventricular ejection fraction, by estimation, is 55 to 60%. The left ventricle has normal function. The left ventricle has no regional wall motion abnormalities. Left ventricular diastolic parameters are consistent with Grade I diastolic dysfunction (impaired relaxation).  2. Right ventricular systolic function is normal. The right  ventricular size is normal. Tricuspid regurgitation signal is inadequate for assessing PA pressure.  3. The mitral valve is grossly normal. Trivial mitral valve regurgitation. No evidence of mitral stenosis.  4. The aortic valve is tricuspid. Aortic valve regurgitation is not visualized. No aortic stenosis is present.  5. The inferior vena cava is normal in size with greater than 50% respiratory variability, suggesting right atrial pressure of 3 mmHg. Conclusion(s)/Recommendation(s): No intracardiac source of embolism detected on this transthoracic study. A transesophageal echocardiogram is recommended to exclude cardiac source of embolism if clinically indicated. FINDINGS  Left Ventricle: Left ventricular ejection fraction, by estimation, is 55 to 60%. The left ventricle has normal function. The left ventricle has no regional wall motion abnormalities. The left ventricular internal cavity size was normal in size. There is  no left ventricular hypertrophy. Left ventricular diastolic parameters are consistent with Grade I diastolic dysfunction (impaired relaxation). Normal  left ventricular filling pressure. Right Ventricle: The right ventricular size is normal. No increase in right ventricular wall thickness. Right ventricular systolic function is normal. Tricuspid regurgitation signal is inadequate for assessing PA pressure. Left Atrium: Left atrial size was normal in size. Right Atrium: Right atrial size was normal in size. Prominent Eustachian valve. Pericardium: Trivial pericardial effusion is present. Mitral Valve: The mitral valve is grossly normal. Trivial mitral valve regurgitation. No evidence of mitral valve stenosis. Tricuspid Valve: The tricuspid valve is grossly normal. Tricuspid valve regurgitation is trivial. No evidence of tricuspid stenosis. Aortic Valve: The aortic valve is tricuspid. Aortic valve regurgitation is not visualized. No aortic stenosis is present. Pulmonic Valve: The pulmonic valve was  grossly normal. Pulmonic valve regurgitation is not visualized. No evidence of pulmonic stenosis. Aorta: The aortic root and ascending aorta are structurally normal, with no evidence of dilitation. Venous: The inferior vena cava is normal in size with greater than 50% respiratory variability, suggesting right atrial pressure of 3 mmHg. IAS/Shunts: The atrial septum is grossly normal. EKG: Rhythm strip during this exam demostrated normal sinus rhythm and premature ventricular contractions.  LEFT VENTRICLE PLAX 2D LVIDd:         5.40 cm      Diastology LVIDs:         3.50 cm      LV e' medial:    4.30 cm/s LV PW:         1.30 cm      LV E/e' medial:  11.9 LV IVS:        1.00 cm      LV e' lateral:   7.54 cm/s LVOT diam:     2.40 cm      LV E/e' lateral: 6.8 LV SV:         92 LV SV Index:   43 LVOT Area:     4.52 cm  LV Volumes (MOD) LV vol d, MOD A2C: 118.0 ml LV vol d, MOD A4C: 118.0 ml LV vol s, MOD A2C: 65.3 ml LV vol s, MOD A4C: 66.1 ml LV SV MOD A2C:     52.7 ml LV SV MOD A4C:     118.0 ml LV SV MOD BP:      53.6 ml RIGHT VENTRICLE RV S prime:     7.14 cm/s TAPSE (M-mode): 1.6 cm LEFT ATRIUM             Index       RIGHT ATRIUM           Index LA diam:        3.10 cm 1.46 cm/m  RA Area:     14.50 cm LA Vol (A2C):   80.5 ml 37.91 ml/m RA Volume:   33.10 ml  15.59 ml/m LA Vol (A4C):   43.1 ml 20.30 ml/m LA Biplane Vol: 61.2 ml 28.82 ml/m  AORTIC VALVE LVOT Vmax:   95.50 cm/s LVOT Vmean:  64.750 cm/s LVOT VTI:    0.202 m  AORTA Ao Root diam: 3.50 cm Ao Asc diam:  3.30 cm MITRAL VALVE MV Area (PHT): 1.91 cm    SHUNTS MV Decel Time: 398 msec    Systemic VTI:  0.20 m MV E velocity: 51.00 cm/s  Systemic Diam: 2.40 cm MV A velocity: 67.90 cm/s MV E/A ratio:  0.75 Eleonore Chiquito MD Electronically signed by Eleonore Chiquito MD Signature Date/Time: 06/16/2020/10:58:42 AM    Final    CT HEAD CODE STROKE WO CONTRAST  Result  Date: 06/15/2020 CLINICAL DATA:  Code stroke.  Left-sided weakness EXAM: CT HEAD WITHOUT  CONTRAST TECHNIQUE: Contiguous axial images were obtained from the base of the skull through the vertex without intravenous contrast. COMPARISON:  None. FINDINGS: Brain: No evidence of acute infarction, hemorrhage, hydrocephalus, extra-axial collection or mass lesion/mass effect. Chronic lacunar infarct at the right corona radiata. Cerebral volume loss in keeping with aging. Vascular: No hyperdense vessel or unexpected calcification. Skull: Normal. Negative for fracture or focal lesion. Sinuses/Orbits: No acute finding. Other: These results were communicated to Dr. Rory Percy at 8:04 amon 9/23/2021by text page via the [facility] messaging system. ASPECTS [facility] Stroke Program Early CT Score) - Ganglionic level infarction (caudate, lentiform nuclei, internal capsule, insula, M1-M3 cortex): 7 - Supraganglionic infarction (M4-M6 cortex): 3 Total score (0-10 with 10 being normal): 10 IMPRESSION: 1. No acute finding.ASPECTS is 10. 2. Chronic lacunar infarct at the right corona radiata. Electronically Signed   By: Monte Fantasia M.D.   On: 06/15/2020 08:05     Discharge Instructions: Discharge Instructions    Ambulatory referral to Neurology   Complete by: As directed    Follow up with stroke clinic NP (Hassell Patras Vanschaick or Cecille Rubin, if both not available, consider Zachery Dauer, or [place]) at [facility] in about 4 weeks. Thanks.   Diet - low sodium heart healthy   Complete by: As directed    Discharge instructions   Complete by: As directed    You were hospitalized for stroke. Thank you for allowing [place] to be part of your care.   Please follow up with the following providers:  Follow-up Information    [facility]. Schedule an appointment as soon as  possible for a visit in 4 week(s).   Specialty: Neurology Contact information: [address] [place] [phone]   Please note these changes made to your medications:   - Medications to  continue: Losartan 25 mg daily and metformin 1000mg  in the morning and 1500 at night   - Medications to start: Aspirin 81 mg daily, Brilinta 90 mg twice daily until 07/19/2020, restart clopidegrel 75 mg daily on 07/20/2020, atorvastatin 80mg  daily   - Medications to discontinue:  Naproxen, ibuprofen-diphenhydramine, rosuvastatin   Increase activity slowly   Complete by: As directed       Signed: Iona Beard, MD 06/19/2020, 10:23 AM   Pager: [phone]

## 2020-06-19 NOTE — Progress Notes (Signed)
Occupational Therapy Treatment Patient Details Name: Jacob Short MRN: [id number] DOB: [birth year] Today's Date: 06/19/2020    History of present illness Pt is a 74 yo male admitted with R sided weakness.  Pt found to have a L corona radiata infarct.  PMH includes AAA, PVD, and DMII.   OT comments  Patient continues to make steady progress towards goals in skilled OT session. Patient's session encompassed neuromuscular re-education, bed mobility, and transfers. Pt continues to demonstrate increased deficits in RUE vs RLE, but is able to elevate and retract scapula with min tactile cues. Currently no movement is noticed in the remainder of the RUE. Session focused on sit<>stand tranfers with RUE in weight bearing with assist from therapist for placement. Pt continues to progress in balance and weight shifting with a RW, however demonstrated RLE buckling when taking steps forward and backward from the bed therefore elected to complete stand pivot with patient. Pt would continue to benefit from skilled therapy services in order to regain prior level, will continue to follow acutely.    Follow Up Recommendations  CIR;Supervision/Assistance - 24 hour    Equipment Recommendations  3 in 1 bedside commode    Recommendations for Other Services      Precautions / Restrictions Precautions Precautions: Fall Precaution Comments: R hemiparesis UE>LE Restrictions Weight Bearing Restrictions: No       Mobility Bed Mobility Overal bed mobility: Needs Assistance Bed Mobility: Supine to Sit     Supine to sit: Mod assist     General bed mobility comments: mod A to progress LEs and elevate trunk. Once up unable to scoot forward. Mod A to progress hips to EOB  Transfers Overall transfer level: Needs assistance Equipment used: Rolling walker (2 wheeled) Transfers: Sit to/from [facility] Sit to Stand: Min assist Stand pivot transfers: Min assist       General transfer  comment: Pt able to stand from with min A. With increased cues able to stand pivot transfer with min A    Balance Overall balance assessment: Needs assistance Sitting-balance support: Feet supported;Single extremity supported Sitting balance-Leahy Scale: Fair Sitting balance - Comments: posterior leaning with LE movement Postural control: Posterior lean;Left lateral lean Standing balance support: During functional activity;Single extremity supported Standing balance-Leahy Scale: Poor Standing balance comment: reliant on external support and therapists hand over top of RUE in order to maintain position on RW                           ADL either performed or assessed with clinical judgement   ADL Overall ADL's : Needs assistance/impaired                                     Functional mobility during ADLs: Moderate assistance;Rolling walker;Cueing for safety;Cueing for sequencing General ADL Comments: Session focus on neuromuscular re-education     Vision       Perception     Praxis      Cognition Arousal/Alertness: Awake/alert Behavior During Therapy: WFL for tasks assessed/performed Overall Cognitive Status: Within Functional Limits for tasks assessed                                 General Comments: pt remains motivated to progress        Exercises     Shoulder  Instructions       General Comments      Pertinent Vitals/ Pain       Pain Assessment: No/denies pain  Home Living                                          Prior Functioning/Environment              Frequency  Min 3X/week        Progress Toward Goals  OT Goals(current goals can now be found in the care plan section)  Progress towards OT goals: Progressing toward goals  Acute Rehab OT Goals Patient Stated Goal: to get my strength and independence back OT Goal Formulation: With patient Time For Goal Achievement:  06/30/20 Potential to Achieve Goals: Good  Plan Discharge plan remains appropriate    Co-evaluation                 AM-PAC OT "6 Clicks" Daily Activity     Outcome Measure   Help from another person eating meals?: A Little Help from another person taking care of personal grooming?: A Little Help from another person toileting, which includes using toliet, bedpan, or urinal?: A Lot Help from another person bathing (including washing, rinsing, drying)?: A Lot Help from another person to put on and taking off regular upper body clothing?: A Lot Help from another person to put on and taking off regular lower body clothing?: A Lot 6 Click Score: 14    End of Session Equipment Utilized During Treatment: Rolling walker;Gait belt  OT Visit Diagnosis: Unsteadiness on feet (R26.81);Hemiplegia and hemiparesis Hemiplegia - Right/Left: Right Hemiplegia - dominant/non-dominant: Dominant Hemiplegia - caused by: Cerebral infarction   Activity Tolerance Patient tolerated treatment well   Patient Left in chair;with call bell/phone within reach;with chair alarm set   Nurse Communication Mobility status        Time: 1000-1024 OT Time Calculation (min): 24 min  Charges: OT General Charges $OT Visit: 1 Visit OT Treatments $Neuromuscular Re-education: 23-37 mins  Corinne Ports E. Gabryel Talamo, COTA/L Acute Rehabilitation Services [phone] [place] 06/19/2020, 12:51 PM

## 2020-06-19 NOTE — Progress Notes (Signed)
Pt has been educated to [employer], [employer], and routines on rehab. Pt in bed eating dinner, with set up assist. No complaints

## 2020-06-19 NOTE — PMR Pre-admission (Signed)
PMR Admission Coordinator Pre-Admission Assessment  Patient: Jacob Short is an 74 y.o., male MRN: [id number] DOB: 02-22-1946 Height: _0  (193 cm) Weight: 84.5 kg              Insurance Information HMO:     PPO: yes     PCP:      IPA:      80/20:      OTHER:  PRIMARY: [employer]      Policy#: [id number]      Subscriber: pt CM Name: Sheria Lang      Phone#: [phone]     Fax#: EPIC  Pre-Cert#: [id number] auth for CIR provided by Sheria Lang with [facility] for admit 9/27 through 10/3.  [facility] has EPIC access.       Employer:  Benefits:  Phone #: [phone]     Name:  Eff. Date: 09/23/14     Deduct: $0      Out of Pocket Max: $3400 ($45 met)      Life Max: n/a  CIR: $295/day for days 1-6      SNF: 20 full days Outpatient:      Co-Pay: $15/visit Home Health: 100%      Co-Pay:  DME: 80%     Co-Pay: 20% Providers: preferred network  SECONDARY:       Policy#:       Phone#:   Development worker, community:       Phone#:   [employer]" for patients in Inpatient Rehabilitation Facilities with attached "Privacy Act [place] Records" was provided and verbally reviewed with: Patient and Family  Emergency Contact Information Contact Information    Name Relation Home Work Mobile   Grannis Significant other   [phone]     Current Medical History  Patient Admitting Diagnosis: L corona radiata infarct  History of Present Illness: Iktan Aikman is a 74 year old right-handed male with history of CAD and angioplasty maintained on Plavix, diabetes mellitus, tobacco abuse, AAA without rupture and hyperlipidemia.  Presented 06/15/2020 with right side weakness.  Cranial CT scan negative for acute changes.  Chronic lacunar infarct of the right corona radiata.  Patient did not receive TPA.  CT angiogram of head and neck with no emergent findings.  MRI showed acute lacunar infarct of the left corona radiata.  Echocardiogram with ejection fraction of 50 to 78% grade 1  diastolic dysfunction.  Admission chemistries unremarkable aside glucose 157, urine drug screen negative, urinalysis negative nitrite.  Follow neurology services noted given failure of aspirin Plavix recommendations are for aspirin 81 mg daily and Brilinta 90 mg twice daily x30 days and transition back to aspirin and Plavix.  Tolerating regular diet.  Therapy evaluations completed and patient was recommended for a comprehensive rehab program.  Complete NIHSS TOTAL: 5    Past Medical History  Past Medical History:  Diagnosis Date  . Coronary artery disease   . Diabetes mellitus without complication ([place])   . Peripheral vascular disease ([place])     Family History  family history is not on file.  Prior Rehab/Hospitalizations:  Has the patient had prior rehab or hospitalizations prior to admission? No  Has the patient had major surgery during 100 days prior to admission? No  Current Medications   Current Facility-Administered Medications:  .  acetaminophen (TYLENOL) tablet 650 mg, 650 mg, Oral, Q6H PRN **OR** acetaminophen (TYLENOL) suppository 650 mg, 650 mg, Rectal, Q6H PRN, Sherry Ruffing, Marissa M, MD .  aspirin EC tablet 81 mg, 81 mg, Oral, Daily,  Lacinda Axon, MD, 81 mg at 06/19/20 7253 .  atorvastatin (LIPITOR) tablet 80 mg, 80 mg, Oral, Daily, Asencion Noble, MD, 80 mg at 06/19/20 0906 .  enoxaparin (LOVENOX) injection 40 mg, 40 mg, Subcutaneous, Q24H, Sherry Ruffing, Marissa M, MD, 40 mg at 06/18/20 1500 .  losartan (COZAAR) tablet 25 mg, 25 mg, Oral, Daily, Iona Beard, MD, 25 mg at 06/19/20 0906 .  metFORMIN (GLUCOPHAGE) tablet 1,000 mg, 1,000 mg, Oral, Q breakfast, Iona Beard, MD, 1,000 mg at 06/19/20 0906 .  metFORMIN (GLUCOPHAGE) tablet 1,500 mg, 1,500 mg, Oral, Q supper, Iona Beard, MD, 1,500 mg at 06/18/20 1803 .  nicotine (NICODERM CQ - dosed in mg/24 hours) patch 21 mg, 21 mg, Transdermal, Daily, Sherry Ruffing, Marissa M, MD .  ticagrelor [facility]) tablet 90 mg, 90  mg, Oral, BID, Rosalin Hawking, MD, 90 mg at 06/19/20 6644  Patients Current Diet:  Diet Order            Diet - low sodium heart healthy           Diet Heart Room service appropriate? Yes; Fluid consistency: Thin  Diet effective now                 Precautions / Restrictions Precautions Precautions: Fall Precaution Comments: R hemiparesis UE>LE Restrictions Weight Bearing Restrictions: No   Has the patient had 2 or more falls or a fall with injury in the past year?No  Prior Activity Level Community (5-7x/wk): very active prior to admission, driving, no DME  Prior Functional Level Prior Function Level of Independence: Independent Comments: Pt fully independent PTA; pt and wife enjoy going on beach vacations, going to the pool  Self Care: Did the patient need help bathing, dressing, using the toilet or eating?  Independent  Indoor Mobility: Did the patient need assistance with walking from room to room (with or without device)? Independent  Stairs: Did the patient need assistance with internal or external stairs (with or without device)? Independent  Functional Cognition: Did the patient need help planning regular tasks such as shopping or remembering to take medications? Independent  Home Assistive Devices / Equipment Home Assistive Devices/Equipment: None Home Equipment: Shower seat  Prior Device Use: Indicate devices/aids used by the patient prior to current illness, exacerbation or injury? None of the above  Current Functional Level Cognition  Overall Cognitive Status: Within Functional Limits for tasks assessed Orientation Level: Oriented X4 General Comments: Pt very motivated and engaged, dry sense of humor and quick-witted.    Extremity Assessment (includes Sensation/Coordination)  Upper Extremity Assessment: Defer to OT evaluation RUE Deficits / Details: Shoulder 1/5, biceps 1/5, triceps 2/5, wrist/hand 0/5 RUE Sensation: WNL RUE Coordination: decreased  fine motor, decreased gross motor  Lower Extremity Assessment: RLE deficits/detail RLE Deficits / Details: 3/5 knee extension, 3/5 hip flexion assessed via SLR with <10* quad lag RLE Coordination: decreased fine motor, decreased gross motor    ADLs  Overall ADL's : Needs assistance/impaired Eating/Feeding: Minimal assistance, Sitting, Cueing for compensatory techinques Eating/Feeding Details (indicate cue type and reason): must use LUE to feed self and is R dominant. Grooming: Wash/dry face, Wash/dry hands, Oral care, Minimal assistance, Sitting, Cueing for compensatory techniques Grooming Details (indicate cue type and reason): pt does well but needs cues to use LUE for now. Upper Body Bathing: Sitting, Moderate assistance, Cueing for compensatory techniques Upper Body Bathing Details (indicate cue type and reason): min assist to get to all areas with  no functional use of RUE and min  guard for balance. Lower Body Bathing: Sit to/from stand, Moderate assistance, Cueing for compensatory techniques Lower Body Bathing Details (indicate cue type and reason): sit to stand with min assist but mod assist needed to maintain balance in standing. Upper Body Dressing : Moderate assistance, Sitting, Cueing for compensatory techniques Lower Body Dressing: Maximal assistance, Sit to/from stand, Cueing for compensatory techniques Lower Body Dressing Details (indicate cue type and reason): balance in sitting and standing limited when challengd. Toilet Transfer: Moderate assistance, Stand-pivot, BSC, RW Toilet Transfer Details (indicate cue type and reason): used walker with therapist assisting with [place] and Hygiene: Moderate assistance, Sit to/from stand, Cueing for compensatory techniques Functional mobility during ADLs: Moderate assistance, Rolling walker General ADL Comments: Pt very limited due to no functional use of RUE and LLE is weaker than LLE affecting balance and  safety.    Mobility  Overal bed mobility: Needs Assistance Bed Mobility: Supine to Sit Supine to sit: Min assist Sit to supine: Min assist General bed mobility comments: min A to progress LEs and elevate trunk. Once up unable to scoot forward. Mod A to progress hips to EOB    Transfers  Overall transfer level: Needs assistance Equipment used: 1 person hand held assist Transfers: Sit to/from Stand, Stand Pivot Transfers Sit to Stand: Min assist Stand pivot transfers: Mod assist General transfer comment: Pt able to stand from with min A. SPT to recliner via face to face required additional assist as R LE tends to buckle. Pt prematurally sitting into recliner chair.     Ambulation / Gait / Stairs / Wheelchair Mobility  Ambulation/Gait Ambulation/Gait assistance: +2 safety/equipment, Mod assist Gait Distance (Feet): 15 Feet (x3) Assistive device: 1 person hand held assist (+ LUE using chair rail in hallway as external support) Gait Pattern/deviations: Step-to pattern, Step-through pattern, Decreased stride length, Trunk flexed, Narrow base of support, Decreased stance time - right General Gait Details: Light mod A to ambulate in hallway. Pt requiring blocking at R knee during stance phase and assist for balance. VC to widen BOS. Gait velocity: decr    Posture / Balance Dynamic Sitting Balance Sitting balance - Comments: posterior leaning with LE movement Balance Overall balance assessment: Needs assistance Sitting-balance support: Feet supported, Single extremity supported Sitting balance-Leahy Scale: Fair Sitting balance - Comments: posterior leaning with LE movement Postural control: Posterior lean, Left lateral lean Standing balance support: During functional activity, Single extremity supported Standing balance-Leahy Scale: Poor Standing balance comment: reliant on external support    Special needs/care consideration Diabetic management yes and Designated visitor [place] (from acute therapy documentation) Living Arrangements: Spouse/significant other Available Help at Discharge: Family, Available 24 hours/day Type of Home: House Home Layout: One level Home Access: Stairs to enter Entrance Stairs-Rails: Right, Left Entrance Stairs-Number of Steps: 3 Bathroom Shower/Tub: Gaffer, Charity fundraiser: [place]: No  Discharge Living Setting Plans for Discharge Living Setting: Patient's home Type of Home at Discharge: House Discharge Home Layout: One level Discharge Home Access: Stairs to enter Entrance Stairs-Rails: Right, Left Entrance Stairs-Number of Steps: 3 Discharge Bathroom Shower/Tub: Walk-in shower Discharge Bathroom Toilet: Standard Discharge Bathroom Accessibility: Yes How Accessible: Accessible via walker Does the patient have any problems obtaining your medications?: No  Social/Family/Support Systems Patient Roles: Spouse Anticipated Caregiver: wife, Joelene Millin Anticipated Ambulance person Information: [phone] Ability/Limitations of Caregiver: n/a Caregiver Availability: 24/7 Discharge Plan Discussed with Primary Caregiver: Yes Is Caregiver In Agreement with Plan?: Yes  Does Caregiver/Family have Issues with Lodging/Transportation while Pt is in Rehab?: No   Goals Patient/Family Goal for Rehab: PT/OT mod I, SLP n/a Expected length of stay: 16-18 days Pt/Family Agrees to Admission and willing to participate: Yes Program Orientation Provided & Reviewed with Pt/Caregiver Including Roles  & Responsibilities: Yes  Barriers to Discharge: Insurance for SNF coverage   Decrease burden of Care through IP rehab admission: n/a  Possible need for SNF placement upon discharge: Not anticipated  Patient Condition: This patient's medical and functional status has changed since the consult dated: 06/16/20 in which the Rehabilitation Physician determined and documented that the  patient's condition is appropriate for intensive rehabilitative care in an inpatient rehabilitation facility. See "History of Present Illness" (above) for medical update. Functional changes are: mod assist +2 x15'. Patient's medical and functional status update has been discussed with the Rehabilitation physician and patient remains appropriate for inpatient rehabilitation. Will admit to inpatient rehab today.  Preadmission Screen Completed By:  Michel Santee, PT, 06/19/2020 10:19 AM ______________________________________________________________________   Discussed status with Dr. Ranell Patrick on 06/19/20 at 10:39 AM  and received approval for admission today.  Admission Coordinator:  Michel Santee, PT, DPT time 10:39 AM Sudie Grumbling 06/19/20

## 2020-06-19 NOTE — H&P (Signed)
Physical Medicine and Rehabilitation Admission H&P    Chief Complaint  Patient presents with  . Code Stroke  : HPI: Jacob Short is a 74 year old right-handed male with history of CAD and angioplasty maintained on Plavix, diabetes mellitus, tobacco abuse, AAA without rupture and hyperlipidemia.  Per chart review patient lives with spouse.  Independent prior to admission.  1 level home 3 steps to entry.  Presented 06/15/2020 with right side weakness.  Cranial CT scan negative for acute changes.  Chronic lacunar infarct of the right corona radiata.  Patient did not receive TPA.  CT angiogram of head and neck with no emergent findings.  MRI showed acute lacunar infarct of the left corona radiata.  Echocardiogram with ejection fraction of 50 to 96% grade 1 diastolic dysfunction.  Admission chemistries unremarkable aside glucose 157, urine drug screen negative, urinalysis negative nitrite.  Follow neurology services noted given failure of aspirin Plavix recommendations are for aspirin 81 mg daily and Brilinta 90 mg twice daily x30 days and transition back to aspirin and Plavix.  Tolerating regular diet.  Therapy evaluations completed and patient was admitted for a comprehensive rehab program.  Review of Systems  Constitutional: Negative for chills and fever.  HENT: Negative for hearing loss.   Eyes: Negative for blurred vision, double vision and photophobia.  Respiratory: Negative for shortness of breath.   Cardiovascular: Negative for chest pain, palpitations and leg swelling.  Gastrointestinal: Positive for constipation. Negative for heartburn, nausea and vomiting.  Genitourinary: Negative for dysuria, flank pain and hematuria.  Musculoskeletal: Positive for myalgias.  Skin: Negative for rash.  Neurological: Positive for weakness.  All other systems reviewed and are negative.  Past Medical History:  Diagnosis Date  . Coronary artery disease   . Diabetes mellitus without complication  ([place])   . Peripheral vascular disease [facility])    Past Surgical History:  Procedure Laterality Date  . CORONARY ANGIOPLASTY     History reviewed. No pertinent family history. Social History:  reports that he quit smoking 4 days ago. He uses smokeless tobacco. He reports previous alcohol use. He reports previous drug use. Allergies: No Known Allergies Medications Prior to Admission  Medication Sig Dispense Refill  . aspirin 325 MG tablet Take 325 mg by mouth daily.    . clopidogrel (PLAVIX) 75 MG tablet Take 75 mg by mouth daily.    . Ibuprofen-diphenhydrAMINE HCl 200-25 MG CAPS Take 1-2 tablets by mouth at bedtime as needed (pain and sleep).    . losartan (COZAAR) 25 MG tablet Take 25 mg by mouth daily.    . metFORMIN (GLUCOPHAGE) 500 MG tablet Take 1,000-1,500 mg by mouth See admin instructions. Take 1000mg  in the morning and 1500mg  in the evening.    . naproxen sodium (ALEVE) 220 MG tablet Take 220 mg by mouth 2 (two) times daily as needed (pain).    . rosuvastatin (CRESTOR) 10 MG tablet Take 10 mg by mouth at bedtime.      Drug Regimen Review Drug regimen was reviewed and remains appropriate with no significant issues identified  Home: Home Living Family/patient expects to be discharged to:: Private residence Living Arrangements: Spouse/significant other Available Help at Discharge: Family, Available 24 hours/day Type of Home: House Home Access: Stairs to enter [employer] of Steps: 3 Entrance Stairs-Rails: Right, Left Home Layout: One level Bathroom Shower/Tub: Gaffer, Charity fundraiser: Standard Home Equipment: Industrial/product designer History: Prior Function Level of Independence: Independent Comments: Pt fully independent PTA; pt and  wife enjoy going on beach vacations, going to the pool  Functional Status:  Mobility: Bed Mobility Overal bed mobility: Needs Assistance Bed Mobility: Supine to Sit Supine to sit: Min assist Sit to supine: Min  assist General bed mobility comments: min A to progress LEs and elevate trunk. Once up unable to scoot forward. Mod A to progress hips to EOB Transfers Overall transfer level: Needs assistance Equipment used: 1 person hand held assist Transfers: Sit to/from Stand, Stand Pivot Transfers Sit to Stand: Min assist Stand pivot transfers: Mod assist General transfer comment: Pt able to stand from with min A. SPT to recliner via face to face required additional assist as R LE tends to buckle. Pt prematurally sitting into recliner chair.  Ambulation/Gait Ambulation/Gait assistance: +2 safety/equipment, Mod assist Gait Distance (Feet): 15 Feet (x3) Assistive device: 1 person hand held assist (+ LUE using chair rail in hallway as external support) Gait Pattern/deviations: Step-to pattern, Step-through pattern, Decreased stride length, Trunk flexed, Narrow base of support, Decreased stance time - right General Gait Details: Light mod A to ambulate in hallway. Pt requiring blocking at R knee during stance phase and assist for balance. VC to widen BOS. Gait velocity: decr    ADL: ADL Overall ADL's : Needs assistance/impaired Eating/Feeding: Minimal assistance, Sitting, Cueing for compensatory techinques Eating/Feeding Details (indicate cue type and reason): must use LUE to feed self and is R dominant. Grooming: Wash/dry face, Wash/dry hands, Oral care, Minimal assistance, Sitting, Cueing for compensatory techniques Grooming Details (indicate cue type and reason): pt does well but needs cues to use LUE for now. Upper Body Bathing: Sitting, Moderate assistance, Cueing for compensatory techniques Upper Body Bathing Details (indicate cue type and reason): min assist to get to all areas with  no functional use of RUE and min guard for balance. Lower Body Bathing: Sit to/from stand, Moderate assistance, Cueing for compensatory techniques Lower Body Bathing Details (indicate cue type and reason): sit to  stand with min assist but mod assist needed to maintain balance in standing. Upper Body Dressing : Moderate assistance, Sitting, Cueing for compensatory techniques Lower Body Dressing: Maximal assistance, Sit to/from stand, Cueing for compensatory techniques Lower Body Dressing Details (indicate cue type and reason): balance in sitting and standing limited when challengd. Toilet Transfer: Moderate assistance, Stand-pivot, BSC, RW Toilet Transfer Details (indicate cue type and reason): used walker with therapist assisting with [place] and Hygiene: Moderate assistance, Sit to/from stand, Cueing for compensatory techniques Functional mobility during ADLs: Moderate assistance, Rolling walker General ADL Comments: Pt very limited due to no functional use of RUE and LLE is weaker than LLE affecting balance and safety.  Cognition: Cognition Overall Cognitive Status: Within Functional Limits for tasks assessed Orientation Level: Oriented X4 Cognition Arousal/Alertness: Awake/alert Behavior During Therapy: WFL for tasks assessed/performed Overall Cognitive Status: Within Functional Limits for tasks assessed General Comments: Pt very motivated and engaged, dry sense of humor and quick-witted.  Physical Exam: Blood pressure (!) 150/80, pulse 79, temperature 98.7 F (37.1 C), temperature source Oral, resp. rate 18, height 6\' 4"  (1.93 m), weight 84.5 kg, SpO2 99 %. Physical Exam  General: Alert and oriented x 3, No apparent distress HEENT: Head is normocephalic, atraumatic, PERRLA, EOMI, sclera anicteric, oral mucosa pink and moist, dentition intact, ext ear canals clear,  Neck: Supple without JVD or lymphadenopathy Heart: Reg rate and rhythm. No murmurs rubs or gallops Chest: CTA bilaterally without wheezes, rales, or rhonchi; no distress Abdomen: Soft, non-tender, non-distended, bowel sounds  positive. Extremities: No clubbing, cyanosis, or edema. Pulses are 2+ Skin:  Clean and intact without signs of breakdown Neuro: Patient is alert in no acute distress.  Mild dysarthria but fully intelligible.  Oriented x3.  Fair awareness of deficits. R facial droop. RUE w/ 1/5 SA, EF, 2/5 EE, 0/5 WE and hand grip. RLE 4+/5 throughout, L sided strength intact Musculoskeletal: Full ROM, No pain with AROM or PROM in the neck, trunk, or extremities. Posture appropriate Psych: Pt's affect is appropriate. Pt is cooperative   Results for orders placed or performed during the hospital encounter of 06/15/20 (from the past 48 hour(s))  Glucose, capillary     Status: Abnormal   Collection Time: 06/17/20 12:02 PM  Result Value Ref Range   Glucose-Capillary 194 (H) 70 - 99 mg/dL    Comment: Glucose reference range applies only to samples taken after fasting for at least 8 hours.   Comment 1 Notify RN    Comment 2 Document in Chart   Glucose, capillary     Status: Abnormal   Collection Time: 06/17/20  4:18 PM  Result Value Ref Range   Glucose-Capillary 190 (H) 70 - 99 mg/dL    Comment: Glucose reference range applies only to samples taken after fasting for at least 8 hours.   Comment 1 Notify RN    Comment 2 Document in Chart   Glucose, capillary     Status: Abnormal   Collection Time: 06/17/20  9:02 PM  Result Value Ref Range   Glucose-Capillary 200 (H) 70 - 99 mg/dL    Comment: Glucose reference range applies only to samples taken after fasting for at least 8 hours.  CBC     Status: None   Collection Time: 06/18/20  4:31 AM  Result Value Ref Range   WBC 9.2 4.0 - 10.5 K/uL   RBC 4.47 4.22 - 5.81 MIL/uL   Hemoglobin 14.7 13.0 - 17.0 g/dL   HCT 43.7 39 - 52 %   MCV 97.8 80.0 - 100.0 fL   MCH 32.9 26.0 - 34.0 pg   MCHC 33.6 30.0 - 36.0 g/dL   RDW 13.9 11.5 - 15.5 %   Platelets 228 150 - 400 K/uL   nRBC 0.0 0.0 - 0.2 %    Comment: Performed at [facility], [place] [address] [place], [place] [address]  Basic metabolic panel     Status: Abnormal   Collection Time:  06/18/20  4:31 AM  Result Value Ref Range   Sodium 136 135 - 145 mmol/L   Potassium 3.8 3.5 - 5.1 mmol/L   Chloride 103 98 - 111 mmol/L   CO2 24 22 - 32 mmol/L   Glucose, Bld 179 (H) 70 - 99 mg/dL    Comment: Glucose reference range applies only to samples taken after fasting for at least 8 hours.   BUN 9 8 - 23 mg/dL   Creatinine, Ser 0.70 0.61 - 1.24 mg/dL   Calcium 8.7 (L) 8.9 - 10.3 mg/dL   GFR calc non Af Amer >60 >60 mL/min   GFR calc Af Amer >60 >60 mL/min   Anion gap 9 5 - 15    Comment: Performed at [facility] [address] [place], [place] [address]  Glucose, capillary     Status: Abnormal   Collection Time: 06/18/20  6:16 AM  Result Value Ref Range   Glucose-Capillary 174 (H) 70 - 99 mg/dL    Comment: Glucose reference range applies only to  samples taken after fasting for at least 8 hours.  Glucose, capillary     Status: Abnormal   Collection Time: 06/19/20  6:21 AM  Result Value Ref Range   Glucose-Capillary 134 (H) 70 - 99 mg/dL    Comment: Glucose reference range applies only to samples taken after fasting for at least 8 hours.   No results found.     Medical Problem List and Plan: 1.  Left side weakness secondary to left corona radiata infarct secondary small vessel disease  -patient may shower  -ELOS/Goals: 14-16 days 2.  Antithrombotics: -DVT/anticoagulation: Lovenox.  -antiplatelet therapy: Current plan is for aspirin 81 mg daily and Brilinta 90 mg twice daily x30 days then transition back to aspirin and Plavix 3. Pain Management: Denies pain.  4. Mood: Provide emotional support  -antipsychotic agents: N/A 5. Neuropsych: This patient is capable of making decisions on his own behalf. 6. Skin/Wound Care: Routine skin checks 7. Fluids/Electrolytes/Nutrition: Routine in and outs with follow-up chemistries 8.  CAD with angioplasty.  Continue aspirin. 9.  Diabetes mellitus with peripheral neuropathy.  Hemoglobin A1c 6.9.  Glucophage 1000 mg at  breakfast 1500 mg with supper.    9/27: Slightly elevated. Advised to minimize added sugar in diet.  10.  Hyperlipidemia.  Lipitor 11.  Tobacco abuse.  No longer using the NicoDerm patch! Wife does smoke but she she states will stop when he returns home. Provide counseling 12.  Hypertension.  Cozaar 25 mg daily.  Monitor with increased mobility 13.  History of AAA without rupture.  Follow-up outpatient monitoring.  Lavon Paganini Angiulli, PA-C 06/19/2020   I have personally performed a face to face diagnostic evaluation, including, but not limited to relevant history and physical exam findings, of this patient and developed relevant assessment and plan.  Additionally, I have reviewed and concur with the physician assistant's documentation above.  Leeroy Cha, MD

## 2020-06-19 NOTE — Progress Notes (Signed)
Inpatient Rehabilitation Medication Review by a Pharmacist  A complete drug regimen review was completed for this patient to identify any potential clinically significant medication issues.  Clinically significant medication issues were identified:  no  Check AMION for pharmacist assigned to patient if future medication questions/issues arise during this admission.  Pharmacist comments:   Time spent performing this drug regimen review (minutes):  10   Hansini Clodfelter A. Levada Dy, PharmD, BCPS, FNKF Clinical Pharmacist [place] Please utilize Amion for appropriate phone number to reach the unit pharmacist ([place])  06/19/2020 6:19 PM

## 2020-06-19 NOTE — Progress Notes (Signed)
Inpatient Rehab Admissions Coordinator:   I have insurance authorization and a bed for pt to admit to CIR today.  IMTS in agreement.  Will let pt/family and TOC team know.   Shann Medal, PT, DPT Admissions Coordinator [phone] 06/19/20  9:46 AM

## 2020-06-20 ENCOUNTER — Inpatient Hospital Stay (HOSPITAL_COMMUNITY): Payer: PPO

## 2020-06-20 ENCOUNTER — Inpatient Hospital Stay (HOSPITAL_COMMUNITY): Payer: PPO | Admitting: Occupational Therapy

## 2020-06-20 DIAGNOSIS — E46 Unspecified protein-calorie malnutrition: Secondary | ICD-10-CM

## 2020-06-20 DIAGNOSIS — I10 Essential (primary) hypertension: Secondary | ICD-10-CM

## 2020-06-20 DIAGNOSIS — E871 Hypo-osmolality and hyponatremia: Secondary | ICD-10-CM

## 2020-06-20 DIAGNOSIS — E8809 Other disorders of plasma-protein metabolism, not elsewhere classified: Secondary | ICD-10-CM

## 2020-06-20 DIAGNOSIS — I679 Cerebrovascular disease, unspecified: Secondary | ICD-10-CM

## 2020-06-20 DIAGNOSIS — E1142 Type 2 diabetes mellitus with diabetic polyneuropathy: Secondary | ICD-10-CM

## 2020-06-20 DIAGNOSIS — D72829 Elevated white blood cell count, unspecified: Secondary | ICD-10-CM

## 2020-06-20 LAB — COMPREHENSIVE METABOLIC PANEL
ALT: 8 U/L (ref 0–44)
AST: 11 U/L — ABNORMAL LOW (ref 15–41)
Albumin: 3.4 g/dL — ABNORMAL LOW (ref 3.5–5.0)
Alkaline Phosphatase: 80 U/L (ref 38–126)
Anion gap: 9 (ref 5–15)
BUN: 9 mg/dL (ref 8–23)
CO2: 23 mmol/L (ref 22–32)
Calcium: 8.9 mg/dL (ref 8.9–10.3)
Chloride: 102 mmol/L (ref 98–111)
Creatinine, Ser: 0.56 mg/dL — ABNORMAL LOW (ref 0.61–1.24)
GFR calc Af Amer: >60 mL/min (ref >60)
GFR calc non Af Amer: >60 mL/min (ref >60)
Glucose, Bld: 146 mg/dL — ABNORMAL HIGH (ref 70–99)
Potassium: 3.8 mmol/L (ref 3.5–5.1)
Sodium: 134 mmol/L — ABNORMAL LOW (ref 135–145)
Total Bilirubin: 1.8 mg/dL — ABNORMAL HIGH (ref 0.3–1.2)
Total Protein: 6.3 g/dL — ABNORMAL LOW (ref 6.5–8.1)

## 2020-06-20 LAB — GLUCOSE, CAPILLARY
Glucose-Capillary: 145 mg/dL — ABNORMAL HIGH (ref 70–99)
Glucose-Capillary: 118 mg/dL — ABNORMAL HIGH (ref 70–99)
Glucose-Capillary: 107 mg/dL — ABNORMAL HIGH (ref 70–99)
Glucose-Capillary: 135 mg/dL — ABNORMAL HIGH (ref 70–99)

## 2020-06-20 LAB — CBC WITH DIFFERENTIAL/PLATELET
Abs Immature Granulocytes: 0.03 10*3/uL (ref 0.00–0.07)
Basophils Absolute: 0 10*3/uL (ref 0.0–0.1)
Basophils Relative: 0 %
Eosinophils Absolute: 0.3 10*3/uL (ref 0.0–0.5)
Eosinophils Relative: 2 %
HCT: 42.7 % (ref 39.0–52.0)
Hemoglobin: 14.2 g/dL (ref 13.0–17.0)
Immature Granulocytes: 0 %
Lymphocytes Relative: 8 %
Lymphs Abs: 1 10*3/uL (ref 0.7–4.0)
MCH: 32.6 pg (ref 26.0–34.0)
MCHC: 33.3 g/dL (ref 30.0–36.0)
MCV: 97.9 fL (ref 80.0–100.0)
Monocytes Absolute: 1.7 10*3/uL — ABNORMAL HIGH (ref 0.1–1.0)
Monocytes Relative: 13 %
Neutro Abs: 10 10*3/uL — ABNORMAL HIGH (ref 1.7–7.7)
Neutrophils Relative %: 77 %
Platelets: 217 10*3/uL (ref 150–400)
RBC: 4.36 MIL/uL (ref 4.22–5.81)
RDW: 13.8 % (ref 11.5–15.5)
WBC: 13 10*3/uL — ABNORMAL HIGH (ref 4.0–10.5)
nRBC: 0 % (ref 0.0–0.2)

## 2020-06-20 MED ORDER — PROSOURCE PLUS PO LIQD
30.0000 mL | Freq: Two times a day (BID) | ORAL | Status: DC
  Administered 2020-06-20 – 2020-07-10 (×39): 30 mL via ORAL
  Filled 2020-06-20 (×38): qty 30

## 2020-06-20 NOTE — Progress Notes (Signed)
Inpatient Rehabilitation Center Individual Statement of Services  Patient Name:  Jacob Short  Date:  06/20/2020  Welcome to the [facility].  Our goal is to provide you with an individualized program based on your diagnosis and situation, designed to meet your specific needs.  With this comprehensive rehabilitation program, you will be expected to participate in at least 3 hours of rehabilitation therapies Monday-Friday, with modified therapy programming on the weekends.  Your rehabilitation program will include the following services:  Physical Therapy (PT), Occupational Therapy (OT), 24 hour per day rehabilitation nursing, Neuropsychology, Care Coordinator, Rehabilitation Medicine, Nutrition Services and Pharmacy Services  Weekly team conferences will be held on Wednesday to discuss your progress.  Your Inpatient Rehabilitation Care Coordinator will talk with you frequently to get your input and to update you on team discussions.  Team conferences with you and your family in attendance may also be held.  Expected length of stay: 21 days  Overall anticipated outcome: supervision-CGA level  Depending on your progress and recovery, your program may change. Your Inpatient Rehabilitation Care Coordinator will coordinate services and will keep you informed of any changes. Your Inpatient Rehabilitation Care Coordinator's name and contact numbers are listed  below.  The following services may also be recommended but are not provided by the [facility] will be made to provide these services after discharge if needed.  Arrangements include referral to agencies that provide these services.  Your insurance has been verified to be:  [employer] Your primary doctor is:  None  Pertinent information will be shared with your doctor and  your insurance company.  Inpatient Rehabilitation Care Coordinator:  Ovidio Kin, [place] or Emilia Beck  Information discussed with and copy given to patient by: Elease Hashimoto, 06/20/2020, 11:38 AM

## 2020-06-20 NOTE — Evaluation (Signed)
Occupational Therapy Assessment and Plan  Patient Details  Name: Jacob Short MRN: [id number] Date of Birth: Dec 02, 1945  OT Diagnosis: abnormal posture, cognitive deficits, disturbance of vision and hemiplegia affecting dominant side Rehab Potential: Rehab Potential (ACUTE ONLY): Good ELOS: 3 weeks   Today's Date: 06/20/2020 OT Individual Time: 1101-1201 OT Individual Time Calculation (min): 60 min     Hospital Problem: Principal Problem:   Small vessel disease, cerebrovascular Active Problems:   Leukocytosis   Hypoalbuminemia due to protein-calorie malnutrition ([place])   Hyponatremia   Essential hypertension   Diabetic peripheral neuropathy ([place])   Past Medical History:  Past Medical History:  Diagnosis Date   Coronary artery disease    Diabetes mellitus without complication ([place])    Peripheral vascular disease ([place])    Past Surgical History:  Past Surgical History:  Procedure Laterality Date   CORONARY ANGIOPLASTY      Assessment & Plan Clinical Impression: Jacob Short is a 74 year old right-handed male with history of CAD and angioplasty maintained on Plavix, diabetes mellitus, tobacco abuse, AAA without rupture and hyperlipidemia.  Per chart review patient lives with spouse.  Independent prior to admission.  1 level home 3 steps to entry.  Presented 06/15/2020 with right side weakness.  Cranial CT scan negative for acute changes.  Chronic lacunar infarct of the right corona radiata.  Patient did not receive TPA.  CT angiogram of head and neck with no emergent findings.  MRI showed acute lacunar infarct of the left corona radiata.  Echocardiogram with ejection fraction of 50 to 48% grade 1 diastolic dysfunction.  Admission chemistries unremarkable aside glucose 157, urine drug screen negative, urinalysis negative nitrite.  Follow neurology services noted given failure of aspirin Plavix recommendations are for aspirin 81 mg daily and Brilinta 90 mg twice daily x30 days  and transition back to aspirin and Plavix.  Tolerating regular diet.   Patient transferred to CIR on 06/19/2020 .    Patient currently requires mod with basic self-care skills secondary to muscle weakness, decreased cardiorespiratoy endurance, decreased coordination and decreased motor planning, decreased visual acuity and decreased visual motor skills, decreased motor planning and ideational apraxia, decreased problem solving and decreased safety awareness and decreased sitting balance, decreased standing balance, decreased postural control, hemiplegia and decreased balance strategies.  Prior to hospitalization, patient could complete ADLs and IADLs with independent .  Patient will benefit from skilled intervention to increase independence with basic self-care skills prior to discharge home with care partner.  Anticipate patient will require intermittent supervision and home health versus outpatient OT.  OT - End of Session Activity Tolerance: Tolerates 30+ min activity with multiple rests Endurance Deficit: Yes Endurance Deficit Description: Benefits from brief seated rest breaks OT Assessment Rehab Potential (ACUTE ONLY): Good OT Patient demonstrates impairments in the following area(s): Balance;Perception;Safety;Cognition;Endurance;Vision;Motor OT Basic ADL's Functional Problem(s): Grooming;Bathing;Dressing;Toileting OT Advanced ADL's Functional Problem(s): Simple Meal Preparation OT Transfers Functional Problem(s): Toilet;Tub/Shower OT Additional Impairment(s): Fuctional Use of Upper Extremity OT Plan OT Intensity: Minimum of 1-2 x/day, 45 to 90 minutes OT Frequency: 5 out of 7 days OT Duration/Estimated Length of Stay: 3 weeks OT Treatment/Interventions: Balance/vestibular training;Discharge planning;Self Care/advanced ADL retraining;Therapeutic Activities;UE/LE Coordination activities;Cognitive remediation/compensation;Disease mangement/prevention;Functional mobility  training;Patient/family education;Therapeutic Exercise;Visual/perceptual remediation/compensation;DME/adaptive equipment instruction;Neuromuscular re-education;Psychosocial support;UE/LE Strength taining/ROM;Wheelchair propulsion/positioning OT Self Feeding Anticipated Outcome(s): setup OT Basic Self-Care Anticipated Outcome(s): supervision OT Toileting Anticipated Outcome(s): supervision OT Bathroom Transfers Anticipated Outcome(s): supervision OT Recommendation Recommendations for Other Services: Therapeutic Recreation consult Therapeutic Recreation Interventions: Other (comment) (leisure) Patient destination:  Home Follow Up Recommendations: Other (comment) (home health versus outpatient)   OT Evaluation Precautions/Restrictions  Precautions Precautions: Fall Precaution Comments: R hemiparesis UE>LE Restrictions Weight Bearing Restrictions: No General Chart Reviewed: Yes Pain Pain Assessment Pain Scale: 0-10 Pain Score: 0-No pain Home Living/Prior Functioning Home Living Family/patient expects to be discharged to:: Private residence Living Arrangements: Spouse/significant other Available Help at Discharge: Family, Available 24 hours/day Type of Home: House Home Access: Stairs to enter Technical brewer of Steps: 4 Entrance Stairs-Rails: Right Home Layout: One level Bathroom Shower/Tub: Gaffer, Charity fundraiser: Standard  Lives With: Spouse IADL History Occupation: Retired Prior Function Level of Independence: Independent with gait, Independent with basic ADLs, Independent with homemaking with ambulation  Able to Take Stairs?: Yes Driving: Yes Vocation: Retired Surveyor, mining Baseline Vision/History: No visual deficits;Wears glasses Wears Glasses: Reading only Patient Visual Report: Blurring of vision (mild per pt) Vision Assessment?: No apparent visual deficits Perception  Perception: Impaired Comments: often times leaving RUE behind while performing bed  mobility or transfers. No overt R inattention Praxis Praxis: Impaired Praxis Impairment Details: Ideomotor;Motor planning Cognition Overall Cognitive Status: Within Functional Limits for tasks assessed Arousal/Alertness: Awake/alert Orientation Level: Person;Place;Situation Person: Oriented Place: Oriented Situation: Oriented Year: 2021 Month: September Day of Week: Correct Memory: Appears intact Immediate Memory Recall: Sock;Blue;Bed Memory Recall Sock: Without Cue Memory Recall Blue: Without Cue Memory Recall Bed: Without Cue Attention: Sustained;Focused Focused Attention: Appears intact Sustained Attention: Appears intact Awareness: Appears intact Problem Solving: Impaired Problem Solving Impairment: Functional basic;Functional complex Executive Function: Decision Making;Initiating;Sequencing Sequencing: Appears intact Decision Making: Appears intact Initiating: Appears intact Safety/Judgment: Impaired Sensation Sensation Light Touch: Appears Intact Hot/Cold: Appears Intact Proprioception: Impaired by gross assessment Coordination Gross Motor Movements are Fluid and Coordinated: No Fine Motor Movements are Fluid and Coordinated: No Finger Nose Finger Test: UTA RUE due to weakness; WNL LUE Motor  Motor Motor: Motor apraxia;Abnormal postural alignment and control;Hemiplegia Motor - Skilled Clinical Observations: R lateral lean in sitting/standing, R HB weakness (UE > LE)  Trunk/Postural Assessment  Cervical Assessment Cervical Assessment: Exceptions to [facility] (forward head) Thoracic Assessment Thoracic Assessment: Exceptions to [facility] (mild kyphosis) Lumbar Assessment Lumbar Assessment: Exceptions to [facility] (sacral sitting and posterior pelvic tilt) Postural Control Postural Control: Deficits on evaluation (right lateral lean sitting and standing)  Balance Balance Balance Assessed: Yes Static Sitting Balance Static Sitting - Level of Assistance: 4: Min assist Dynamic  Sitting Balance Dynamic Sitting - Level of Assistance: 3: Mod assist Sitting balance - Comments: posterior leaning with LE movement Static Standing Balance Static Standing - Level of Assistance: 3: Mod assist Dynamic Standing Balance Dynamic Standing - Level of Assistance: 3: Mod assist Extremity/Trunk Assessment RUE Assessment RUE Assessment: Exceptions to [facility] Passive Range of Motion (PROM) Comments: WNL Active Range of Motion (AROM) Comments: Against gravity: Shoulder abd: 20 degrees ; elbow flex: 50 degrees; triceps 0 degrees General Strength Comments: no grasp or wrist, forearm 2-/5, bicep 2/5, tricep 3/5, shldr abd 2-/5 LUE Assessment LUE Assessment: Within Functional Limits  Care Tool Care Tool Self Care Eating   Eating Assist Level: Set up assist    Oral Care    Oral Care Assist Level: Set up assist    Bathing         Assist Level: Moderate Assistance - Patient 50 - 74%    Upper Body Dressing(including orthotics)   What is the patient wearing?: Pull over shirt   Assist Level: Moderate Assistance - Patient 50 - 74%    Lower Body  Dressing (excluding footwear)   What is the patient wearing?: Pants;Incontinence brief Assist for lower body dressing: Maximal Assistance - Patient 25 - 49%    Putting on/Taking off footwear     Assist for footwear: Maximal Assistance - Patient 25 - 49%       Care Tool Toileting Toileting activity         Care Tool Bed Mobility Roll left and right activity        Sit to lying activity        Lying to sitting edge of bed activity         Care Tool Transfers Sit to stand transfer        Chair/bed transfer         Toilet transfer   Assist Level: Moderate Assistance - Patient 50 - 74%     Care Tool Cognition Expression of Ideas and Wants Expression of Ideas and Wants: Without difficulty (complex and basic) - expresses complex messages without difficulty and with speech that is clear and easy to understand    Understanding Verbal and Non-Verbal Content Understanding Verbal and Non-Verbal Content: Understands (complex and basic) - clear comprehension without cues or repetitions   Memory/Recall Ability *first 3 days only Memory/Recall Ability *first 3 days only: Current season;Location of own room;That he or she is in a hospital/hospital unit    Refer to Care Plan for [place] 1 OT Short Term Goal 1 (Week 1): Pt will achieve 75 degrees Active abduction during dressing and bathing right shoulder. OT Short Term Goal 2 (Week 1): Pt will complete LB dressing with min assist. OT Short Term Goal 3 (Week 1): Pt will complete toilet transfer with min assist. OT Short Term Goal 4 (Week 1): Pt will complete LB bathing with min assist.  Recommendations for other services: Therapeutic Recreation  Other leisure   Skilled Therapeutic Intervention ADL ADL Grooming: Setup Where Assessed-Grooming: Sitting at sink Upper Body Bathing: Minimal assistance Where Assessed-Upper Body Bathing: Sitting at sink Lower Body Bathing: Moderate assistance Where Assessed-Lower Body Bathing: Standing at sink;Sitting at sink Upper Body Dressing: Minimal assistance Where Assessed-Upper Body Dressing: Sitting at sink Lower Body Dressing: Moderate assistance Where Assessed-Lower Body Dressing: Standing at sink;Sitting at sink Toilet Transfer: Moderate assistance Toilet Transfer Method: Squat pivot Toilet Transfer Equipment: Drop arm bedside commode Tub/Shower Transfer: Not assessed Tub/Shower Transfer Method: Unable to assess [employer] Transfer: Not assessed Mobility  Transfers Sit to Stand: Moderate Assistance - Patient 50-74% Stand to Sit: Moderate Assistance - Patient 50-74%   Skilled Intervention:  Pt sitting up in w/c, no c/o pain, agreeable to OT session.  OT evaluation complete, educated pt regarding OT scope of practice, and collaborated with pt regarding POC.  Self care  completed at sinkside and functional mobility per levels of assist listed above.  Provided right half lap tray to promote RUE protection and support.  Pt sitting up in w/c, call bell in reach, seat belt alarm on.     Discharge Criteria: Patient will be discharged from OT if patient refuses treatment 3 consecutive times without medical reason, if treatment goals not met, if there is a change in medical status, if patient makes no progress towards goals or if patient is discharged from hospital.  The above assessment, treatment plan, treatment alternatives and goals were discussed and mutually agreed upon: by patient  [name] 06/20/2020, 3:54 PM

## 2020-06-20 NOTE — Progress Notes (Signed)
Orthopedic Tech Progress Note Patient Details:  Shine Mikes [facility] [birth year] [id number] Called in order to [employer] for a [employer]ING [employer] Patient ID: DAMAREE SARGENT, male   DOB: 1946/01/11, 74 y.o.   MRN: [id number]   Janit Pagan 06/20/2020, 1:59 PM

## 2020-06-20 NOTE — Progress Notes (Signed)
Patient Details  Name: Jacob Short MRN: [id number] Date of Birth: Feb 10, 1946  Today's Date: 06/20/2020  Hospital Problems: Principal Problem:   Small vessel disease, cerebrovascular  Past Medical History:  Past Medical History:  Diagnosis Date  . Coronary artery disease   . Diabetes mellitus without complication ([place])   . Peripheral vascular disease [facility])    Past Surgical History:  Past Surgical History:  Procedure Laterality Date  . CORONARY ANGIOPLASTY     Social History:  reports that he quit smoking 5 days ago. He uses smokeless tobacco. He reports previous alcohol use. He reports previous drug use.  Family / Support Systems Marital Status: Married Patient Roles: Spouse, Parent, Other (Comment) (retired) Spouse/Significant Other: Joelene Millin [phone] Children: Pt has two son's one in [place] and one in [place] Other Supports: Friends Anticipated Caregiver: Joelene Millin Ability/Limitations of Caregiver: Still works and may need to take time off if pt requires 24 hr care Caregiver Availability: Other (Comment) (Will make arrangements if needed) Family Dynamics: Close with both of his son's sees when they can. His wife and he have two dogs that are their babies. He has friends and feels he has good supports  Social History Preferred language: English Religion:  Cultural Background: No issues Education: HS Read: Yes Write: Yes Employment Status: Retired Public relations account executive Issues: NO issues Guardian/Conservator: None-according to MD pt is capable of making his own decisions while here   Abuse/Neglect Abuse/Neglect Assessment Can Be Completed: Yes Physical Abuse: Denies Verbal Abuse: Denies Sexual Abuse: Denies Exploitation of patient/patient's resources: Denies Self-Neglect: Denies  Emotional Status Pt's affect, behavior and adjustment status: Pt is very independent and active and doesn't like asking for help. He prides himself on this and feels he will  get there if he has to do 10 hours a day of therapies. Recent Psychosocial Issues: healthy prior to admission-was not going to a MD. WIll need PCP upon discharge from rehab Psychiatric History: No history deferred depression screen due to coping appropriately. Would benefit from seeing neuro-psych while here Substance Abuse History: Quit tobacco since admission since aware bad for him and causes health issues. He does not want to have another CVA  Patient / Family Perceptions, Expectations & Goals Pt/Family understanding of illness & functional limitations: Pt and wife can explain his stroke and deficits. he is seeing progress which is encouraging him and gives him hope. Both he and wife talk with the MD and feel has a good understanding. Premorbid pt/family roles/activities: Husband, father, retiree, friend, etc Anticipated changes in roles/activities/participation: resume Pt/family expectations/goals: Pt states: " I want to be able to do what I did before this, I will get there."  Wife states: " He is stubborn and will push himself, he quit smoking."  [employer]: None Premorbid Home Care/DME Agencies: Other (Comment) (tub seat) Transportation available at discharge: Pt drove prior to admission and wife drives Resource referrals recommended: Neuropsychology  Discharge Planning Living Arrangements: Spouse/significant other Support Systems: Spouse/significant other, Friends/neighbors Type of Residence: Private residence Insurance Resources: Multimedia programmer (specify) ([employer] Team [employer]vantage) Financial Resources: Social Security, Family Support Financial Screen Referred: No Living Expenses: Rent Money Management: Patient, Spouse Does the patient have any problems obtaining your medications?: No Home Management: pt was doing home management prior to admission-cleaning, laundry, groceries Patient/Family Preliminary Plans: Return home with wife who does work and can  take some time off if needed. Pt hopes to not need 24 hr care at discharge and wants to get back  to his prior function Care Coordinator Barriers to Discharge: Other (comments) Care Coordinator Barriers to Discharge Comments: Needs PCP set up proir to DC home Care Coordinator Anticipated Follow Up Needs: HH/OP  Clinical Impression Pleasant quite motivated gentleman who is pushing himself to get to his prior functioning of mod/i level. His wife does work but can take off if needed. Pt may benefit from seeing neuro-psych while here for coping. Await team evaluations and work on obtaining PCP.   Elease Hashimoto 06/20/2020, 11:34 AM

## 2020-06-20 NOTE — Progress Notes (Signed)
Inpatient Rehabilitation  Patient information reviewed and entered into eRehab system by Akua Blethen M. Taneil Lazarus, M.A., CCC/SLP, PPS Coordinator.  Information including medical coding, functional ability and quality indicators will be reviewed and updated through discharge.    

## 2020-06-20 NOTE — Evaluation (Signed)
Physical Therapy Assessment and Plan  Patient Details  Name: Jacob Short MRN: [id number] Date of Birth: [birth year]  PT Diagnosis: Abnormality of gait, Difficulty walking, Hemiparesis dominant and Muscle weakness Rehab Potential: Good ELOS: 3weeks   Today's Date: 06/20/2020 PT Individual Time: 0900-1003 + 1300-1415 PT Individual Time Calculation (min): 63 min  + 59mn   Hospital Problem: Principal Problem:   Small vessel disease, cerebrovascular   Past Medical History:  Past Medical History:  Diagnosis Date  . Coronary artery disease   . Diabetes mellitus without complication ([place]   . Peripheral vascular disease ([facility]    Past Surgical History:  Past Surgical History:  Procedure Laterality Date  . CORONARY ANGIOPLASTY      Assessment & Plan Clinical Impression: Patient is a 74year old right-handed male with history of CAD and angioplasty maintained on Plavix, diabetes mellitus, tobacco abuse, AAA without rupture and hyperlipidemia.  Per chart review patient lives with spouse.  Independent prior to admission.  1 level home 3 steps to entry.  Presented 06/15/2020 with right side weakness.  Cranial CT scan negative for acute changes.  Chronic lacunar infarct of the right corona radiata.  Patient did not receive TPA.  CT angiogram of head and neck with no emergent findings.  MRI showed acute lacunar infarct of the left corona radiata.  Echocardiogram with ejection fraction of 50 to 554%grade 1 diastolic dysfunction.  Admission chemistries unremarkable aside glucose 157, urine drug screen negative, urinalysis negative nitrite.  Follow neurology services noted given failure of aspirin Plavix recommendations are for aspirin 81 mg daily and Brilinta 90 mg twice daily x30 days and transition back to aspirin and Plavix.  Tolerating regular diet.  Therapy evaluations completed and patient was admitted for a comprehensive rehab program. Patient currently requires mod with mobility secondary to  muscle weakness and impaired timing and sequencing, unbalanced muscle activation, motor apraxia, decreased coordination and decreased motor planning.  Prior to hospitalization, patient was independent  with mobility and lived with Spouse in a House home.  Home access is 4Stairs to enter.  Patient will benefit from skilled PT intervention to maximize safe functional mobility, minimize fall risk and decrease caregiver burden for planned discharge home with 24 hour assist.  Anticipate patient will benefit from follow up OP at discharge.  PT - End of Session Activity Tolerance: Tolerates 30+ min activity with multiple rests Endurance Deficit: Yes Endurance Deficit Description: Benefits from brief seated rest breaks PT Assessment Rehab Potential (ACUTE/IP ONLY): Good PT Barriers to Discharge: [place];Decreased caregiver support;Home environment access/layout;Insurance for SNF coverage;Lack of/limited family support PT Barriers to Discharge Comments: 4 STE, questionable 24/7 availability PT Patient demonstrates impairments in the following area(s): Balance;Endurance;Motor;Perception;Safety PT Transfers Functional Problem(s): Bed Mobility;Bed to Chair;Car PT Locomotion Functional Problem(s): Stairs;Ambulation PT Plan PT Intensity: Minimum of 1-2 x/day ,45 to 90 minutes PT Frequency: 5 out of 7 days PT Duration Estimated Length of Stay: 3weeks PT Treatment/Interventions: Ambulation/gait training;Discharge planning;Balance/vestibular training;Disease management/prevention;Neuromuscular re-education;Skin care/wound management;Therapeutic Exercise;Visual/perceptual remediation/compensation;Therapeutic Activities;Psychosocial support;Functional mobility training;UE/LE Strength taining/ROM;Splinting/orthotics;Pain management;DME/adaptive equipment instruction;Cognitive remediation/compensation;Community reintegration;Functional electrical stimulation;Patient/family education;Stair  training;UE/LE Coordination activities;Wheelchair propulsion/positioning PT Transfers Anticipated Outcome(s): Supervision with LRAD PT Locomotion Anticipated Outcome(s): Supervision with LRAD PT Recommendation Recommendations for Other Services: Therapeutic Recreation consult Therapeutic Recreation Interventions: Stress management Follow Up Recommendations: Outpatient PT;24 hour supervision/assistance Patient destination: Home Equipment Recommended: To be determined Equipment Details: Pt has no DME   PT Evaluation Precautions/Restrictions Precautions Precautions: Fall Precaution Comments: R hemiparesis UE>LE Restrictions Weight Bearing Restrictions:  No General Chart Reviewed: Yes Additional Pertinent History: AAA,  CAD,  PVD, and DMII. Tobacco abuse, HLD, EF 63-01% grade 1 diastolic dysfunction Family/Caregiver Present: No  Pain Pain Assessment Pain Scale: 0-10 Pain Score: 0-No pain Home Living/Prior Functioning Home Living Available Help at Discharge: Family;Available 24 hours/day Type of Home: House Home Access: Stairs to enter [employer] of Steps: 4 Entrance Stairs-Rails: Right Home Layout: One level  Lives With: Spouse Prior Function Level of Independence: Independent with gait;Other (comment) (fully indep with no DME needs)  Able to Take Stairs?: Yes Driving: Yes Vocation: Retired Art gallery manager: Impaired Comments: Often times leaving RUE behind while performing bed mobility or transfers. No overt R inattention Praxis Praxis: Impaired Praxis Impairment Details: Ideomotor;Motor planning  Cognition Overall Cognitive Status: Within Functional Limits for tasks assessed Arousal/Alertness: Awake/alert Orientation Level: Oriented X4 Attention: Sustained;Focused Focused Attention: Appears intact Sustained Attention: Appears intact Memory: Appears intact Problem Solving: Impaired Problem Solving Impairment: Functional  basic;Functional complex Safety/Judgment: Impaired Sensation Sensation Light Touch: Appears Intact Proprioception: Impaired by gross assessment Coordination Gross Motor Movements are Fluid and Coordinated: No Fine Motor Movements are Fluid and Coordinated: No Coordination and Movement Description: Effortful, decreased motor planning and muscle timing with R HB. Motor  Motor Motor: Motor apraxia;Abnormal postural alignment and control;Hemiplegia Motor - Skilled Clinical Observations: R lateral lean in sitting/standing, R HB weakness (UE > LE), BLE apraxia with gait   Trunk/Postural Assessment  Cervical Assessment Cervical Assessment: Exceptions to [facility] (forward head) Thoracic Assessment Thoracic Assessment: Exceptions to [facility] (mild kyphosis) Lumbar Assessment Lumbar Assessment: Exceptions to [facility] (sacral sitting + post pelvic tilt) Postural Control Postural Control: Deficits on evaluation (R lateral lean in both sitting/standing)  Balance Balance Balance Assessed: Yes Static Sitting Balance Static Sitting - Balance Support: Feet supported;No upper extremity supported Static Sitting - Level of Assistance: 3: Mod assist Dynamic Sitting Balance Dynamic Sitting - Balance Support: During functional activity Dynamic Sitting - Level of Assistance: 3: Mod assist Static Standing Balance Static Standing - Balance Support: No upper extremity supported Static Standing - Level of Assistance: 3: Mod assist Dynamic Standing Balance Dynamic Standing - Balance Support: During functional activity Dynamic Standing - Level of Assistance: 3: Mod assist Extremity Assessment  RUE Assessment RUE Assessment: Exceptions to [facility] General Strength Comments: no grasp, bicep 2/5, tricep 3/5, shldr flex 2-/5 LUE Assessment LUE Assessment: Within Functional Limits General Strength Comments: Grossly 5/5 RLE Assessment RLE Assessment: Exceptions to [facility] General Strength Comments: ankle DF 4/5, knee ext 4+/5,  hip flex 4-/5 LLE Assessment LLE Assessment: Within Functional Limits General Strength Comments: Grossly 5/5  Care Tool Care Tool Bed Mobility Roll left and right activity   Roll left and right assist level: Moderate Assistance - Patient 50 - 74%    Sit to lying activity   Sit to lying assist level: Moderate Assistance - Patient 50 - 74%    Lying to sitting edge of bed activity   Lying to sitting edge of bed assist level: Moderate Assistance - Patient 50 - 74%     Care Tool Transfers Sit to stand transfer   Sit to stand assist level: Moderate Assistance - Patient 50 - 74%    Chair/bed transfer   Chair/bed transfer assist level: Moderate Assistance - Patient 50 - 74%     Physiological scientist transfer assist level: Moderate Assistance - Patient 50 - 74%      Care Tool  Locomotion Ambulation   Assist level: Moderate Assistance - Patient 50 - 74% Assistive device: No Device Max distance: 43f  Walk 10 feet activity   Assist level: Moderate Assistance - Patient - 50 - 74% Assistive device: No Device   Walk 50 feet with 2 turns activity   Assist level: Moderate Assistance - Patient - 50 - 74% Assistive device: No Device  Walk 150 feet activity Walk 150 feet activity did not occur: Safety/medical concerns (fatigue after 551f      Walk 10 feet on uneven surfaces activity Walk 10 feet on uneven surfaces activity did not occur: Safety/medical concerns      Stairs   Assist level: Moderate Assistance - Patient - 50 - 74% Stairs assistive device: 1 hand rail    Walk up/down 1 step activity   Walk up/down 1 step (curb) assist level: Moderate Assistance - Patient - 50 - 74% Walk up/down 1 step or curb assistive device: 1 hand rail    Walk up/down 4 steps activity Walk up/down 4 steps assist level: Moderate Assistance - Patient - 50 - 74%    Walk up/down 12 steps activity Walk up/down 12 steps activity did not occur: Safety/medical concerns      Pick  up small objects from floor Pick up small object from the floor (from standing position) activity did not occur: Safety/medical concerns      Wheelchair Will patient use wheelchair at discharge?: No          Wheel 50 feet with 2 turns activity      Wheel 150 feet activity        Refer to Care Plan for Long Term Goals  SHORT TERM GOAL WEEK 1 PT Short Term Goal 1 (Week 1): Pt will perform bed mobility with minA PT Short Term Goal 2 (Week 1): Pt will perform bed<>chair transfer with minA and LRAD PT Short Term Goal 3 (Week 1): Pt will ambulate at least 5037fith minA and LRAD PT Short Term Goal 4 (Week 1): Pt will navigate up/down at least 4 steps with minA and LRAD  Recommendations for other services: Therapeutic Recreation  Stress management  Skilled Therapeutic Intervention Mobility Bed Mobility Bed Mobility: Rolling Right;Rolling Left;Supine to Sit;Sit to Supine Rolling Right: Minimal Assistance - Patient > 75% Rolling Left: Moderate Assistance - Patient 50-74% Supine to Sit: Moderate Assistance - Patient 50-74% Sit to Supine: Moderate Assistance - Patient 50-74% Transfers Transfers: Sit to Stand;Stand to Sit;Stand Pivot Transfers Sit to Stand: Moderate Assistance - Patient 50-74% Stand to Sit: Moderate Assistance - Patient 50-74% Stand Pivot Transfers: Moderate Assistance - Patient 50 - 74% Stand Pivot Transfer Details: Tactile cues for sequencing;Tactile cues for weight shifting;Verbal cues for precautions/safety;Verbal cues for technique;Verbal cues for sequencing;Visual cues/gestures for precautions/safety;Visual cues/gestures for sequencing;Verbal cues for gait pattern Transfer (Assistive device): None Locomotion  Gait Ambulation: Yes Gait Assistance: Moderate Assistance - Patient 50-74% Gait Distance (Feet): 50 Feet Assistive device: None Gait Assistance Details: Tactile cues for placement;Visual cues/gestures for sequencing;Verbal cues for gait pattern;Manual  facilitation for weight bearing;Verbal cues for safe use of DME/AE;Verbal cues for sequencing;Verbal cues for technique;Verbal cues for precautions/safety;Visual cues/gestures for precautions/safety;Tactile cues for posture;Tactile cues for weight shifting Gait Gait: Yes Gait Pattern: Impaired Gait Pattern: Step-to pattern;Decreased step length - right;Decreased step length - left;Decreased hip/knee flexion - left;Decreased hip/knee flexion - right;Decreased stance time - right;Narrow base of support;Trunk flexed;Right foot flat Gait velocity: decreased High Level Ambulation High Level Ambulation: Backwards walking Backwards Walking:  with min/modA and L HR support. 87f Stairs / Additional Locomotion Stairs: Yes Stairs Assistance: Moderate Assistance - Patient 50 - 74% Stair Management Technique: One rail Left Number of Stairs: 4 Height of Stairs: 6 Wheelchair Mobility Wheelchair Mobility: No   Skilled Intervention:  1st session: Pt received supine in bed, awake and agreeable to PT session. Denies pain. PLOF and hx taken as described above. Pt with mild saturated brief on arrival, able to doff via bridging technique and don new with one with similar effort. Scrub pants donned with maxA while supine. Rolling R with minA with HOB flat, requiring modA for rolling L. Supine<>sit with modA towards L EOB, initially requiring modA for sitting balance, progressing to minA. Pt continent of voiding in urinal while seated EOB. Donned scrub shirt with modA via threading technique. Sit<>stand with modA from raised EOB height (pt is 6'4) and no AD. Stand<>pivot with modA towards weaker R side with cues for technique and stepping. WC transport [place]. Gait training of ~558fwith modA and no AD, demo's narrow BOS, decreased R stance time, R lateral lean, decreased R foot clearance. WC transport back to his room where he remained seated with belt alarm on, RUE supported by  pillows, needs in reach.  2nd session: Pt received sitting in w/c, agreeable to PT session, denies pain. Replaced his w/c with more appropriate fitting 18x20 w/c (pt is 6'4, would benefit from 18x22 but none present in closet) and updated cushion. WC transport to hallway outside main therapy gym to practice gait training. He ambulated [id number] and [id number] with minA and LHR support + 60ft107fwards and 60ft 82fwards with minA and LHR support. With gait, demo's decreased R step length, narrow BOS, and R lateral trunk lean. Performed stair training and navigated up/down 2x4 steps with min/modA and L HR support, cues during 2nd trial for correct sequencing and step-to pattern with L foot leading ascent and R foot leading descent. No knee buckling noted but posterior bias with stairs and difficulty with R foot placement while descending. Performed car transfer with modA and no AD, increased difficulty getting out of car as well as managing BLE's into car to clear threshold. Performed additional gait training 45ft w60fmodA and no HR support in hallway to ADL room, stand>sit with minA for controlled lowering to sofa couch. Required modA for sit<>stand from sofa and ambulated an additional 45ft wi57f heavy modA (fatigue) back to his w/c. WC transport remaining distance to his room where he remained seated in w/c with 1/2 lap tray supporting RUE, belt alarm on, needs in reach.  Instructed pt in results of PT evaluation as detailed above, PT POC, rehab potential, rehab goals, and discharge recommendations. Additionally discussed [facility]'s policies regarding fall safety and use of chair alarm and/or quick release belt. Pt verbalized understanding and in agreement. Will update pt's family members as they become available.   Discharge Criteria: Patient will be discharged from PT if patient refuses treatment 3 consecutive times without medical reason, if treatment goals not met, if there is a change in medical  status, if patient makes no progress towards goals or if patient is discharged from hospital.  The above assessment, treatment plan, treatment alternatives and goals were discussed and mutually agreed upon: by patient  [place]ia[place]/2021, 10:43 AM

## 2020-06-20 NOTE — Progress Notes (Signed)
Jacob Short, PT  Rehab Admission Coordinator  Physical Medicine and Rehabilitation  PMR Pre-admission     Signed  Date of Service:  06/19/2020 10:19 AM      Related encounter: ED to Hosp-Admission (Discharged) from 06/15/2020 in [facility]      Signed       Show:Clear all [x]Manual[x]Template[x]Copied  Added by: [x]Jacob Short, Jacob Short, PT  []Hover for details PMR Admission Coordinator Pre-Admission Assessment  Patient: Jacob Short is an 74 y.o., male MRN: [id number] DOB: 02-13-1946 Height: 6' 4" (193 cm) Weight: 84.5 kg                                                                                                                                                  Insurance Information HMO:     PPO: yes     PCP:      IPA:      80/20:      OTHER:  PRIMARY: [employer]      Policy#: [id number]      Subscriber: pt CM Name: Jacob Short      Phone#: [phone]     Fax#: EPIC  Pre-Cert#: [id number] auth for CIR provided by Jacob Short with [facility] for admit 9/27 through 10/3.  [facility] has EPIC access.       Employer:  Benefits:  Phone #: [phone]     Name:  Eff. Date: 09/23/14     Deduct: $0      Out of Pocket Max: $3400 ($45 met)      Life Max: n/a  CIR: $295/day for days 1-6      SNF: 20 full days Outpatient:      Co-Pay: $15/visit Home Health: 100%      Co-Pay:  DME: 80%     Co-Pay: 20% Providers: preferred network  SECONDARY:       Policy#:       Phone#:   Development worker, community:       Phone#:   [employer] Engineer, [employer]" for patients in Inpatient Rehabilitation Facilities with attached "Privacy Act [place] Records" was provided and verbally reviewed with: Patient and Family  Emergency Contact Information         Contact Information    Name Relation Home Work Mobile   Jacob Short Significant other   [phone]     Current Medical History  Patient Admitting Diagnosis: L corona radiata  infarct  History of Present Illness: [name] a 74 year old right-handed male with history of CAD and angioplasty maintained on Plavix, diabetes mellitus, tobacco abuse, AAA without rupture and hyperlipidemia. Presented 06/15/2020 with right side weakness. Cranial CT scan negative for acute changes. Chronic lacunar infarct of the right corona radiata. Patient did not receive TPA. CT angiogram of head and neck with no emergent findings. MRI showed acute lacunar infarct of the left  corona radiata. Echocardiogram with ejection fraction of 50 to 09% grade 1 diastolic dysfunction. Admission chemistries unremarkable aside glucose 157, urine drug screen negative, urinalysis negative nitrite. Follow neurology services noted given failure of aspirin Plavix recommendations are for aspirin 81 mg daily and Brilinta 90 mg twice daily x30 days and transition back to aspirin and Plavix. Tolerating regular diet. Therapy evaluations completed and patient was recommended for a comprehensive rehab program.  Complete NIHSS TOTAL: 5  Past Medical History      Past Medical History:  Diagnosis Date  . Coronary artery disease   . Diabetes mellitus without complication ([place])   . Peripheral vascular disease ([place])     Family History  family history is not on file.  Prior Rehab/Hospitalizations:  Has the patient had prior rehab or hospitalizations prior to admission? No  Has the patient had major surgery during 100 days prior to admission? No  Current Medications   Current Facility-Administered Medications:  .  acetaminophen (TYLENOL) tablet 650 mg, 650 mg, Oral, Q6H PRN **OR** acetaminophen (TYLENOL) suppository 650 mg, 650 mg, Rectal, Q6H PRN, Jacob Short, Jacob M, MD .  aspirin EC tablet 81 mg, 81 mg, Oral, Daily, Jacob Axon, MD, 81 mg at 06/19/20 0906 .  atorvastatin (LIPITOR) tablet 80 mg, 80 mg, Oral, Daily, Jacob Noble, MD, 80 mg at 06/19/20 0906 .  enoxaparin  (LOVENOX) injection 40 mg, 40 mg, Subcutaneous, Q24H, Jacob Short, Jacob M, MD, 40 mg at 06/18/20 1500 .  losartan (COZAAR) tablet 25 mg, 25 mg, Oral, Daily, Jacob Beard, MD, 25 mg at 06/19/20 0906 .  metFORMIN (GLUCOPHAGE) tablet 1,000 mg, 1,000 mg, Oral, Q breakfast, Jacob Beard, MD, 1,000 mg at 06/19/20 0906 .  metFORMIN (GLUCOPHAGE) tablet 1,500 mg, 1,500 mg, Oral, Q supper, Jacob Beard, MD, 1,500 mg at 06/18/20 1803 .  nicotine (NICODERM CQ - dosed in mg/24 hours) patch 21 mg, 21 mg, Transdermal, Daily, Jacob Short, Jacob M, MD .  ticagrelor [facility]) tablet 90 mg, 90 mg, Oral, BID, Jacob Hawking, MD, 90 mg at 06/19/20 7353  Patients Current Diet:     Diet Order                  Diet - low sodium heart healthy            Diet Heart Room service appropriate? Yes; Fluid consistency: Thin  Diet effective now                  Precautions / Restrictions Precautions Precautions: Fall Precaution Comments: R hemiparesis UE>LE Restrictions Weight Bearing Restrictions: No   Has the patient had 2 or more falls or a fall with injury in the past year?No  Prior Activity Level Community (5-7x/wk): very active prior to admission, driving, no DME  Prior Functional Level Prior Function Level of Independence: Independent Comments: Pt fully independent PTA; pt and wife enjoy going on beach vacations, going to the pool  Self Care: Did the patient need help bathing, dressing, using the toilet or eating?  Independent  Indoor Mobility: Did the patient need assistance with walking from room to room (with or without device)? Independent  Stairs: Did the patient need assistance with internal or external stairs (with or without device)? Independent  Functional Cognition: Did the patient need help planning regular tasks such as shopping or remembering to take medications? Independent  Home Assistive Devices / Equipment Home Assistive Devices/Equipment: None Home  Equipment: Shower seat  Prior Device Use: Indicate devices/aids used by the patient  prior to current illness, exacerbation or injury? None of the above  Current Functional Level Cognition  Overall Cognitive Status: Within Functional Limits for tasks assessed Orientation Level: Oriented X4 General Comments: Pt very motivated and engaged, dry sense of humor and quick-witted.    Extremity Assessment (includes Sensation/Coordination)  Upper Extremity Assessment: Defer to OT evaluation RUE Deficits / Details: Shoulder 1/5, biceps 1/5, triceps 2/5, wrist/hand 0/5 RUE Sensation: WNL RUE Coordination: decreased fine motor, decreased gross motor  Lower Extremity Assessment: RLE deficits/detail RLE Deficits / Details: 3/5 knee extension, 3/5 hip flexion assessed via SLR with <10* quad lag RLE Coordination: decreased fine motor, decreased gross motor    ADLs  Overall ADL's : Needs assistance/impaired Eating/Feeding: Minimal assistance, Sitting, Cueing for compensatory techinques Eating/Feeding Details (indicate cue type and reason): must use LUE to feed self and is R dominant. Grooming: Wash/dry face, Wash/dry hands, Oral care, Minimal assistance, Sitting, Cueing for compensatory techniques Grooming Details (indicate cue type and reason): pt does well but needs cues to use LUE for now. Upper Body Bathing: Sitting, Moderate assistance, Cueing for compensatory techniques Upper Body Bathing Details (indicate cue type and reason): min assist to get to all areas with  no functional use of RUE and min guard for balance. Lower Body Bathing: Sit to/from stand, Moderate assistance, Cueing for compensatory techniques Lower Body Bathing Details (indicate cue type and reason): sit to stand with min assist but mod assist needed to maintain balance in standing. Upper Body Dressing : Moderate assistance, Sitting, Cueing for compensatory techniques Lower Body Dressing: Maximal assistance, Sit to/from  stand, Cueing for compensatory techniques Lower Body Dressing Details (indicate cue type and reason): balance in sitting and standing limited when challengd. Toilet Transfer: Moderate assistance, Stand-pivot, BSC, RW Toilet Transfer Details (indicate cue type and reason): used walker with therapist assisting with [place] and Hygiene: Moderate assistance, Sit to/from stand, Cueing for compensatory techniques Functional mobility during ADLs: Moderate assistance, Rolling walker General ADL Comments: Pt very limited due to no functional use of RUE and LLE is weaker than LLE affecting balance and safety.    Mobility  Overal bed mobility: Needs Assistance Bed Mobility: Supine to Sit Supine to sit: Min assist Sit to supine: Min assist General bed mobility comments: min A to progress LEs and elevate trunk. Once up unable to scoot forward. Mod A to progress hips to EOB    Transfers  Overall transfer level: Needs assistance Equipment used: 1 person hand held assist Transfers: Sit to/from Stand, Stand Pivot Transfers Sit to Stand: Min assist Stand pivot transfers: Mod assist General transfer comment: Pt able to stand from with min A. SPT to recliner via face to face required additional assist as R LE tends to buckle. Pt prematurally sitting into recliner chair.     Ambulation / Gait / Stairs / Wheelchair Mobility  Ambulation/Gait Ambulation/Gait assistance: +2 safety/equipment, Mod assist Gait Distance (Feet): 15 Feet (x3) Assistive device: 1 person hand held assist (+ LUE using chair rail in hallway as external support) Gait Pattern/deviations: Step-to pattern, Step-through pattern, Decreased stride length, Trunk flexed, Narrow base of support, Decreased stance time - right General Gait Details: Light mod A to ambulate in hallway. Pt requiring blocking at R knee during stance phase and assist for balance. VC to widen BOS. Gait velocity: decr    Posture /  Balance Dynamic Sitting Balance Sitting balance - Comments: posterior leaning with LE movement Balance Overall balance assessment: Needs assistance Sitting-balance support: Feet supported,  Single extremity supported Sitting balance-Leahy Scale: Fair Sitting balance - Comments: posterior leaning with LE movement Postural control: Posterior lean, Left lateral lean Standing balance support: During functional activity, Single extremity supported Standing balance-Leahy Scale: Poor Standing balance comment: reliant on external support    Special needs/care consideration Diabetic management yes and Designated visitor [place] (from acute therapy documentation) Living Arrangements: Spouse/significant other Available Help at Discharge: Family, Available 24 hours/day Type of Home: House Home Layout: One level Home Access: Stairs to enter Entrance Stairs-Rails: Right, Left Entrance Stairs-Number of Steps: 3 Bathroom Shower/Tub: Gaffer, Charity fundraiser: [place]: No  Discharge Living Setting Plans for Discharge Living Setting: Patient's home Type of Home at Discharge: House Discharge Home Layout: One level Discharge Home Access: Stairs to enter Entrance Stairs-Rails: Right, Left Entrance Stairs-Number of Steps: 3 Discharge Bathroom Shower/Tub: Walk-in shower Discharge Bathroom Toilet: Standard Discharge Bathroom Accessibility: Yes How Accessible: Accessible via walker Does the patient have any problems obtaining your medications?: No  Social/Family/Support Systems Patient Roles: Spouse Anticipated Caregiver: wife, Joelene Millin Anticipated Ambulance person Information: [phone] Ability/Limitations of Caregiver: n/a Caregiver Availability: 24/7 Discharge Plan Discussed with Primary Caregiver: Yes Is Caregiver In Agreement with Plan?: Yes Does Caregiver/Family have Issues with Lodging/Transportation while Pt is  in Rehab?: No   Goals Patient/Family Goal for Rehab: PT/OT mod I, SLP n/a Expected length of stay: 16-18 days Pt/Family Agrees to Admission and willing to participate: Yes Program Orientation Provided & Reviewed with Pt/Caregiver Including Roles  & Responsibilities: Yes  Barriers to Discharge: Insurance for SNF coverage   Decrease burden of Care through IP rehab admission: n/a  Possible need for SNF placement upon discharge: Not anticipated  Patient Condition: This patient's medical and functional status has changed since the consult dated: 06/16/20 in which the Rehabilitation Physician determined and documented that the patient's condition is appropriate for intensive rehabilitative care in an inpatient rehabilitation facility. See "History of Present Illness" (above) for medical update. Functional changes are: mod assist +2 x15'. Patient's medical and functional status update has been discussed with the Rehabilitation physician and patient remains appropriate for inpatient rehabilitation. Will admit to inpatient rehab today.  Preadmission Screen Completed By:  Jacob Short, PT, 06/19/2020 10:19 AM ______________________________________________________________________   Discussed status with Dr. Ranell Patrick on 06/19/20 at 10:39 AM  and received approval for admission today.  Admission Coordinator:  Jacob Short, PT, DPT time 10:39 AM Sudie Grumbling 06/19/20            Cosigned by: Izora Ribas, MD at 06/19/2020 10:47 AM  Revision History                Note Details  Author Jacob Short, PT File Time 06/19/2020 10:41 AM  Author Type Rehab Admission Coordinator Status Signed  Last Editor Jacob Short, PT Service Physical Medicine and [place] # [id number] Admit Date 06/19/2020

## 2020-06-20 NOTE — Progress Notes (Signed)
[place] PHYSICAL MEDICINE & REHABILITATION PROGRESS NOTE   Subjective/Complaints: Patient seen laying in bed this morning.  States he slept well overnight.  States he is ready to begin therapies.  Discussed with nursing, plan to DC nicotine patch as patient states that he is no longer smoking.  ROS: Denies CP, SOB, N/V/D  Objective:   No results found. Recent Labs    06/18/20 0431 06/20/20 0454  WBC 9.2 13.0*  HGB 14.7 14.2  HCT 43.7 42.7  PLT 228 217   Recent Labs    06/18/20 0431 06/20/20 0454  NA 136 134*  K 3.8 3.8  CL 103 102  CO2 24 23  GLUCOSE 179* 146*  BUN 9 9  CREATININE 0.70 0.56*  CALCIUM 8.7* 8.9    Intake/Output Summary (Last 24 hours) at 06/20/2020 1306 Last data filed at 06/20/2020 0818 Gross per 24 hour  Intake 537 ml  Output 550 ml  Net -13 ml        Physical Exam: Vital Signs Blood pressure (!) 159/80, pulse 79, temperature 98.4 F (36.9 C), resp. rate 19, height 6\' 4"  (1.93 m), weight 84.9 kg, SpO2 97 %. Constitutional: No distress . Vital signs reviewed. HENT: Normocephalic.  Atraumatic. Eyes: EOMI. No discharge. Cardiovascular: No JVD.  RRR. Respiratory: Normal effort.  No stridor.  Bilateral clear to auscultation. GI: Non-distended.  BS +. Skin: Warm and dry.  Intact. Psych: Normal mood.  Normal behavior. Musc: No edema in extremities.  No tenderness in extremities. Neuro: Alert Mild dysarthria Fair awareness of deficits.  Right facial droop Motor: RUE: RUE 0/5 proximal distal Left upper extremity, bilateral lower extremities: 5/5 proximal to distal   Assessment/Plan: 1. Functional deficits secondary to left corona radiata infarct which require 3+ hours per day of interdisciplinary therapy in a comprehensive inpatient rehab setting.  Physiatrist is providing close team supervision and 24 hour management of active medical problems listed below.  Physiatrist and rehab team continue to assess barriers to discharge/monitor  patient progress toward functional and medical goals  Care Tool:  Bathing              Bathing assist Assist Level: Moderate Assistance - Patient 50 - 74%     Upper Body Dressing/Undressing Upper body dressing   What is the patient wearing?: Pull over shirt    Upper body assist Assist Level: Moderate Assistance - Patient 50 - 74%    Lower Body Dressing/Undressing Lower body dressing      What is the patient wearing?: Pants, Incontinence brief     Lower body assist Assist for lower body dressing: Maximal Assistance - Patient 25 - 49%     Toileting Toileting    Toileting assist       Transfers Chair/bed transfer  Transfers assist     Chair/bed transfer assist level: Moderate Assistance - Patient 50 - 74%     Locomotion Ambulation   Ambulation assist      Assist level: Moderate Assistance - Patient 50 - 74% Assistive device: No Device Max distance: 73ft   Walk 10 feet activity   Assist     Assist level: Moderate Assistance - Patient - 50 - 74% Assistive device: No Device   Walk 50 feet activity   Assist    Assist level: Moderate Assistance - Patient - 50 - 74% Assistive device: No Device    Walk 150 feet activity   Assist Walk 150 feet activity did not occur: Safety/medical concerns (fatigue after 58ft)  Walk 10 feet on uneven surface  activity   Assist Walk 10 feet on uneven surfaces activity did not occur: Safety/medical concerns         Wheelchair     Assist Will patient use wheelchair at discharge?: No             Wheelchair 50 feet with 2 turns activity    Assist            Wheelchair 150 feet activity     Assist          Medical Problem List and Plan: 1. Right side weakness secondary to left corona radiata infarct secondary small vessel disease  Begin CIR evaluations   WHO ordered 2.  Antithrombotics: -DVT/anticoagulation: Lovenox.             -antiplatelet therapy: Current  plan is for aspirin 81 mg daily and Brilinta 90 mg twice daily x30 days then transition back to aspirin and Plavix 3. Pain Management: Denies pain.  4. Mood: Provide emotional support             -antipsychotic agents: N/A 5. Neuropsych: This patient is capable of making decisions on his own behalf. 6. Skin/Wound Care: Routine skin checks 7. Fluids/Electrolytes/Nutrition: Routine in and outs. 8.  CAD with angioplasty.  Continue aspirin. 9.  Diabetes mellitus with peripheral neuropathy.  Hemoglobin A1c 6.9.  Glucophage 1000 mg at breakfast 1500 mg with supper.    Monitor with increased mobility 10.  Hyperlipidemia.  Lipitor 11.  Tobacco abuse: Patient states that he has quit.  Will DC nicotine patch. 12.  Hypertension.  Cozaar 25 mg daily.    Monitor with increased mobility. 13.  History of AAA without rupture.  Follow-up outpatient monitoring.  14.  Hyponatremia  Sodium 134 on 9/20  Continue to monitor 15.  Hypoalbuminemia  Supplement initiated on 9/28 16.  Leukocytosis  WBCs 13.0 on 9/28, labs ordered for tomorrow  Afebrile  LOS: 1 days A FACE TO FACE EVALUATION WAS PERFORMED  Jacqualine Weichel Lorie Phenix 06/20/2020, 1:06 PM

## 2020-06-20 NOTE — Progress Notes (Signed)
Jacob Ribas, MD  Physician  Physical Medicine and Rehabilitation  Consult Note     Signed  Date of Service:  06/16/2020 10:02 AM      Related encounter: ED to Hosp-Admission (Discharged) from 06/15/2020 in [facility]      Signed      Expand All Collapse All  Show:Clear all [x] Manual[x] Template[] Copied  Added by: [x] Angiulli, Lavon Paganini, PA-C[x] Raulkar, Clide Deutscher, MD  [] Hover for details      Physical Medicine and Rehabilitation Consult Reason for Consult: Right side weakness Referring Physician: Internal medicine   HPI: Jacob Short is a 74 y.o. right-handed male CAD with angioplasty maintained on Plavix, diabetes mellitus, tobacco abuse, AAA without rupture, and hyperlipidemia.  Per chart review patient lives with spouse.  Independent prior to admission.  1 level home 3 steps to entry.  Presented 06/15/2020 with right side weakness cranial CT scan negative for acute changes.  Chronic lacunar infarct of the right corona radiata.  Patient did not receive TPA.  CT angiogram of head and neck with no emergent findings.  MRI showed acute lacunar infarct of the left corona radiata.  Echocardiogram pending.  Admission chemistries unremarkable except glucose 157, urine drug screen negative, urinalysis negative nitrite.  Currently maintained on aspirin and Plavix for CVA prophylaxis.  Subcutaneous Lovenox for DVT prophylaxis.  Tolerating a regular diet.  Therapy evaluations completed with recommendations of physical medicine rehab consult.   Review of Systems  Constitutional: Negative for chills, fever and malaise/fatigue.  HENT: Negative for hearing loss.   Eyes: Negative for blurred vision and double vision.  Respiratory: Negative for cough and shortness of breath.   Cardiovascular: Negative for chest pain, palpitations and leg swelling.  Gastrointestinal: Positive for constipation. Negative for heartburn, nausea and vomiting.  Genitourinary:  Negative for dysuria and hematuria.  Musculoskeletal: Positive for myalgias.  Skin: Negative for rash.  Neurological: Positive for weakness.  All other systems reviewed and are negative.      Past Medical History:  Diagnosis Date  . Coronary artery disease   . Diabetes mellitus without complication ([place])   . Peripheral vascular disease [facility])         Past Surgical History:  Procedure Laterality Date  . CORONARY ANGIOPLASTY     History reviewed. No pertinent family history. Social History:  reports that he quit smoking yesterday. He uses smokeless tobacco. He reports previous alcohol use. He reports previous drug use. Allergies: No Known Allergies       Medications Prior to Admission  Medication Sig Dispense Refill  . aspirin 325 MG tablet Take 325 mg by mouth daily.    . clopidogrel (PLAVIX) 75 MG tablet Take 75 mg by mouth daily.    . Ibuprofen-diphenhydrAMINE HCl 200-25 MG CAPS Take 1-2 tablets by mouth at bedtime as needed (pain and sleep).    . losartan (COZAAR) 25 MG tablet Take 25 mg by mouth daily.    . metFORMIN (GLUCOPHAGE) 500 MG tablet Take 1,000-1,500 mg by mouth See admin instructions. Take 1000mg  in the morning and 1500mg  in the evening.    . naproxen sodium (ALEVE) 220 MG tablet Take 220 mg by mouth 2 (two) times daily as needed (pain).    . rosuvastatin (CRESTOR) 10 MG tablet Take 10 mg by mouth at bedtime.      Home: Home Living Family/patient expects to be discharged to:: Private residence Living Arrangements: Spouse/significant other Available Help at Discharge: Family, Available 24 hours/day Type of Home: [place]  Access: Stairs to enter [employer] of Steps: 3 Entrance Stairs-Rails: Right, Left Home Layout: One level Bathroom Shower/Tub: Gaffer, Charity fundraiser: Standard Home Equipment: Careers adviser History: Prior Function Level of Independence: Independent Comments: Pt fully independent  PTA Functional Status:  Mobility: Bed Mobility Overal bed mobility: Needs Assistance Bed Mobility: Supine to Sit, Sit to Supine Supine to sit: Mod assist Sit to supine: Min assist General bed mobility comments: head of bed was elevated.   Transfers Overall transfer level: Needs assistance Equipment used: Rolling walker (2 wheeled), 1 person hand held assist Transfers: Sit to/from Stand, Stand Pivot Transfers Sit to Stand: Mod assist Stand pivot transfers: Mod assist General transfer comment: Pt R leg buckles.  ADL: ADL Overall ADL's : Needs assistance/impaired Eating/Feeding: Minimal assistance, Sitting, Cueing for compensatory techinques Eating/Feeding Details (indicate cue type and reason): must use LUE to feed self and is R dominant. Grooming: Wash/dry face, Wash/dry hands, Oral care, Minimal assistance, Sitting, Cueing for compensatory techniques Grooming Details (indicate cue type and reason): pt does well but needs cues to use LUE for now. Upper Body Bathing: Sitting, Moderate assistance, Cueing for compensatory techniques Upper Body Bathing Details (indicate cue type and reason): min assist to get to all areas with  no functional use of RUE and min guard for balance. Lower Body Bathing: Sit to/from stand, Moderate assistance, Cueing for compensatory techniques Lower Body Bathing Details (indicate cue type and reason): sit to stand with min assist but mod assist needed to maintain balance in standing. Upper Body Dressing : Moderate assistance, Sitting, Cueing for compensatory techniques Lower Body Dressing: Maximal assistance, Sit to/from stand, Cueing for compensatory techniques Lower Body Dressing Details (indicate cue type and reason): balance in sitting and standing limited when challengd. Toilet Transfer: Moderate assistance, Stand-pivot, BSC, RW Toilet Transfer Details (indicate cue type and reason): used walker with therapist assisting with [place] and Hygiene: Moderate assistance, Sit to/from stand, Cueing for compensatory techniques Functional mobility during ADLs: Moderate assistance, Rolling walker General ADL Comments: Pt very limited due to no functional use of RUE and LLE is weaker than LLE affecting balance and safety.  Cognition: Cognition Overall Cognitive Status: Within Functional Limits for tasks assessed Orientation Level: Oriented X4 Cognition Arousal/Alertness: Awake/alert Behavior During Therapy: WFL for tasks assessed/performed Overall Cognitive Status: Within Functional Limits for tasks assessed General Comments: Pt very motivated and engaged.  Blood pressure (!) 176/88, pulse 74, temperature 98.7 F (37.1 C), temperature source Oral, resp. rate 14, height 6\' 4"  (1.93 m), weight 82.1 kg, SpO2 97 %. Physical Exam General: Alert and oriented x 3, No apparent distress HEENT: Head is normocephalic, atraumatic, PERRLA, EOMI, sclera anicteric, oral mucosa pink and moist, dentition intact, ext ear canals clear,  Neck: Supple without JVD or lymphadenopathy Heart: Reg rate and rhythm. No murmurs rubs or gallops Chest: CTA bilaterally without wheezes, rales, or rhonchi; no distress Abdomen: Soft, non-tender, non-distended, bowel sounds positive. Extremities: No clubbing, cyanosis, or edema. Pulses are 2+ Skin: Clean and intact without signs of breakdown Neuro: Patient is alert in no acute distress.  Oriented x3.  Mild dysarthria.  R facial droop. RUE w/ 1/5 SA, EF, 2/5 EE, 0/5 WE and hand grip. RLE 4+/5 throughout, L sided strength intact Psych: Pt's affect is appropriate. Pt is cooperative    Lab Results Last 24 Hours       Results for orders placed or performed during the hospital encounter of 06/15/20 (from the past 24  hour(s))  Lipid panel     Status: Abnormal   Collection Time: 06/15/20  6:22 PM  Result Value Ref Range   Cholesterol 104 0 - 200 mg/dL   Triglycerides 147 <150 mg/dL   HDL 37  (L) >40 mg/dL   Total CHOL/HDL Ratio 2.8 RATIO   VLDL 29 0 - 40 mg/dL   LDL Cholesterol 38 0 - 99 mg/dL  Hemoglobin A1c     Status: Abnormal   Collection Time: 06/15/20  6:22 PM  Result Value Ref Range   Hgb A1c MFr Bld 6.9 (H) 4.8 - 5.6 %   Mean Plasma Glucose 151.33 mg/dL  Basic metabolic panel     Status: Abnormal   Collection Time: 06/16/20  2:36 AM  Result Value Ref Range   Sodium 140 135 - 145 mmol/L   Potassium 3.7 3.5 - 5.1 mmol/L   Chloride 103 98 - 111 mmol/L   CO2 24 22 - 32 mmol/L   Glucose, Bld 128 (H) 70 - 99 mg/dL   BUN 9 8 - 23 mg/dL   Creatinine, Ser 0.57 (L) 0.61 - 1.24 mg/dL   Calcium 9.0 8.9 - 10.3 mg/dL   GFR calc non Af Amer >60 >60 mL/min   GFR calc Af Amer >60 >60 mL/min   Anion gap 13 5 - 15  CBC     Status: Abnormal   Collection Time: 06/16/20  2:36 AM  Result Value Ref Range   WBC 10.9 (H) 4.0 - 10.5 K/uL   RBC 4.71 4.22 - 5.81 MIL/uL   Hemoglobin 15.0 13.0 - 17.0 g/dL   HCT 45.4 39 - 52 %   MCV 96.4 80.0 - 100.0 fL   MCH 31.8 26.0 - 34.0 pg   MCHC 33.0 30.0 - 36.0 g/dL   RDW 13.9 11.5 - 15.5 %   Platelets 237 150 - 400 K/uL   nRBC 0.0 0.0 - 0.2 %  Glucose, capillary     Status: Abnormal   Collection Time: 06/16/20  6:28 AM  Result Value Ref Range   Glucose-Capillary 143 (H) 70 - 99 mg/dL   Comment 1 Notify RN    Comment 2 Document in Chart       Imaging Results (Last 48 hours)  CT Code Stroke CTA Head W/WO contrast  Result Date: 06/15/2020 CLINICAL DATA:  Right-sided weakness. EXAM: CT ANGIOGRAPHY HEAD AND NECK CT PERFUSION BRAIN TECHNIQUE: Multidetector CT imaging of the head and neck was performed using the standard protocol during bolus administration of intravenous contrast. Multiplanar CT image reconstructions and MIPs were obtained to evaluate the vascular anatomy. Carotid stenosis measurements (when applicable) are obtained utilizing NASCET criteria, using the distal internal carotid diameter as  the denominator. Multiphase CT imaging of the brain was performed following IV bolus contrast injection. Subsequent parametric perfusion maps were calculated using RAPID software. CONTRAST:  Dose is currently unknown. COMPARISON:  Head CT from earlier the same day FINDINGS: CTA NECK FINDINGS Aortic arch: Atheromatous plaque.  Three vessel branching. Right carotid system: Mixed density mild for age plaque at the bifurcation without stenosis or ulceration. No ICA beading. Left carotid system: ICA tortuosity with loop. Mild plaque at the common carotid and bifurcation. No stenosis or ulceration. Vertebral arteries: No proximal subclavian stenosis. There is bilateral subclavian atherosclerosis. Codominant vertebral arteries that are widely patent and smoothly contoured Skeleton: Facet osteoarthritis which is generalized with particularly bulky left-sided spurring at C4-5. Other neck: No evidence of mass or inflammation. Upper chest: Mild scar-like appearance  in the upper lobes. Review of the MIP images confirms the above findings CTA HEAD FINDINGS Anterior circulation: Atheromatous plaque along the carotid siphons. No branch occlusion, beading, or aneurysm. No proximal flow limiting stenosis Posterior circulation: Vertebral and basilar arteries are smooth and widely patent. Elongation of the basilar which contacts the ventral brain. No branch occlusion, beading, or aneurysm. No proximal significant stenosis. Venous sinuses: Patent Anatomic variants: None significant Review of the MIP images confirms the above findings Prelim results were called by telephone at the time of interpretation on 06/15/2020 at 8:13 am to provider [place] . CT Brain Perfusion Findings: ASPECTS: 10 CBF (<30%) Volume: 15mL Perfusion (Tmax>6.0s) volume: 58mL. This area is 1 slice above the left petrous temporal bone and could be artifactual given history of pure motor weakness. As well, the area appears to extend to the medial temporal lobe,  extending towards PCA distribution IMPRESSION: CTA: 1. No emergent finding. Negative for large vessel occlusion or flow reducing stenosis. 2. Moderate atherosclerosis in the head and neck. CT perfusion: No detected infarct. There is a 7 cc area highlighted in the inferior left temporal lobe, possibly artifactual given the provided history and location along the skull base. Electronically Signed   By: Monte Fantasia M.D.   On: 06/15/2020 08:22   CT Code Stroke CTA Neck W/WO contrast  Result Date: 06/15/2020 CLINICAL DATA:  Right-sided weakness. EXAM: CT ANGIOGRAPHY HEAD AND NECK CT PERFUSION BRAIN TECHNIQUE: Multidetector CT imaging of the head and neck was performed using the standard protocol during bolus administration of intravenous contrast. Multiplanar CT image reconstructions and MIPs were obtained to evaluate the vascular anatomy. Carotid stenosis measurements (when applicable) are obtained utilizing NASCET criteria, using the distal internal carotid diameter as the denominator. Multiphase CT imaging of the brain was performed following IV bolus contrast injection. Subsequent parametric perfusion maps were calculated using RAPID software. CONTRAST:  Dose is currently unknown. COMPARISON:  Head CT from earlier the same day FINDINGS: CTA NECK FINDINGS Aortic arch: Atheromatous plaque.  Three vessel branching. Right carotid system: Mixed density mild for age plaque at the bifurcation without stenosis or ulceration. No ICA beading. Left carotid system: ICA tortuosity with loop. Mild plaque at the common carotid and bifurcation. No stenosis or ulceration. Vertebral arteries: No proximal subclavian stenosis. There is bilateral subclavian atherosclerosis. Codominant vertebral arteries that are widely patent and smoothly contoured Skeleton: Facet osteoarthritis which is generalized with particularly bulky left-sided spurring at C4-5. Other neck: No evidence of mass or inflammation. Upper chest: Mild scar-like  appearance in the upper lobes. Review of the MIP images confirms the above findings CTA HEAD FINDINGS Anterior circulation: Atheromatous plaque along the carotid siphons. No branch occlusion, beading, or aneurysm. No proximal flow limiting stenosis Posterior circulation: Vertebral and basilar arteries are smooth and widely patent. Elongation of the basilar which contacts the ventral brain. No branch occlusion, beading, or aneurysm. No proximal significant stenosis. Venous sinuses: Patent Anatomic variants: None significant Review of the MIP images confirms the above findings Prelim results were called by telephone at the time of interpretation on 06/15/2020 at 8:13 am to provider [place] . CT Brain Perfusion Findings: ASPECTS: 10 CBF (<30%) Volume: 1mL Perfusion (Tmax>6.0s) volume: 55mL. This area is 1 slice above the left petrous temporal bone and could be artifactual given history of pure motor weakness. As well, the area appears to extend to the medial temporal lobe, extending towards PCA distribution IMPRESSION: CTA: 1. No emergent finding. Negative for large vessel occlusion  or flow reducing stenosis. 2. Moderate atherosclerosis in the head and neck. CT perfusion: No detected infarct. There is a 7 cc area highlighted in the inferior left temporal lobe, possibly artifactual given the provided history and location along the skull base. Electronically Signed   By: Monte Fantasia M.D.   On: 06/15/2020 08:22   MR BRAIN WO CONTRAST  Result Date: 06/15/2020 CLINICAL DATA:  Neuro deficit, acute, with stroke suspected. Right-sided weakness EXAM: MRI HEAD WITHOUT CONTRAST TECHNIQUE: Multiplanar, multiecho pulse sequences of the brain and surrounding structures were obtained without intravenous contrast. COMPARISON:  Head CT and CTA from earlier today FINDINGS: Brain: 10 mm acute infarct in the left corona radiata, adjacent to the lateral ventricle. Remote lacunar infarct at the right corona radiata. Ischemic  gliosis is minor. Small focus of remote hemorrhage along the parasagittal left frontal lobe.Generalized cortical volume loss. No hemorrhage, hydrocephalus, or masslike finding. Vascular: Normal flow voids. Skull and upper cervical spine: Normal marrow signal Sinuses/Orbits: Retention cysts along the floor of the right maxillary sinus. IMPRESSION: Acute lacunar infarct at the left corona radiata. Electronically Signed   By: Monte Fantasia M.D.   On: 06/15/2020 10:51   CT Code Stroke Cerebral Perfusion with contrast  Result Date: 06/15/2020 CLINICAL DATA:  Right-sided weakness. EXAM: CT ANGIOGRAPHY HEAD AND NECK CT PERFUSION BRAIN TECHNIQUE: Multidetector CT imaging of the head and neck was performed using the standard protocol during bolus administration of intravenous contrast. Multiplanar CT image reconstructions and MIPs were obtained to evaluate the vascular anatomy. Carotid stenosis measurements (when applicable) are obtained utilizing NASCET criteria, using the distal internal carotid diameter as the denominator. Multiphase CT imaging of the brain was performed following IV bolus contrast injection. Subsequent parametric perfusion maps were calculated using RAPID software. CONTRAST:  Dose is currently unknown. COMPARISON:  Head CT from earlier the same day FINDINGS: CTA NECK FINDINGS Aortic arch: Atheromatous plaque.  Three vessel branching. Right carotid system: Mixed density mild for age plaque at the bifurcation without stenosis or ulceration. No ICA beading. Left carotid system: ICA tortuosity with loop. Mild plaque at the common carotid and bifurcation. No stenosis or ulceration. Vertebral arteries: No proximal subclavian stenosis. There is bilateral subclavian atherosclerosis. Codominant vertebral arteries that are widely patent and smoothly contoured Skeleton: Facet osteoarthritis which is generalized with particularly bulky left-sided spurring at C4-5. Other neck: No evidence of mass or  inflammation. Upper chest: Mild scar-like appearance in the upper lobes. Review of the MIP images confirms the above findings CTA HEAD FINDINGS Anterior circulation: Atheromatous plaque along the carotid siphons. No branch occlusion, beading, or aneurysm. No proximal flow limiting stenosis Posterior circulation: Vertebral and basilar arteries are smooth and widely patent. Elongation of the basilar which contacts the ventral brain. No branch occlusion, beading, or aneurysm. No proximal significant stenosis. Venous sinuses: Patent Anatomic variants: None significant Review of the MIP images confirms the above findings Prelim results were called by telephone at the time of interpretation on 06/15/2020 at 8:13 am to provider [place] . CT Brain Perfusion Findings: ASPECTS: 10 CBF (<30%) Volume: 36mL Perfusion (Tmax>6.0s) volume: 29mL. This area is 1 slice above the left petrous temporal bone and could be artifactual given history of pure motor weakness. As well, the area appears to extend to the medial temporal lobe, extending towards PCA distribution IMPRESSION: CTA: 1. No emergent finding. Negative for large vessel occlusion or flow reducing stenosis. 2. Moderate atherosclerosis in the head and neck. CT perfusion: No detected infarct. There is  a 7 cc area highlighted in the inferior left temporal lobe, possibly artifactual given the provided history and location along the skull base. Electronically Signed   By: Monte Fantasia M.D.   On: 06/15/2020 08:22   CT HEAD CODE STROKE WO CONTRAST  Result Date: 06/15/2020 CLINICAL DATA:  Code stroke.  Left-sided weakness EXAM: CT HEAD WITHOUT CONTRAST TECHNIQUE: Contiguous axial images were obtained from the base of the skull through the vertex without intravenous contrast. COMPARISON:  None. FINDINGS: Brain: No evidence of acute infarction, hemorrhage, hydrocephalus, extra-axial collection or mass lesion/mass effect. Chronic lacunar infarct at the right corona radiata.  Cerebral volume loss in keeping with aging. Vascular: No hyperdense vessel or unexpected calcification. Skull: Normal. Negative for fracture or focal lesion. Sinuses/Orbits: No acute finding. Other: These results were communicated to Dr. Rory Percy at 8:04 amon 9/23/2021by text page via the [facility] messaging system. ASPECTS [facility] Stroke Program Early CT Score) - Ganglionic level infarction (caudate, lentiform nuclei, internal capsule, insula, M1-M3 cortex): 7 - Supraganglionic infarction (M4-M6 cortex): 3 Total score (0-10 with 10 being normal): 10 IMPRESSION: 1. No acute finding.ASPECTS is 10. 2. Chronic lacunar infarct at the right corona radiata. Electronically Signed   By: Monte Fantasia M.D.   On: 06/15/2020 08:05     Assessment/Plan: Diagnosis: L corona radiata infarct 1. Does the need for close, 24 hr/day medical supervision in concert with the patient's rehab needs make it unreasonable for this patient to be served in a less intensive setting? Yes 2. Co-Morbidities requiring supervision/potential complications: tobacco use disorder, PAD, AAA, DMII, RUE severe weakness 3. Due to bladder management, bowel management, safety, skin/wound care, disease management, medication administration, pain management and patient education, does the patient require 24 hr/day rehab nursing? Yes 4. Does the patient require coordinated care of a physician, rehab nurse, therapy disciplines of PT, OT to address physical and functional deficits in the context of the above medical diagnosis(es)? Yes Addressing deficits in the following areas: balance, endurance, locomotion, strength, transferring, bowel/bladder control, bathing, dressing, feeding, grooming, toileting, speech and psychosocial support 5. Can the patient actively participate in an intensive therapy program of at least 3 hrs of therapy per day at least 5 days per week? Yes 6. The potential for patient to make measurable gains while on inpatient rehab is  excellent 7. Anticipated functional outcomes upon discharge from inpatient rehab are independent and modified independent  with PT, modified independent with OT, independent with SLP. 8. Estimated rehab length of stay to reach the above functional goals is: 10-14 days 9. Anticipated discharge destination: Home 10. Overall Rehab/Functional Prognosis: excellent  RECOMMENDATIONS: This patient's condition is appropriate for continued rehabilitative care in the following setting: CIR Patient has agreed to participate in recommended program. Yes Note that insurance prior authorization may be required for reimbursement for recommended care.  Comment: Thank you for this consult. Admission coordinator to follow.   I have personally performed a face to face diagnostic evaluation, including, but not limited to relevant history and physical exam findings, of this patient and developed relevant assessment and plan.  Additionally, I have reviewed and concur with the physician assistant's documentation above.  Leeroy Cha, MD  Lavon Paganini [place], PA-C 06/16/2020        Revision History                     Routing History           Note Details  Author Ranell Patrick, Clide Deutscher, MD File  Time 06/16/2020 11:42 AM  Author Type Physician Status Signed  Last Editor Jacob Ribas, MD Service Physical Medicine and [place] # [id number] Admit Date 06/19/2020

## 2020-06-21 ENCOUNTER — Inpatient Hospital Stay (HOSPITAL_COMMUNITY): Payer: PPO | Admitting: Physical Therapy

## 2020-06-21 ENCOUNTER — Inpatient Hospital Stay (HOSPITAL_COMMUNITY): Payer: PPO

## 2020-06-21 ENCOUNTER — Inpatient Hospital Stay (HOSPITAL_COMMUNITY): Payer: PPO | Admitting: Occupational Therapy

## 2020-06-21 DIAGNOSIS — G8191 Hemiplegia, unspecified affecting right dominant side: Secondary | ICD-10-CM

## 2020-06-21 LAB — CBC WITH DIFFERENTIAL/PLATELET
Abs Immature Granulocytes: 0.06 10*3/uL (ref 0.00–0.07)
Basophils Absolute: 0 10*3/uL (ref 0.0–0.1)
Basophils Relative: 0 %
Eosinophils Absolute: 0.2 10*3/uL (ref 0.0–0.5)
Eosinophils Relative: 1 %
HCT: 41.4 % (ref 39.0–52.0)
Hemoglobin: 13.6 g/dL (ref 13.0–17.0)
Immature Granulocytes: 1 %
Lymphocytes Relative: 8 %
Lymphs Abs: 1 10*3/uL (ref 0.7–4.0)
MCH: 31.8 pg (ref 26.0–34.0)
MCHC: 32.9 g/dL (ref 30.0–36.0)
MCV: 96.7 fL (ref 80.0–100.0)
Monocytes Absolute: 1.3 10*3/uL — ABNORMAL HIGH (ref 0.1–1.0)
Monocytes Relative: 11 %
Neutro Abs: 9.5 10*3/uL — ABNORMAL HIGH (ref 1.7–7.7)
Neutrophils Relative %: 79 %
Platelets: 210 10*3/uL (ref 150–400)
RBC: 4.28 MIL/uL (ref 4.22–5.81)
RDW: 14 % (ref 11.5–15.5)
WBC: 11.9 10*3/uL — ABNORMAL HIGH (ref 4.0–10.5)
nRBC: 0 % (ref 0.0–0.2)

## 2020-06-21 LAB — GLUCOSE, CAPILLARY
Glucose-Capillary: 151 mg/dL — ABNORMAL HIGH (ref 70–99)
Glucose-Capillary: 107 mg/dL — ABNORMAL HIGH (ref 70–99)
Glucose-Capillary: 118 mg/dL — ABNORMAL HIGH (ref 70–99)
Glucose-Capillary: 152 mg/dL — ABNORMAL HIGH (ref 70–99)

## 2020-06-21 NOTE — Progress Notes (Signed)
Physical Therapy Session Note  Patient Details  Name: Jacob Short MRN: [id number] Date of Birth: 23-May-1946  Today's Date: 06/21/2020 PT Individual Time: 3976-7341 + 1300-1345 PT Individual Time Calculation (min): 69 min + 45 min  Short Term Goals: Week 1:  PT Short Term Goal 1 (Week 1): Pt will perform bed mobility with minA PT Short Term Goal 2 (Week 1): Pt will perform bed<>chair transfer with minA and LRAD PT Short Term Goal 3 (Week 1): Pt will ambulate at least 50ft with minA and LRAD PT Short Term Goal 4 (Week 1): Pt will navigate up/down at least 4 steps with minA and LRAD  Skilled Therapeutic Interventions/Progress Updates:   1st session:   Pt received sitting in w/c, agreeable and motivated to participate in PT session; denies pain. WC transport for time management to day room gym. Performed gait training ~28ft with min/modA and QC with cues for QC sequencing, widening BOS, lateral weight shifting, and stepping pattern. Pt with good carryover of sequencing but difficulty correcting gait deficits, especially his narrow BOS and R lateral lean, although able to correct ~25-50% with cues. Using mirror for visual feedback, performed toe-taps with both LLE & RLE on 4inch block with min/modA and quad cane support with LUE. Focused on R foot placement, single leg balance, and hip flexor strength. Using mirror to assist with erect posture, foot placement, and to reduce R lateral lean. Seated rest breaks required b/w sets of 4x15. Performed seated there-ex with 4# ankle weight on RLE, 2x10 LAQ with 5sec isometric holds + 2x10 hip marches with 5sec isometric holds. Ambulated an additional 53ft with min/modA and QC to mat table and performed neuo-re ed for RUE strengthening. Performed 3x15 seated reaches with LUE across midline with therapist facilitating WB through RUE with efforts to activate proximal shldr musculature, tricep extension, and proprioceptive feedback. Stand<>pivot with minA from  mat table to WC and WC transport back to his room with totalA for time management. Stand<>pivot with minA back to bed and required minA for sit>supine for RLE management. With HOB flat, he was able to use LUE to headboard to assist with supine scooting for repositioning. Pt continent of voiding in urinal once he was seated, requiring assist for placement and holding. Ended session with 3/4 rails up, bed alarm on, needs in reach.  2nd session: Pt received supine in bed, wife at bedside, pt agreeable to PT session, denies pain. Spoke with wife briefly who reports she will be able to provide initial 24/7 S/A post DC if needed. Supine<>sit with minA and HOB elevated, using bed features, cues for RUE awareness and assist for forward scooting at EOB. Stand<>pivot with minA towards his weaker R side from EOB to w/c. WC transport from his room to dayroom gym for time management. Stand<>pivot with minA from w/c to mat table. Performed 4x10 (seated rest breaks) sit<>stands from lowered mat table height with minA and no AD, therapist facilitating R quad and knee extension during stance. For each set of stands, advanced LLE intentionally to isolate RLE for strengthening. Performed gait training 11ft with LHR and minA + 14ft with min/modA and no HR support. Cues for widening BOS, increasing R step length, increasing R heel strike, and increased B hip extension to improve cadence/step length. WC transport back to his room for time management, stand<>pivot with minA from w/c to EOB, minA for sit>supine for RLE management. Ended session supine in bed with 3/4 rails up, bed alarm on, needs in reach.  Therapy Documentation Precautions:  Precautions Precautions: Fall Precaution Comments: R hemiparesis UE>LE Restrictions Weight Bearing Restrictions: No  Therapy/Group: Individual Therapy  Claxton Levitz P Sha Amer PT 06/21/2020, 10:57 AM

## 2020-06-21 NOTE — Progress Notes (Signed)
Occupational Therapy Session Note  Patient Details  Name: Jacob Short MRN: [id number] Date of Birth: December 12, 1945  Today's Date: 06/21/2020 OT Individual Time: 1451-1536 OT Individual Time Calculation (min): 45 min    Short Term Goals: Week 1:  OT Short Term Goal 1 (Week 1): Pt will achieve 75 degrees Active abduction during dressing and bathing right shoulder. OT Short Term Goal 2 (Week 1): Pt will complete LB dressing with min assist. OT Short Term Goal 3 (Week 1): Pt will complete toilet transfer with min assist. OT Short Term Goal 4 (Week 1): Pt will complete LB bathing with min assist.  Skilled Therapeutic Interventions/Progress Updates:    Pt supine in bed, no c/o pain, agreeable to OT session.  Pt completed supine to sit with HOB slightly raised with CGA and mod intermittent VCs for body mechanics.  Squat pivot transfer to w/c with mod assist.  Pt propelled self to sink with min assist. Pt completed UB dressing with setup and UB bathing with mod assist for LUE using hand over hand technique and for back.  Pt doffed pants with mod assist to pull over hips and feet and min assist for sit <>stand balance while blocking knee.  Pt bathed LB with min assist during standing for buttocks, and pt deferred bathing bilateral feet per his request.  Pt donned brief and pants with mod assist to thread over BLE and pull over right hip.  Pt requesting back to bed at end of session.  Completed SPT without AD needing mod assist.  Call bell in reach, bed alarm on.  Therapy Documentation Precautions:  Precautions Precautions: Fall Precaution Comments: R hemiparesis UE>LE Restrictions Weight Bearing Restrictions: No   Therapy/Group: Individual Therapy  [name] 06/21/2020, 12:52 PM

## 2020-06-21 NOTE — Progress Notes (Signed)
[facility] PHYSICAL MEDICINE & REHABILITATION PROGRESS NOTE   Subjective/Complaints: Patient seen sitting up in his chair this morning.  He states he slept well overnight.  He states he had a good first day of therapies yesterday.  He received his WHO, but it was not donned overnight.  ROS: Denies CP, SOB, N/V/D  Objective:   No results found. Recent Labs    06/20/20 0454 06/21/20 0744  WBC 13.0* 11.9*  HGB 14.2 13.6  HCT 42.7 41.4  PLT 217 210   Recent Labs    06/20/20 0454  NA 134*  K 3.8  CL 102  CO2 23  GLUCOSE 146*  BUN 9  CREATININE 0.56*  CALCIUM 8.9    Intake/Output Summary (Last 24 hours) at 06/21/2020 1036 Last data filed at 06/21/2020 0715 Gross per 24 hour  Intake 780 ml  Output 1085 ml  Net -305 ml        Physical Exam: Vital Signs Blood pressure (!) 183/74, pulse 83, temperature 98.3 F (36.8 C), temperature source Oral, resp. rate 17, height 6\' 4"  (1.93 m), weight 83.5 kg, SpO2 98 %. Constitutional: No distress . Vital signs reviewed. HENT: Normocephalic.  Atraumatic. Eyes: EOMI. No discharge. Cardiovascular: No JVD.  RRR. Respiratory: Normal effort.  No stridor.  Bilateral clear to auscultation. GI: Non-distended.  BS +. Skin: Warm and dry.  Intact. Psych: Normal mood.  Normal behavior. Musc: No edema in extremities.  No tenderness in extremities. Neuro: Alert Mild dysarthria, unchanged Fair awareness of deficits.  Right facial droop Motor: RUE: RUE 0/5 proximal distal, unchanged RLE: 4-4+/5 proximal to distal Left upper extremity/left lower extremities: 5/5 proximal to distal   Assessment/Plan: 1. Functional deficits secondary to left corona radiata infarct which require 3+ hours per day of interdisciplinary therapy in a comprehensive inpatient rehab setting.  Physiatrist is providing close team supervision and 24 hour management of active medical problems listed below.  Physiatrist and rehab team continue to assess barriers to  discharge/monitor patient progress toward functional and medical goals  Care Tool:  Bathing              Bathing assist Assist Level: Moderate Assistance - Patient 50 - 74%     Upper Body Dressing/Undressing Upper body dressing   What is the patient wearing?: Hospital gown only    Upper body assist Assist Level: Minimal Assistance - Patient > 75%    Lower Body Dressing/Undressing Lower body dressing      What is the patient wearing?: Incontinence brief     Lower body assist Assist for lower body dressing: Maximal Assistance - Patient 25 - 49%     Toileting Toileting    Toileting assist       Transfers Chair/bed transfer  Transfers assist     Chair/bed transfer assist level: Moderate Assistance - Patient 50 - 74%     Locomotion Ambulation   Ambulation assist      Assist level: Moderate Assistance - Patient 50 - 74% Assistive device: No Device Max distance: 80ft   Walk 10 feet activity   Assist     Assist level: Moderate Assistance - Patient - 50 - 74% Assistive device: No Device   Walk 50 feet activity   Assist    Assist level: Moderate Assistance - Patient - 50 - 74% Assistive device: No Device    Walk 150 feet activity   Assist Walk 150 feet activity did not occur: Safety/medical concerns (fatigue after 95ft)  Walk 10 feet on uneven surface  activity   Assist Walk 10 feet on uneven surfaces activity did not occur: Safety/medical concerns         Wheelchair     Assist Will patient use wheelchair at discharge?: No             Wheelchair 50 feet with 2 turns activity    Assist            Wheelchair 150 feet activity     Assist          Medical Problem List and Plan: 1. Right side hemiparesis secondary to left corona radiata infarct secondary small vessel disease  Continue CIR  WHO don nightly  Team conference today to discuss current and goals and coordination of care, home and  environmental barriers, and discharge planning with nursing, case manager, and therapies. Please see conference note from today as well.  2.  Antithrombotics: -DVT/anticoagulation: Lovenox.             -antiplatelet therapy: Current plan is for aspirin 81 mg daily and Brilinta 90 mg twice daily x30 days then transition back to aspirin and Plavix 3. Pain Management: Denies pain.  4. Mood: Provide emotional support             -antipsychotic agents: N/A 5. Neuropsych: This patient is capable of making decisions on his own behalf. 6. Skin/Wound Care: Routine skin checks 7. Fluids/Electrolytes/Nutrition: Routine in and outs. 8.  CAD with angioplasty.  Continue aspirin. 9.  Diabetes mellitus with peripheral neuropathy.  Hemoglobin A1c 6.9.  Glucophage 1000 mg at breakfast 1500 mg with supper.    Elevated on 9/29, will consider medication increase if persistent  Monitor with increased mobility 10.  Hyperlipidemia.  Lipitor 11.  Tobacco abuse: Patient states that he has quit.  Will DC nicotine patch. 12.  Hypertension.  Cozaar 25 mg daily.    Was relatively controlled, however significantly elevated this a.m., will consider medication increase if persistent  Monitor with increased mobility. 13.  History of AAA without rupture.  Follow-up outpatient monitoring.  14.  Hyponatremia  Sodium 134 on 9/20  Continue to monitor 15.  Hypoalbuminemia  Supplement initiated on 9/28 16.  Leukocytosis  WBCs 11.9 on 9/29  Afebrile  Continue to monitor  LOS: 2 days A FACE TO FACE EVALUATION WAS PERFORMED  Britnay Magnussen Lorie Phenix 06/21/2020, 10:36 AM

## 2020-06-21 NOTE — Progress Notes (Signed)
Patient ID: Jacob Short, male   DOB: 07-17-1946, 74 y.o.   MRN: [id number]  Met with pt and wife who is here to inform team conference goal supervision level and target discharge date 10/19. She reports she works as a [employer] cabin crew and can take off if needed. She plans to do this and can provide supervision for pt at discharge. Discussed home health versus OP will see what will need when gets closer to discharge.

## 2020-06-21 NOTE — Patient Care Conference (Signed)
Inpatient RehabilitationTeam Conference and Plan of Care Update Date: 06/21/2020   Time: 11:05 AM    Patient Name: Jacob Short [facility]      Medical Record Number: [id number]  Date of Birth: August 25, 1946 Sex: Male         Room/Bed: 4M05C/4M05C-01 Payor Info: Payor: [employer] / Plan: [employer] PPO / Product Type: *No Product type* /    Admit Date/Time:  06/19/2020  5:22 PM  Primary Diagnosis:  Small vessel disease, cerebrovascular  Hospital Problems: Principal Problem:   Small vessel disease, cerebrovascular Active Problems:   Leukocytosis   Hypoalbuminemia due to protein-calorie malnutrition ([place])   Hyponatremia   Essential hypertension   Diabetic peripheral neuropathy ([place])   Right hemiparesis [facility])    Expected Discharge Date: Expected Discharge Date: 07/11/20  Team Members Present: Physician leading conference: Dr. Delice Lesch Care Coodinator Present: Dorien Chihuahua, RN, BSN, CRRN;Other (comment) (Becky Dupree Sw) Nurse Present: Other (comment) Abigail Miyamoto, RN) PT Present:  (Christain Manhard, PT) OT Present: Leretha Pol, OT PPS Coordinator present : Gunnar Fusi, SLP     Current Status/Progress Goal Weekly Team Focus  Bowel/Bladder   Continent x2  maintain continence and increase independence with toileting  assessing toileting needs Q2 hours and as needed.   Swallow/Nutrition/ Hydration             ADL's   min/mod A functional transfers, minA/sup UB ADLs, mod/max LB ADLs  supervision  self care training, functional transfer training   Mobility   min/modA bed mobility and transfers, gait 39ft minA with LHR and modA no AD  supervision  standing balance, L HB NMR, gait with LRAD   Communication             Safety/Cognition/ Behavioral Observations            Pain   currently no pain  maintain a pain goal of less than 3 on a 0-10 scale  assess pain q4 hours and PRN   Skin   Skin intact  Maintain skin integrity  Supporting affected side with  pilows. Reposition Q2 hours and as needed.     Discharge Planning:  Home with wife who can take off some time form work if needed, since goals are supervision will need too. Pt hopes to do better than goals of supervision   Team Discussion: BP fluctuates Making progress and motivated  Patient on target to meet rehab goals: yes, supervision goals  *See Care Plan and progress notes for long and short-term goals.   Revisions to Treatment Plan:  Introduced use of half lap tray  Teaching Needs: Transfers, toileting, medications, etc.  Current Barriers to Discharge: Decreased caregiver support; wife works  Possible Resolutions to Barriers: Family education     Medical Summary Current Status: Right side hemiparesis secondary to left corona radiata infarct secondary small vessel disease  Barriers to Discharge: Medical stability   Possible Resolutions to [employer] Focus: Therapies, optimize BP/DM meds, follow WBCs, Na+   Continued Need for Acute Rehabilitation Level of Care: The patient requires daily medical management by a physician with specialized training in physical medicine and rehabilitation for the following reasons: Direction of a multidisciplinary physical rehabilitation program to maximize functional independence : Yes Medical management of patient stability for increased activity during participation in an intensive rehabilitation regime.: Yes Analysis of laboratory values and/or radiology reports with any subsequent need for medication adjustment and/or medical intervention. : Yes   I attest that I was present, lead the team  conference, and concur with the assessment and plan of the team.   Margarito Liner 06/21/2020, 1:42 PM

## 2020-06-21 NOTE — Progress Notes (Signed)
Physical Therapy Session Note  Patient Details  Name: Jacob Short MRN: [id number] Date of Birth: 20-Sep-1946  Today's Date: 06/21/2020 PT Individual Time: 0755-0850 PT Individual Time Calculation (min): 55 min   Short Term Goals: Week 1:  PT Short Term Goal 1 (Week 1): Pt will perform bed mobility with minA PT Short Term Goal 2 (Week 1): Pt will perform bed<>chair transfer with minA and LRAD PT Short Term Goal 3 (Week 1): Pt will ambulate at least 109f with minA and LRAD PT Short Term Goal 4 (Week 1): Pt will navigate up/down at least 4 steps with minA and LRAD  Skilled Therapeutic Interventions/Progress Updates:    Pt received supine in bed and agreeable to PT. Supine>sit transfer with min assist and cues for trunk control and RUE awareness.   [facility] and moderate cues for sequencing.   Gait training with min-mod assist x 436fwithout AD. Noted ataxia on the R as well as narrow BOS and R lateral lean. Able to correct 50% of the time with cues from PT to improve step width and weight shift L. PT also instructed pt in gait training with visual feed back of tape on floor to improve step width and use of mirror for midline orientaiton. Noted to have improved weight shift and step width visual cues.    Pt performed 5xSTS: 34 sec (>15 sec indicates increased fall risk)   Pt performed weight shifting and midline orientation training to attempt semi tandem stance ove hockey stick 2 x 10 sec hold with mod assist and visual feedback from mirror; diffculty achieving step through position on the R. Pt then perform partial semitandem stance to place foot on target 3x 10 sec with min assist overall, improved postural control and midline orientation with increased repetiaitons.   PT instructed pt in RUE NMR to perform chest press, ER/IR and overhead push with digits extended and AAROM to end range. Completed x 8 with 3 sec hold on last 2 reps of each exercise.    [facility] returned to room and [facility] in reach and all needs met.             Therapy Documentation Precautions:  Precautions Precautions: Fall Precaution Comments: R hemiparesis UE>LE Restrictions Weight Bearing Restrictions: No   Vital Signs: Therapy Vitals Temp: 98.3 F (36.8 C) Temp Source: Oral Pulse Rate: 83 Resp: 17 BP: (!) 183/74 Patient Position (if appropriate): Lying Oxygen Therapy SpO2: 98 % O2 Device: Room Air Pain:   denies   Therapy/Group: Individual Therapy  AuLorie Phenix/29/2021, 8:52 AM

## 2020-06-22 ENCOUNTER — Inpatient Hospital Stay (HOSPITAL_COMMUNITY): Payer: PPO

## 2020-06-22 ENCOUNTER — Inpatient Hospital Stay (HOSPITAL_COMMUNITY): Payer: PPO | Admitting: Occupational Therapy

## 2020-06-22 ENCOUNTER — Inpatient Hospital Stay (HOSPITAL_COMMUNITY): Payer: PPO | Admitting: Physical Therapy

## 2020-06-22 LAB — GLUCOSE, CAPILLARY
Glucose-Capillary: 139 mg/dL — ABNORMAL HIGH (ref 70–99)
Glucose-Capillary: 157 mg/dL — ABNORMAL HIGH (ref 70–99)
Glucose-Capillary: 128 mg/dL — ABNORMAL HIGH (ref 70–99)
Glucose-Capillary: 141 mg/dL — ABNORMAL HIGH (ref 70–99)

## 2020-06-22 MED ORDER — GLIPIZIDE 5 MG PO TABS
2.5000 mg | ORAL_TABLET | Freq: Every day | ORAL | Status: DC
  Administered 2020-06-23 – 2020-07-11 (×19): 2.5 mg via ORAL
  Filled 2020-06-22 (×19): qty 1

## 2020-06-22 NOTE — Progress Notes (Signed)
Physical Therapy Session Note  Patient Details  Name: Jacob Short MRN: [id number] Date of Birth: 01-Jun-1946  Today's Date: 06/22/2020 PT Individual Time: 1625-1650 PT Individual Time Calculation (min): 25 min   Short Term Goals: Week 1:  PT Short Term Goal 1 (Week 1): Pt will perform bed mobility with minA PT Short Term Goal 2 (Week 1): Pt will perform bed<>chair transfer with minA and LRAD PT Short Term Goal 3 (Week 1): Pt will ambulate at least 93ft with minA and LRAD PT Short Term Goal 4 (Week 1): Pt will navigate up/down at least 4 steps with minA and LRAD  Skilled Therapeutic Interventions/Progress Updates:    Pt received supine in bed resting but agreeable to therapy session. Supine>sitting L EOB, HOB partially elevated and using bedrail, with min assist for R UE management and trunk upright. Scoot towards EOB with min assist facilitating R hip mobility. Sitting EOB with L UE holding bedrail for trunk support while therapist donned shoes max assist for time management. R stand pivot EOB>w/c using L UE support on bedrail with min assist for balance - facilitation of R/L weight shift to initiate stepping. Transported to/from gym in w/c for time management and energy conservation. Sit>stands from w/c with mod assist as pt noted to have R LE hip externally rotated causing R foot to move too close to midline resulting in narrow BOS once standing causing R lateral trunk lean/LOB - therapist repositioned R LE and provided manual facilitation for increased weightbearing to maintain improved R LE alignment and performed repeated sit<>stands but then pt reported onset of some R anterior hip/groin discomfort - difficult to determine if this alignment is structural but pt does report hx of "weak hips (R>L)." Gait training ~77ft forward/backwards at L hallway rail using L UE support as needed - required mod assist for balance ambulating forward due to scissoring/narrow BOS, decreased R LE step length  and with fatigue decreased ability to clear R foot to advance it, increased anterior trunk flexion, and increased postural sway - required lighter mod assist for ambulating backwards with more consistent LUE support on rail though pt maintained crouched posture with B LE hip/knee flexion despite cuing to improve. R/L lateral side stepping ~58ft down/back at hallway rail for L UE support and min progressed to strong mod assist for balance - demos short R step lengths towards R and then significant fatigue stepping back to L causing severe crouched posturing with trunk and BLE hip/knee flexion posture with heavy assist for R/L weight shift to perform steps to return safely back to w/c. Transported back to room and pt requesting to return to bed. L stand pivot w/c>EOB with L UE support on bedrail and min assist for balance - cuing for for weight shifting. Sit>supine with CGA for steadying. Pt left supine in bed with needs in reach and bed alarm on.  Therapy Documentation Precautions:  Precautions Precautions: Fall Precaution Comments: R hemiparesis UE>LE Restrictions Weight Bearing Restrictions: No  Pain: No reports of pain throughout session.   Therapy/Group: Individual Therapy  Tawana Scale , PT, DPT, CSRS  06/22/2020, 3:21 PM

## 2020-06-22 NOTE — Progress Notes (Signed)
[facility] PHYSICAL MEDICINE & REHABILITATION PROGRESS NOTE   Subjective/Complaints: Patient seen sitting up in bed this morning, awaiting therapies.  He states he slept well overnight.  He states he worries WHO overnight.  ROS: Denies CP, SOB, N/V/D  Objective:   No results found. Recent Labs    06/20/20 0454 06/21/20 0744  WBC 13.0* 11.9*  HGB 14.2 13.6  HCT 42.7 41.4  PLT 217 210   Recent Labs    06/20/20 0454  NA 134*  K 3.8  CL 102  CO2 23  GLUCOSE 146*  BUN 9  CREATININE 0.56*  CALCIUM 8.9    Intake/Output Summary (Last 24 hours) at 06/22/2020 1257 Last data filed at 06/22/2020 0825 Gross per 24 hour  Intake 802 ml  Output 150 ml  Net 652 ml        Physical Exam: Vital Signs Blood pressure (!) 153/77, pulse 82, temperature 98.4 F (36.9 C), temperature source Oral, resp. rate 17, height 6\' 4"  (1.93 m), weight 87.5 kg, SpO2 97 %. Constitutional: No distress . Vital signs reviewed. HENT: Normocephalic.  Atraumatic. Eyes: EOMI. No discharge. Cardiovascular: No JVD.  RRR. Respiratory: Normal effort.  No stridor.  Bilateral clear to auscultation. GI: Non-distended.  BS +. Skin: Warm and dry.  Intact. Psych: Normal mood.  Normal behavior. Musc: No edema in extremities.  No tenderness in extremities. Neuro: Alert Mild dysarthria, stable Fair awareness of deficits.  Right facial droop Motor: RUE: RUE 0/5 proximal distal, stable RLE: 4-4+/5 proximal to distal Left upper extremity/left lower extremities: 5/5 proximal to distal   Assessment/Plan: 1. Functional deficits secondary to left corona radiata infarct which require 3+ hours per day of interdisciplinary therapy in a comprehensive inpatient rehab setting.  Physiatrist is providing close team supervision and 24 hour management of active medical problems listed below.  Physiatrist and rehab team continue to assess barriers to discharge/monitor patient progress toward functional and medical  goals  Care Tool:  Bathing              Bathing assist Assist Level: Moderate Assistance - Patient 50 - 74%     Upper Body Dressing/Undressing Upper body dressing   What is the patient wearing?: Hospital gown only    Upper body assist Assist Level: Minimal Assistance - Patient > 75%    Lower Body Dressing/Undressing Lower body dressing      What is the patient wearing?: Incontinence brief     Lower body assist Assist for lower body dressing: Minimal Assistance - Patient > 75%     Toileting Toileting    Toileting assist Assist for toileting: Moderate Assistance - Patient 50 - 74% Assistive Device Comment: bedpan   Transfers Chair/bed transfer  Transfers assist     Chair/bed transfer assist level: Moderate Assistance - Patient 50 - 74%     Locomotion Ambulation   Ambulation assist      Assist level: Moderate Assistance - Patient 50 - 74% Assistive device: Other (comment) (L HR) Max distance: 274ft   Walk 10 feet activity   Assist     Assist level: Moderate Assistance - Patient - 50 - 74% Assistive device: No Device, Other (comment) (L HR)   Walk 50 feet activity   Assist    Assist level: Moderate Assistance - Patient - 50 - 74% Assistive device: No Device, Other (comment) (L HR)    Walk 150 feet activity   Assist Walk 150 feet activity did not occur: Safety/medical concerns (fatigue after 28ft)  Assist level: Moderate Assistance - Patient - 50 - 74% Assistive device: No Device, Other (comment) (L HR)    Walk 10 feet on uneven surface  activity   Assist Walk 10 feet on uneven surfaces activity did not occur: Safety/medical concerns         Wheelchair     Assist Will patient use wheelchair at discharge?: No             Wheelchair 50 feet with 2 turns activity    Assist            Wheelchair 150 feet activity     Assist          Medical Problem List and Plan: 1. Right side hemiparesis  secondary to left corona radiata infarct secondary small vessel disease  Continue CIR  WHO don nightly 2.  Antithrombotics: -DVT/anticoagulation: Lovenox.             -antiplatelet therapy: Current plan is for aspirin 81 mg daily and Brilinta 90 mg twice daily x30 days then transition back to aspirin and Plavix 3. Pain Management: Denies pain.  4. Mood: Provide emotional support             -antipsychotic agents: N/A 5. Neuropsych: This patient is capable of making decisions on his own behalf. 6. Skin/Wound Care: Routine skin checks 7. Fluids/Electrolytes/Nutrition: Routine in and outs. 8.  CAD with angioplasty.  Continue aspirin. 9.  Diabetes mellitus with peripheral neuropathy.  Hemoglobin A1c 6.9.  Glucophage 1000 mg at breakfast 1500 mg with supper.    Elevated on 9/30  Glipizide 2.5 started on 10/1  Monitor with increased mobility 10.  Hyperlipidemia.  Lipitor 11.  Tobacco abuse: Patient states that he has quit.  Will DC nicotine patch. 12.  Hypertension.  Cozaar 25 mg daily.    Elevated, but improving on 9/30, monitor for trend  Monitor with increased mobility. 13.  History of AAA without rupture.  Follow-up outpatient monitoring.  14.  Hyponatremia  Sodium 134 on 9/20, labs are for tomorrow  Continue to monitor 15.  Hypoalbuminemia  Supplement initiated on 9/28 16.  Leukocytosis  WBCs 11.9 on 9/29  Afebrile  Continue to monitor  LOS: 3 days A FACE TO FACE EVALUATION WAS PERFORMED  Marget Outten Lorie Phenix 06/22/2020, 12:57 PM

## 2020-06-22 NOTE — Progress Notes (Signed)
Physical Therapy Session Note  Patient Details  Name: Jacob Short MRN: [id number] Date of Birth: [birth year]  Today's Date: 06/22/2020 PT Individual Time: 1062-6948 + 1400-1454 PT Individual Time Calculation (min): 55 min +54 min  Short Term Goals: Week 1:  PT Short Term Goal 1 (Week 1): Pt will perform bed mobility with minA PT Short Term Goal 2 (Week 1): Pt will perform bed<>chair transfer with minA and LRAD PT Short Term Goal 3 (Week 1): Pt will ambulate at least 63ft with minA and LRAD PT Short Term Goal 4 (Week 1): Pt will navigate up/down at least 4 steps with minA and LRAD  Skilled Therapeutic Interventions/Progress Updates:   1st session:   Pt received supine in bed, awake and agreeable to PT session, denies pain. He reports he wore his RUE WHO during the night without discomfort. Supine<>sit with minA with use of bed features, initially requiring minA for sitting balance, progressing to SBA. Required minA for forward scooting at EOB to feet flat and demo's small amplitude bouts with decreased efficiency. Stand<>pivot transfer with modA from raised EOB height to w/c, towards his weaker R side, cues for stepping pattern, safety, and technique. Donned his shoes with totalA for time management and WC transport to hallway outside main therapy gym where focus of session was to progress gait training. Sit<>stand with minA in w/c to L HR in hallway, ambulated 227ft using L HR (intermittently) and modA. Initially he only requires minA, however with fatigue requires modA due to R lateral lean. Pt with improved stepping pattern and ability to maintain wide BOS compared to yesterday's session. After seated rest, he ambulated an additional 2x64ft wiith L HR and min/modA, using 0.75# ankle weight to RLE to assist prioceptive feedback and also trialed tapered heel wedge to assist with prevent R knee hyperextension. Tapered heel wedge seemed to promote too much R knee buckling rather than promoting  extension, thus deferred further trials. Pt then performed 1x5 step-downs on 2inch block, focusing on RLE eccentric knee control  requiring modA for stability. WC transport back to his room for time management, stand<>pivot with minA towards stronger L side. MinA for sit>supine for RLE management. Ended session with 3/4 bed rails up, needs in reach, bed alarm on.  2nd session: Pt received sleeping supine in bed, awakens to voice, agreeable to PT session and denies any pain. Supine<>sit with minA with HOB slightly elevated, initially requiring minA for sitting balance 2/2 posterior lean. MinA for forward scooting at EOB and stand<>pivot with modA going towards his weaker R side from EOB to w/c. WC transport to main therapy gym, stand<>pivot with minA from w/c to mat table. Performed repeated sit<>stands from lowered mat height 3x10 with minA and facilitation of R knee extension and forward lean. Ambulated ~36ft with minA and no AD towards // bars and performed 1x10 mini-squats with BUE grip (therapist assisting with hand-over-hand to maintain RUE grip) while facing // bars. After a brief seated rest, he then reporting need for urgent BM. Stand<>pivot with minA from mat table to w/c and transported back to his room with totalA. He ambulated in his room with modA and no AD to bathroom, stand<>sit with minA for controlled lowering to toilet (BSC over toilet). Pt continent of bowel rather quickly, performed sit<>stand with minA from toilet and pt was able to use LUE to perform pericare with therapist providing minA guard. He then ambulated from his bathroom to EOB (~65ft) with minA and no AD, stand<>sit with minA,  sit<>supine with minA for RLE management. Ended session semi-reclined in bed, needs in reach, bed alarm on.  Therapy Documentation Precautions:  Precautions Precautions: Fall Precaution Comments: R hemiparesis UE>LE Restrictions Weight Bearing Restrictions: No  Therapy/Group: Individual  Therapy  Tora Prunty P Mitsuye Schrodt PT 06/22/2020, 10:24 AM

## 2020-06-22 NOTE — Plan of Care (Signed)
  Problem: Consults Goal: RH STROKE PATIENT EDUCATION Description: See Patient Education module for education specifics  Outcome: Progressing Goal: Diabetes Guidelines if Diabetic/Glucose > 140 Description: If diabetic or lab glucose is > 140 mg/dl - Initiate Diabetes/Hyperglycemia Guidelines & Document Interventions  Outcome: Progressing   Problem: RH BOWEL ELIMINATION Goal: RH STG MANAGE BOWEL WITH ASSISTANCE Description: STG Manage Bowel with Min Assistance. Pt will perform pericare and hygiene with min assistance. Will have normal bowel pattern. Outcome: Progressing Goal: RH STG MANAGE BOWEL W/MEDICATION W/ASSISTANCE Description: STG Manage Bowel with Medication with  [place]. Pt will be knowledgeable regarding factors involved with constipation and medications to prevent constipation.  Outcome: Progressing   Problem: RH BLADDER ELIMINATION Goal: RH STG MANAGE BLADDER WITH ASSISTANCE Description: STG Manage Bladder With  Min Assistance. Pt will be able to perform pericare and use equipment with min assist Outcome: Progressing Goal: RH STG MANAGE BLADDER WITH EQUIPMENT WITH ASSISTANCE Description: STG Manage Bladder With Equipment With Assistance Outcome: Progressing   Problem: RH SKIN INTEGRITY Goal: RH STG SKIN FREE OF INFECTION/BREAKDOWN Description: Pt will be knowledgeable of techniques to maintain skin integrity with minimal cues.  Outcome: Progressing Goal: RH STG MAINTAIN SKIN INTEGRITY WITH ASSISTANCE Description: STG Maintain Skin Integrity With min Assistance. Outcome: Progressing   Problem: RH SAFETY Goal: RH STG ADHERE TO SAFETY PRECAUTIONS W/ASSISTANCE/DEVICE Description: STG Adhere to Safety Precautions With  verbal cues  supervision /Devices per therapy Outcome: Progressing   Problem: RH PAIN MANAGEMENT Goal: RH STG PAIN MANAGED AT OR BELOW PT'S PAIN GOAL Description: Pt will be free of pain. Outcome: Progressing   Problem: RH KNOWLEDGE  DEFICIT Goal: RH STG INCREASE KNOWLEDGE OF DIABETES Description: Pt will be knowledgeable of disease process and will understand medications, use of equipment, dietary restrictions. Importance of compliance with verbal cues Outcome: Progressing Goal: RH STG INCREASE KNOWLEDGE OF HYPERTENSION Description: Pt will be knowledgeable regarding disease process medications and importance of compliance with verbal cue assist Outcome: Progressing Goal: RH STG INCREASE KNOWLEGDE OF HYPERLIPIDEMIA Description: Pt will be knowledgeable regarding disease process medical management with verbal cue assist Outcome: Progressing Goal: RH STG INCREASE KNOWLEDGE OF STROKE PROPHYLAXIS Description: Pt will be knowledgeable regarding disease process, prevention, medications, and importance of compliance. Knowledgeable of s/sx of stroke with verbal cues.   Outcome: Progressing

## 2020-06-22 NOTE — Progress Notes (Signed)
Occupational Therapy Session Note  Patient Details  Name: Jacob Short MRN: [id number] Date of Birth: 09-Nov-1945  Today's Date: 06/22/2020 OT Individual Time: 1105-1205 OT Individual Time Calculation (min): 60 min    Short Term Goals: Week 1:  OT Short Term Goal 1 (Week 1): Pt will achieve 75 degrees Active abduction during dressing and bathing right shoulder. OT Short Term Goal 2 (Week 1): Pt will complete LB dressing with min assist. OT Short Term Goal 3 (Week 1): Pt will complete toilet transfer with min assist. OT Short Term Goal 4 (Week 1): Pt will complete LB bathing with min assist.  Skilled Therapeutic Interventions/Progress Updates:    Pt supine in bed, no c/o pain, agreeable to OT session.  Pt completed supine to sit and SPT EOB to w/c without AD with min assist.  Pt transported to bathroom for SPT w/c to TTB with min assist.  Pt completed all UB dressing with supervision.  LB dressing completed using reacher with VCs for technique follow through and min assist during sit<>stand and pulling brief and pants over hips.  Pt bathed UB with min assist for LUE due to no long handled sponges currently available.  LB bathing completed with mod assist for bilateral feet due to impaired hip ROM with limited ability to achieve figure 4 positioning and assist to bathe buttocks in standing.  Donning of brief and pants using reacher with same assist level as doffing.  Pt completed SPT TTB to w/c and w/c to EOB with min assist.  CGA sit to supine.  Call bell in reach, bed alarm on.  Pt demonstrated improved RLE strength and motor control today with increased independence during functional transfers.  Therapy Documentation Precautions:  Precautions Precautions: Fall Precaution Comments: R hemiparesis UE>LE Restrictions Weight Bearing Restrictions: No   Therapy/Group: Individual Therapy  [name] 06/22/2020, 4:15 PM

## 2020-06-23 ENCOUNTER — Inpatient Hospital Stay (HOSPITAL_COMMUNITY): Payer: PPO | Admitting: Occupational Therapy

## 2020-06-23 ENCOUNTER — Inpatient Hospital Stay (HOSPITAL_COMMUNITY): Payer: PPO | Admitting: Physical Therapy

## 2020-06-23 ENCOUNTER — Inpatient Hospital Stay (HOSPITAL_COMMUNITY): Payer: PPO

## 2020-06-23 LAB — GLUCOSE, CAPILLARY
Glucose-Capillary: 123 mg/dL — ABNORMAL HIGH (ref 70–99)
Glucose-Capillary: 118 mg/dL — ABNORMAL HIGH (ref 70–99)
Glucose-Capillary: 119 mg/dL — ABNORMAL HIGH (ref 70–99)
Glucose-Capillary: 113 mg/dL — ABNORMAL HIGH (ref 70–99)

## 2020-06-23 LAB — BASIC METABOLIC PANEL
Anion gap: 9 (ref 5–15)
BUN: 11 mg/dL (ref 8–23)
CO2: 25 mmol/L (ref 22–32)
Calcium: 8.7 mg/dL — ABNORMAL LOW (ref 8.9–10.3)
Chloride: 100 mmol/L (ref 98–111)
Creatinine, Ser: 0.69 mg/dL (ref 0.61–1.24)
GFR calc Af Amer: >60 mL/min (ref >60)
GFR calc non Af Amer: >60 mL/min (ref >60)
Glucose, Bld: 137 mg/dL — ABNORMAL HIGH (ref 70–99)
Potassium: 4.3 mmol/L (ref 3.5–5.1)
Sodium: 134 mmol/L — ABNORMAL LOW (ref 135–145)

## 2020-06-23 NOTE — Progress Notes (Signed)
Occupational Therapy Session Note  Patient Details  Name: Jacob Short MRN: [id number] Date of Birth: Jan 25, 1946  Today's Date: 06/23/2020 OT Individual Time: 1300-1415 OT Individual Time Calculation (min): 75 min    Short Term Goals: Week 1:  OT Short Term Goal 1 (Week 1): Pt will achieve 75 degrees Active abduction during dressing and bathing right shoulder. OT Short Term Goal 2 (Week 1): Pt will complete LB dressing with min assist. OT Short Term Goal 3 (Week 1): Pt will complete toilet transfer with min assist. OT Short Term Goal 4 (Week 1): Pt will complete LB bathing with min assist.  Skilled Therapeutic Interventions/Progress Updates:    Pt supine in bed, no c/o pain, agreeable to OT session.  Pt completed supine to sit with min assist and then mod assist intermittently to recover from posterior leaning.  Pt completed SPT EOB to w/c and completed UB/LB bathing and dressing at sink.  Pt required setup for UB dressing, min assist for UB bathing, min assist to donn/doff brief and shorts for threading over feet and pull over right hip.  Pt requesting to go to bathroom, transported via w/c to bathroom and pt completed SPT with min assist.  Continent bowel episode occurred.  Pericare and clothing management completed with CGA.  Neuro re-ed to RUE including right shoulder abduction, FF, ER/IR, elbow flex/ext, forearm pro/sup, wrist flex/ext. TCs provided to elicit correct muscle contraction, and manual support provided for completion of movement.  SPT w/c to EOB with min assist and sit to supine with CGA.  Call bell in reach, bed alarm on.     Therapy Documentation Precautions:  Precautions Precautions: Fall Precaution Comments: R hemiparesis UE>LE Restrictions Weight Bearing Restrictions: No   Therapy/Group: Individual Therapy  [name] 06/23/2020, 3:49 PM

## 2020-06-23 NOTE — Progress Notes (Signed)
Physical Therapy Session Note  Patient Details  Name: Jacob Short MRN: [id number] Date of Birth: Jun 16, 1946  Today's Date: 06/23/2020 PT Individual Time: 0800-0905 PT Individual Time Calculation (min): 65 min   Short Term Goals: Week 1:  PT Short Term Goal 1 (Week 1): Pt will perform bed mobility with minA PT Short Term Goal 2 (Week 1): Pt will perform bed<>chair transfer with minA and LRAD PT Short Term Goal 3 (Week 1): Pt will ambulate at least 59ft with minA and LRAD PT Short Term Goal 4 (Week 1): Pt will navigate up/down at least 4 steps with minA and LRAD  Skilled Therapeutic Interventions/Progress Updates:    Pt received supine in bed, awake and agreeable to PT session, denies any pain. Supine<>sit with minA towards L EOB with HOB slightly elevated, using bed rail and assist required for trunk control to upright. Stand<>pivot with minA towards weaker L side from EOB to w/c. Donned shoes with maxA for time management, WC transport to main therapy gym. Performed sit<>stand from w/c height with minA and stand<>pivot with minA to face mat table. Required min/modA to acquire tall knee position on mat table, using blue support table for BUE assist and mirror for visual feedback. Performed 2x10 tall kneeling to heel sitting, focusing on B hip extension posture. Also performed RUE extension WB on blue support table for proximal shoulder activation. He required modA for tall kneeling <>standing transition and minA for transfer back to w/c. After seated rest, performed gait training in hallway, 2x75ft with minA (progressing to [place] with fatigue) using L HR support. Continues to demo decrease L knee extension in stance with crouched gait pattern and R lateral lean. WC transport back to his room for time management and then pt requesting to use toilet for BM. Stand<>pivot with minA from w/c to toilet, required maxA for doffing/donning his pants and he was continent of BM. Sit<>stand with minA from  toilet (BSC over toilet) and required minA guard while he used LUE to perform pericare. Stand<>pivot with minA back to his w/c. Ended session seated in w/c with needs in reach, made comfortable.  Therapy Documentation Precautions:  Precautions Precautions: Fall Precaution Comments: R hemiparesis UE>LE Restrictions Weight Bearing Restrictions: No  Therapy/Group: Individual Therapy  Mikah Rottinghaus P Malaya Cagley PT 06/23/2020, 8:01 AM

## 2020-06-23 NOTE — Progress Notes (Signed)
Physical Therapy Session Note  Patient Details  Name: Jacob Short MRN: [id number] Date of Birth: [birth year]  Today's Date: 06/23/2020 PT Individual Time: 1003-1059 PT Individual Time Calculation (min): 56 min   Short Term Goals: Week 1:  PT Short Term Goal 1 (Week 1): Pt will perform bed mobility with minA PT Short Term Goal 2 (Week 1): Pt will perform bed<>chair transfer with minA and LRAD PT Short Term Goal 3 (Week 1): Pt will ambulate at least 11ft with minA and LRAD PT Short Term Goal 4 (Week 1): Pt will navigate up/down at least 4 steps with minA and LRAD  Skilled Therapeutic Interventions/Progress Updates: Pt presents sitting in w/c and agreeable to therapy.  Pt wheeled to gym for time conservation.  Pt performed multiple sit to stand transfers w/ min A for standing performance at [employer] w/ min to CGA.  Pt reaching forward and to bilateral sides w/ verbal cues for firing of R quads and abductors for improved posture.  Pt performed amb using L railing in hallway w/ min A and improved step through R LE and placement.  Pt amb w/ slightly flexed R knee, but w/ cueing to improve R knee extension, tends to fall into recurvatum.  Pt amb up to 30' forward and back.  As pt fatigues, LLE tends to come to center.  Pt requires verbal cues for posture and use of Rail for support, slowing gait so not grabbing it to catch self.  Pt performed sidestepping B directions at // bars w/ vermin A and verbal cues for foot clearance R w/ adduction.  Pt amb from doorway w/ HHA and min A to bed.  Pt required min A for RLE sit to supine.  Bed alarm on and all needs in reach.     Therapy Documentation Precautions:  Precautions Precautions: Fall Precaution Comments: R hemiparesis UE>LE Restrictions Weight Bearing Restrictions: No General:   Vital Signs:   Pain: no c/o pain.        Therapy/Group: Individual Therapy  Jacob Short 06/23/2020, 11:03 AM

## 2020-06-23 NOTE — Progress Notes (Signed)
[facility] PHYSICAL MEDICINE & REHABILITATION PROGRESS NOTE   Subjective/Complaints: Patient seen he states he did not don his WHO overnight.  ROS: Denies CP, SOB, N/V/D  Objective:   No results found. Recent Labs    06/21/20 0744  WBC 11.9*  HGB 13.6  HCT 41.4  PLT 210   Recent Labs    06/23/20 0613  NA 134*  K 4.3  CL 100  CO2 25  GLUCOSE 137*  BUN 11  CREATININE 0.69  CALCIUM 8.7*    Intake/Output Summary (Last 24 hours) at 06/23/2020 1209 Last data filed at 06/23/2020 0718 Gross per 24 hour  Intake 1180 ml  Output 300 ml  Net 880 ml        Physical Exam: Vital Signs Blood pressure 124/72, pulse 83, temperature 98.6 F (37 C), temperature source Oral, resp. rate 18, height 6\' 4"  (1.93 m), weight 84.5 kg, SpO2 98 %.  Constitutional: No distress . Vital signs reviewed. HENT: Normocephalic.  Atraumatic. Eyes: EOMI. No discharge. Cardiovascular: No JVD.  RRR. Respiratory: Normal effort.  No stridor.  Bilateral clear to auscultation. GI: Non-distended.  BS +. Skin: Warm and dry.  Intact. Psych: Normal mood.  Normal behavior. Musc: No edema in extremities.  No tenderness in extremities. Neuro: Alert Mild dysarthria, unchanged Fair awareness of deficits.  Right facial droop Motor: RUE: RUE 0/5 proximal distal, unchanged RLE: 4-4+/5 proximal to distal Left upper extremity/left lower extremities: 5/5 proximal to distal   Assessment/Plan: 1. Functional deficits secondary to left corona radiata infarct which require 3+ hours per day of interdisciplinary therapy in a comprehensive inpatient rehab setting.  Physiatrist is providing close team supervision and 24 hour management of active medical problems listed below.  Physiatrist and rehab team continue to assess barriers to discharge/monitor patient progress toward functional and medical goals  Care Tool:  Bathing              Bathing assist Assist Level: Moderate Assistance - Patient 50 - 74%      Upper Body Dressing/Undressing Upper body dressing   What is the patient wearing?: Hospital gown only    Upper body assist Assist Level: Minimal Assistance - Patient > 75%    Lower Body Dressing/Undressing Lower body dressing      What is the patient wearing?: Incontinence brief     Lower body assist Assist for lower body dressing: Minimal Assistance - Patient > 75%     Toileting Toileting    Toileting assist Assist for toileting: Moderate Assistance - Patient 50 - 74% Assistive Device Comment: bedpan   Transfers Chair/bed transfer  Transfers assist     Chair/bed transfer assist level: Minimal Assistance - Patient > 75%     Locomotion Ambulation   Ambulation assist      Assist level: Minimal Assistance - Patient > 75% Assistive device: Other (comment) (L  side rail.) Max distance: 30   Walk 10 feet activity   Assist     Assist level: Minimal Assistance - Patient > 75% Assistive device: Other (comment) (L side rail forward and back)   Walk 50 feet activity   Assist    Assist level: Moderate Assistance - Patient - 50 - 74% Assistive device: No Device, Other (comment) (L HR)    Walk 150 feet activity   Assist Walk 150 feet activity did not occur: Safety/medical concerns (fatigue after 48ft)  Assist level: Moderate Assistance - Patient - 50 - 74% Assistive device: No Device, Other (comment) (L HR)  Walk 10 feet on uneven surface  activity   Assist Walk 10 feet on uneven surfaces activity did not occur: Safety/medical concerns         Wheelchair     Assist Will patient use wheelchair at discharge?: No Type of Wheelchair: Manual    Wheelchair assist level: Minimal Assistance - Patient > 75% Max wheelchair distance: 40' using L extremities.    Wheelchair 50 feet with 2 turns activity    Assist        Assist Level: Minimal Assistance - Patient > 75%   Wheelchair 150 feet activity     Assist           Medical Problem List and Plan: 1. Right side hemiparesis secondary to left corona radiata infarct secondary small vessel disease  Continue CIR  WHO don nightly  2.  Antithrombotics: -DVT/anticoagulation: Lovenox.             -antiplatelet therapy: Current plan is for aspirin 81 mg daily and Brilinta 90 mg twice daily x30 days then transition back to aspirin and Plavix 3. Pain Management: Denies pain.  4. Mood: Provide emotional support             -antipsychotic agents: N/A 5. Neuropsych: This patient is capable of making decisions on his own behalf. 6. Skin/Wound Care: Routine skin checks 7. Fluids/Electrolytes/Nutrition: Routine in and outs. 8.  CAD with angioplasty.  Continue aspirin. 9.  Diabetes mellitus with peripheral neuropathy.  Hemoglobin A1c 6.9.  Glucophage 1000 mg at breakfast 1500 mg with supper.    Glipizide 2.5 started on 10/1  Monitor with increased mobility 10.  Hyperlipidemia.  Lipitor 11.  Tobacco abuse: Patient states that he has quit.  DCed nicotine patch. 12.  Hypertension.  Cozaar 25 mg daily.    Improving on 10/1  Monitor with increased mobility. 13.  History of AAA without rupture.  Follow-up outpatient monitoring.  14.  Hyponatremia  Sodium 134 on 10/1  Continue to monitor 15.  Hypoalbuminemia  Supplement initiated on 9/28 16.  Leukocytosis  WBCs 11.9 on 9/29, labs ordered for Monday  Afebrile  Continue to monitor  LOS: 4 days A FACE TO FACE EVALUATION WAS PERFORMED  Chiquita Heckert Lorie Phenix 06/23/2020, 12:09 PM

## 2020-06-24 ENCOUNTER — Inpatient Hospital Stay (HOSPITAL_COMMUNITY): Payer: PPO

## 2020-06-24 ENCOUNTER — Inpatient Hospital Stay (HOSPITAL_COMMUNITY): Payer: PPO | Admitting: Physical Therapy

## 2020-06-24 LAB — CBC WITH DIFFERENTIAL/PLATELET
Abs Immature Granulocytes: 0.04 10*3/uL (ref 0.00–0.07)
Basophils Absolute: 0 10*3/uL (ref 0.0–0.1)
Basophils Relative: 0 %
Eosinophils Absolute: 0.1 10*3/uL (ref 0.0–0.5)
Eosinophils Relative: 1 %
HCT: 40.3 % (ref 39.0–52.0)
Hemoglobin: 13 g/dL (ref 13.0–17.0)
Immature Granulocytes: 0 %
Lymphocytes Relative: 10 %
Lymphs Abs: 1 10*3/uL (ref 0.7–4.0)
MCH: 31.6 pg (ref 26.0–34.0)
MCHC: 32.3 g/dL (ref 30.0–36.0)
MCV: 98.1 fL (ref 80.0–100.0)
Monocytes Absolute: 1.4 10*3/uL — ABNORMAL HIGH (ref 0.1–1.0)
Monocytes Relative: 13 %
Neutro Abs: 8 10*3/uL — ABNORMAL HIGH (ref 1.7–7.7)
Neutrophils Relative %: 76 %
Platelets: 235 10*3/uL (ref 150–400)
RBC: 4.11 MIL/uL — ABNORMAL LOW (ref 4.22–5.81)
RDW: 13.6 % (ref 11.5–15.5)
WBC: 10.6 10*3/uL — ABNORMAL HIGH (ref 4.0–10.5)
nRBC: 0 % (ref 0.0–0.2)

## 2020-06-24 LAB — GLUCOSE, CAPILLARY
Glucose-Capillary: 116 mg/dL — ABNORMAL HIGH (ref 70–99)
Glucose-Capillary: 83 mg/dL (ref 70–99)
Glucose-Capillary: 89 mg/dL (ref 70–99)
Glucose-Capillary: 100 mg/dL — ABNORMAL HIGH (ref 70–99)

## 2020-06-24 NOTE — Progress Notes (Signed)
Physical Therapy Session Note  Patient Details  Name: Jacob Short MRN: [id number] Date of Birth: 09-22-1946  Today's Date: 06/24/2020 PT Individual Time: 0925-1020 and [id number] PT Individual Time Calculation (min): 55 min 45 min  Short Term Goals: Week 1:  PT Short Term Goal 1 (Week 1): Pt will perform bed mobility with minA PT Short Term Goal 2 (Week 1): Pt will perform bed<>chair transfer with minA and LRAD PT Short Term Goal 3 (Week 1): Pt will ambulate at least 109ft with minA and LRAD PT Short Term Goal 4 (Week 1): Pt will navigate up/down at least 4 steps with minA and LRAD  Skilled Therapeutic Interventions/Progress Updates:    Session 1: Pt received supine in bed and agreeable to therapy session. Supine>sitting L EOB, HOB partially elevated and relying heavily on L UE support via bedrail for trunk upright with CGA. Sitting EOB relying on L UE support on bedrail to prevent posteroir lean/LOB while therapist donned shoes max assist. R stand pivot EOB>w/c, no AD, with CGA/min assist for balance and cuing for upright trunk posture and increased hip/knee extension. Transported to/from gym in w/c for time management and energy conservation. Stand pivot w/c<>EOM, no AD, with CGA/min assist for balance. Sit>supine on mat with CGA for trunk descent and increased time for B LE management onto bed. Supine bridging x15 reps progressed to R LE bias x15 reps cuing for increased hip extension and glute activation on R. Rolling on mat with min assist primarily for R UE management. Sidelying L then sidelying R pelvic PNF in anterior elevation/posterior depression x20 reps each side focusing on improved pelvic mobility and core activation. Seated EOM carryover R pelvic PNF and trunk control task via lifting R hip up for therpist to repeatedly place/remove disc under R hip focusing on trunk dissassociaiton with L trunk elongation and R trunk shortening - pt required intermittent L UE support to prevent L  LOB - progressed ot L UE reaching to target to promote increased elongation on L with pt having difficulty shortening R trunk. Sitting in w/c>tall kneeling on mat table with B UE support on blue bench - mod assist for balance.  In tall kneeling, focused on R LE NMR and trunk control via LUE cross body reaching to place horseshoes on basketball goal - when reaching R required heavy mod assist to prevent R posterior lean/LOB with cuing and manual facilitation for increased R glute activation to return to midline and decreased compensation of pulling himself with L hand on bench. Transferred tall kneeling>sitting in w/c with +2 mod assist for safety as pt's LEs would stick to mat making it hard for him to bring them backwards and step off mat onto ground. Transported back to room and pt requesting to return to bed. Stand pivot w/c>EOB with min assist. Sit>supine with supervision using bedrails as needed. Pt left supine in bed with needs in reach and bed alarm on.  Session 2: Pt received sitting in w/c and agreeable to therapy session.  Transported to/from gym in w/c for time management and energy conservation. Gait training ~184ft using L hallway rail with min/mod assist for balance - continues to demo significant crouched posture with excessive trunk and bilateral hip/knee flexion, significantly decreased step lengths, decreased foot clearance (R more impaired), narrow BOS (R LE more adducted than L), and decreased gait speed. Stepped on/off treadmill using L UE support on bar with min assist for balance - donned litegait harness.  Performed the following locomotor treadmill training  trails using litegait harness for partial BWS and L UE support on bar (R hand ACE wrapped to bar on 2nd walk): - 1st trial: 37min26seconds at 0.63mph increased to 0.4mph totaling 249ft - starts with same gait mechanic impairments as noted overground - therapist provided external targets on R side to promote increased foot clearance  and step length with pt demoing some improvement but continues to have R knee flexed throughout stance with pt reporting he is trying to avoid hyperextension, cuing for increased extension and therapist guarding hyperextension with pt having a hard time being willing to attempt extension - cuing for L hip/knee extension as well as increased step length  - In between: performed R LE NMR focusing on stance phase control with increased hip/knee extension while avoiding hyperextension via stepping L LE forward/backwards to external targets - therapist facilitating R LE alignment and anterior weight shift/pelvic movement over R LE when stepping L foot forward - 2nd trial: 8min17seconds at 0.83mph increased to 0.2mph totaling ~361ft (machine erased numbers) - pt demos improving bilateral step lengths, improving hip/knee extension during stance though still in slight squat, and improving R LE foot clearance with intermittent foot catching - will need further NMR and gait training to continue advancing these improvements  Stepped off treadmill while in litegait harness for support/safety with min assist then doffed harness. Transported back to room and pt left seated in w/c with needs in reach and R UE supported on 1/2 lap tray.   Therapy Documentation Precautions:  Precautions Precautions: Fall Precaution Comments: R hemiparesis UE>LE Restrictions Weight Bearing Restrictions: No  Pain:   Session 1: Reports R knee pain when transitioning out of tall kneeling back to sitting in w/c due to R leg sticking to mat in an awkward alignment - reports pain stops once repositioned LE.  Session 2: Reports some hip discomfort during pre-gait training focusing on R LE NMR so limited repetitions for pain management otherwise no complaints of pain.   Therapy/Group: Individual Therapy  Tawana Scale , PT, DPT, CSRS  06/24/2020, 7:46 AM

## 2020-06-24 NOTE — Progress Notes (Signed)
[facility] PHYSICAL MEDICINE & REHABILITATION PROGRESS NOTE   Subjective/Complaints: No complaints this morning. Denies pain, constipation, insomnia.   ROS: Denies CP, SOB, N/V/D  Objective:   No results found. Recent Labs    06/24/20 0640  WBC 10.6*  HGB 13.0  HCT 40.3  PLT 235   Recent Labs    06/23/20 0613  NA 134*  K 4.3  CL 100  CO2 25  GLUCOSE 137*  BUN 11  CREATININE 0.69  CALCIUM 8.7*    Intake/Output Summary (Last 24 hours) at 06/24/2020 1059 Last data filed at 06/24/2020 5366 Gross per 24 hour  Intake 720 ml  Output 275 ml  Net 445 ml        Physical Exam: Vital Signs Blood pressure (!) 149/82, pulse 81, temperature 98 F (36.7 C), temperature source Oral, resp. rate 16, height 6\' 4"  (1.93 m), weight 85.9 kg, SpO2 98 %.    General: Alert and oriented x 3, No apparent distress HEENT: Head is normocephalic, atraumatic, PERRLA, EOMI, sclera anicteric, oral mucosa pink and moist, dentition intact, ext ear canals clear,  Neck: Supple without JVD or lymphadenopathy Heart: Reg rate and rhythm. No murmurs rubs or gallops Chest: CTA bilaterally without wheezes, rales, or rhonchi; no distress Abdomen: Soft, non-tender, non-distended, bowel sounds positive. Psych: Normal mood.  Normal behavior. Musc: No edema in extremities.  No tenderness in extremities. Neuro: Alert Mild dysarthria, unchanged Fair awareness of deficits.  Right facial droop Motor: RUE: RUE 0/5 proximal distal, unchanged RLE: 4-4+/5 proximal to distal Left upper extremity/left lower extremities: 5/5 proximal to distal   Assessment/Plan: 1. Functional deficits secondary to left corona radiata infarct which require 3+ hours per day of interdisciplinary therapy in a comprehensive inpatient rehab setting.  Physiatrist is providing close team supervision and 24 hour management of active medical problems listed below.  Physiatrist and rehab team continue to assess barriers to  discharge/monitor patient progress toward functional and medical goals  Care Tool:  Bathing    Body parts bathed by patient: Right arm, Chest, Abdomen, Front perineal area, Buttocks, Right upper leg, Left upper leg, Face   Body parts bathed by helper: Left arm     Bathing assist Assist Level: Minimal Assistance - Patient > 75%     Upper Body Dressing/Undressing Upper body dressing   What is the patient wearing?: Pull over shirt    Upper body assist Assist Level: Set up assist    Lower Body Dressing/Undressing Lower body dressing      What is the patient wearing?: Pants, Incontinence brief     Lower body assist Assist for lower body dressing: Moderate Assistance - Patient 50 - 74%     Toileting Toileting    Toileting assist Assist for toileting: Contact Guard/Touching assist Assistive Device Comment: bedpan   Transfers Chair/bed transfer  Transfers assist     Chair/bed transfer assist level: Minimal Assistance - Patient > 75%     Locomotion Ambulation   Ambulation assist      Assist level: Minimal Assistance - Patient > 75% Assistive device: Other (comment) (L  side rail.) Max distance: 30   Walk 10 feet activity   Assist     Assist level: Minimal Assistance - Patient > 75% Assistive device: Other (comment) (L side rail forward and back)   Walk 50 feet activity   Assist    Assist level: Moderate Assistance - Patient - 50 - 74% Assistive device: No Device, Other (comment) (L HR)  Walk 150 feet activity   Assist Walk 150 feet activity did not occur: Safety/medical concerns (fatigue after 43ft)  Assist level: Moderate Assistance - Patient - 50 - 74% Assistive device: No Device, Other (comment) (L HR)    Walk 10 feet on uneven surface  activity   Assist Walk 10 feet on uneven surfaces activity did not occur: Safety/medical concerns         Wheelchair     Assist Will patient use wheelchair at discharge?: No Type of  Wheelchair: Manual    Wheelchair assist level: Minimal Assistance - Patient > 75% Max wheelchair distance: 67' using L extremities.    Wheelchair 50 feet with 2 turns activity    Assist        Assist Level: Minimal Assistance - Patient > 75%   Wheelchair 150 feet activity     Assist          Medical Problem List and Plan: 1. Right side hemiparesis secondary to left corona radiata infarct secondary small vessel disease  Continue CIR  WHO don nightly 2.  Antithrombotics: -DVT/anticoagulation: Lovenox- d/c as ambulating >150 feet             -antiplatelet therapy: Current plan is for aspirin 81 mg daily and Brilinta 90 mg twice daily x30 days then transition back to aspirin and Plavix 3. Pain Management: Denies pain 4. Mood: Provide emotional support             -antipsychotic agents: N/A 5. Neuropsych: This patient is capable of making decisions on his own behalf. 6. Skin/Wound Care: Routine skin checks 7. Fluids/Electrolytes/Nutrition: Routine in and outs. 8.  CAD with angioplasty.  Continue aspirin. 9.  Diabetes mellitus with peripheral neuropathy.  Hemoglobin A1c 6.9.  Glucophage 1000 mg at breakfast 1500 mg with supper.    Glipizide 2.5 started on 10/1  10/2: better controlled  Monitor with increased mobility 10.  Hyperlipidemia.  Lipitor 11.  Tobacco abuse: Patient states that he has quit.  DCed nicotine patch. 12.  Hypertension.  Cozaar 25 mg daily.    Labile 10/2  Monitor with increased mobility. 13.  History of AAA without rupture.  Follow-up outpatient monitoring.  14.  Hyponatremia  Sodium 134 on 10/1  Continue to monitor 15.  Hypoalbuminemia  Supplement initiated on 9/28 16.  Leukocytosis  WBCs 11.9 on 9/29, labs ordered for Monday  Afebrile  Continue to monitor  LOS: 5 days A FACE TO FACE EVALUATION WAS PERFORMED  Martha Clan P Chennel Olivos 06/24/2020, 10:59 AM

## 2020-06-24 NOTE — Progress Notes (Signed)
Occupational Therapy Session Note  Patient Details  Name: Jacob Short MRN: [id number] Date of Birth: 07-19-1946  Today's Date: 06/24/2020 OT Individual Time: 0700-0800 and 1300-1330 OT Individual Time Calculation (min): 60 min and 30 min    Short Term Goals: Week 1:  OT Short Term Goal 1 (Week 1): Pt will achieve 75 degrees Active abduction during dressing and bathing right shoulder. OT Short Term Goal 2 (Week 1): Pt will complete LB dressing with min assist. OT Short Term Goal 3 (Week 1): Pt will complete toilet transfer with min assist. OT Short Term Goal 4 (Week 1): Pt will complete LB bathing with min assist.  Skilled Therapeutic Interventions/Progress Updates:    Session 1: OT session focused on ADL retraining, standing balance, and R- NMR. Pt received supine in bed requesting to change LB clothing only on this date. Completed supine>sit with min A, completing LB dressing mod assist overall and min A sit<>stand and standing balance. Transitioned to therapy gym then completed SPT w/c>mat table with min A. Engaged in functional reach task facilitating weight bearing through RUE 4x 10 reps. Transitioned to supine completing AAROM exercises against gravity and with gravity assisted. Exercises completed in supine to minimize compensatory movements with trunk. Completed 2 sets of 5 second shoulder flexion holds requiring mod assist for stabilization. Completed 2 sets x10 reps of elbow extension against gravity with mild resistance and controlled gravity assisted elbow flexion. Completed 2x 10 reps of abduction/adduction with OT providing assist to elevate UE from table. OT educated pt on ROM exercises to complete in room while providing foam block to increase grip strength. At end of session, pt returned to bed and left with all needs in reach.   Session 2: OT session focused on functional transfers and R-NMR. OT guided pt through UE ranger exercises focusing on strengthening shoulder/scapula  musculature as he completed 3x 12 reps of scapular retraction/protraction with min support. Completed 3x 12 sets of abduction/adduction providing min resistance with adduction and prompting for controlled movements. Pt verbalizing need to void, therefore transitioned to bathroom completing stand pivot transfer w/c<>toilet with min A. Pt required max A for toileting hygiene and clothing management, however sustained standing balance with CGA. At end of session, pt left sitting in w/c with all needs in reach.   Therapy Documentation Precautions:  Precautions Precautions: Fall Precaution Comments: R hemiparesis UE>LE Restrictions Weight Bearing Restrictions: No General:   Vital Signs:  Pain:   ADL: ADL Grooming: Setup Where Assessed-Grooming: Sitting at sink Upper Body Bathing: Minimal assistance Where Assessed-Upper Body Bathing: Sitting at sink Lower Body Bathing: Moderate assistance Where Assessed-Lower Body Bathing: Standing at sink, Sitting at sink Upper Body Dressing: Minimal assistance Where Assessed-Upper Body Dressing: Sitting at sink Lower Body Dressing: Moderate assistance Where Assessed-Lower Body Dressing: Standing at sink, Sitting at sink Toilet Transfer: Moderate assistance Toilet Transfer Method: Squat pivot Toilet Transfer Equipment: Drop arm bedside commode Tub/Shower Transfer: Not assessed Tub/Shower Transfer Method: Unable to assess Social research officer, government: Not assessed Vision   Perception    Praxis   Exercises:   Other Treatments:     Therapy/Group: Individual Therapy  Duayne Cal 06/24/2020, 7:57 AM

## 2020-06-25 LAB — GLUCOSE, CAPILLARY: Glucose-Capillary: 118 mg/dL — ABNORMAL HIGH (ref 70–99)

## 2020-06-25 MED ORDER — LOSARTAN POTASSIUM 50 MG PO TABS
50.0000 mg | ORAL_TABLET | Freq: Every day | ORAL | Status: DC
  Administered 2020-06-25 – 2020-06-30 (×6): 50 mg via ORAL
  Filled 2020-06-25 (×6): qty 1

## 2020-06-25 NOTE — Progress Notes (Signed)
[facility] PHYSICAL MEDICINE & REHABILITATION PROGRESS NOTE   Subjective/Complaints: No complaints this morning. Denies pain, constipation, insomnia.   ROS: Denies CP, SOB, N/V/D  Objective:   No results found. Recent Labs    06/24/20 0640  WBC 10.6*  HGB 13.0  HCT 40.3  PLT 235   Recent Labs    06/23/20 0613  NA 134*  K 4.3  CL 100  CO2 25  GLUCOSE 137*  BUN 11  CREATININE 0.69  CALCIUM 8.7*    Intake/Output Summary (Last 24 hours) at 06/25/2020 0908 Last data filed at 06/24/2020 2200 Gross per 24 hour  Intake 480 ml  Output 150 ml  Net 330 ml        Physical Exam: Vital Signs Blood pressure (!) 151/68, pulse 79, temperature 98.2 F (36.8 C), resp. rate 20, height 6\' 4"  (1.93 m), weight 83.9 kg, SpO2 98 %.  General: Alert and oriented x 3, No apparent distress HEENT: Head is normocephalic, atraumatic, PERRLA, EOMI, sclera anicteric, oral mucosa pink and moist, dentition intact, ext ear canals clear,  Neck: Supple without JVD or lymphadenopathy Heart: Reg rate and rhythm. No murmurs rubs or gallops Chest: CTA bilaterally without wheezes, rales, or rhonchi; no distress Abdomen: Soft, non-tender, non-distended, bowel sounds positive. Psych: Normal mood.  Normal behavior. Musc: No edema in extremities.  No tenderness in extremities. Neuro: Alert Mild dysarthria, unchanged Fair awareness of deficits.  Right facial droop Motor: RUE: RUE 0/5 proximal distal, unchanged RLE: 4-4+/5 proximal to distal Left upper extremity/left lower extremities: 5/5 proximal to distal    Assessment/Plan: 1. Functional deficits secondary to left corona radiata infarct which require 3+ hours per day of interdisciplinary therapy in a comprehensive inpatient rehab setting.  Physiatrist is providing close team supervision and 24 hour management of active medical problems listed below.  Physiatrist and rehab team continue to assess barriers to discharge/monitor patient progress  toward functional and medical goals  Care Tool:  Bathing    Body parts bathed by patient: Right arm, Chest, Abdomen, Front perineal area, Buttocks, Right upper leg, Left upper leg, Face   Body parts bathed by helper: Left arm     Bathing assist Assist Level: Minimal Assistance - Patient > 75%     Upper Body Dressing/Undressing Upper body dressing   What is the patient wearing?: Pull over shirt    Upper body assist Assist Level: Set up assist    Lower Body Dressing/Undressing Lower body dressing      What is the patient wearing?: Pants, Incontinence brief     Lower body assist Assist for lower body dressing: Moderate Assistance - Patient 50 - 74%     Toileting Toileting    Toileting assist Assist for toileting: Maximal Assistance - Patient 25 - 49% Assistive Device Comment: bedpan   Transfers Chair/bed transfer  Transfers assist     Chair/bed transfer assist level: Minimal Assistance - Patient > 75% Chair/bed transfer assistive device: Armrests   Locomotion Ambulation   Ambulation assist      Assist level: Dependent - Patient 0% Assistive device: Lite Gait Max distance: 377ft   Walk 10 feet activity   Assist     Assist level: Minimal Assistance - Patient > 75% Assistive device: Other (comment) (L side rail forward and back)   Walk 50 feet activity   Assist    Assist level: Moderate Assistance - Patient - 50 - 74% Assistive device: No Device, Other (comment) (L HR)    Walk 150 feet  activity   Assist Walk 150 feet activity did not occur: Safety/medical concerns (fatigue after 36ft)  Assist level: Moderate Assistance - Patient - 50 - 74% Assistive device: No Device, Other (comment) (L HR)    Walk 10 feet on uneven surface  activity   Assist Walk 10 feet on uneven surfaces activity did not occur: Safety/medical concerns         Wheelchair     Assist Will patient use wheelchair at discharge?: No Type of Wheelchair: Manual     Wheelchair assist level: Minimal Assistance - Patient > 75% Max wheelchair distance: 41' using L extremities.    Wheelchair 50 feet with 2 turns activity    Assist        Assist Level: Minimal Assistance - Patient > 75%   Wheelchair 150 feet activity     Assist          Medical Problem List and Plan: 1. Right side hemiparesis secondary to left corona radiata infarct secondary small vessel disease  Continue CIR  WHO don nightly 2.  Antithrombotics: -DVT/anticoagulation: Lovenox- d/c as ambulating >150 feet             -antiplatelet therapy: Current plan is for aspirin 81 mg daily and Brilinta 90 mg twice daily x30 days then transition back to aspirin and Plavix 3. Pain Management: Denies pain 4. Mood: Provide emotional support             -antipsychotic agents: N/A 5. Neuropsych: This patient is capable of making decisions on his own behalf. 6. Skin/Wound Care: Routine skin checks 7. Fluids/Electrolytes/Nutrition: Routine in and outs. 8.  CAD with angioplasty.  Continue aspirin. 9.  Diabetes mellitus with peripheral neuropathy.  Hemoglobin A1c 6.9.  Glucophage 1000 mg at breakfast 1500 mg with supper.    Glipizide 2.5 started on 10/1  10/2: better controlled  10/3: slightly elevated. Provided education regarding low sugar diet.   Monitor with increased mobility 10.  Hyperlipidemia.  Lipitor 11.  Tobacco abuse: Patient states that he has quit.  DCed nicotine patch. 12.  Hypertension.  Cozaar 25 mg daily.    10/3: elevated on last 5 reads. Discussed increasing Cozaar to 50mg .   Monitor with increased mobility. 13.  History of AAA without rupture.  Follow-up outpatient monitoring.  14.  Hyponatremia  Sodium 134 on 10/1  Continue to monitor 15.  Hypoalbuminemia  Supplement initiated on 9/28 16.  Leukocytosis  WBCs 11.9 on 9/29, labs ordered for Monday  Afebrile  Continue to monitor  LOS: 6 days A FACE TO FACE EVALUATION WAS PERFORMED  Talisa Petrak P  Jabes Primo 06/25/2020, 9:08 AM

## 2020-06-25 NOTE — Plan of Care (Signed)
°  Problem: Consults Goal: RH STROKE PATIENT EDUCATION Description: See Patient Education module for education specifics  Outcome: Progressing Goal: Diabetes Guidelines if Diabetic/Glucose > 140 Description: If diabetic or lab glucose is > 140 mg/dl - Initiate Diabetes/Hyperglycemia Guidelines & Document Interventions  Outcome: Progressing   Problem: RH BOWEL ELIMINATION Goal: RH STG MANAGE BOWEL WITH ASSISTANCE Description: STG Manage Bowel with Min Assistance. Pt will perform pericare and hygiene with min assistance. Will have normal bowel pattern. Outcome: Progressing Goal: RH STG MANAGE BOWEL W/MEDICATION W/ASSISTANCE Description: STG Manage Bowel with Medication with  [place]. Pt will be knowledgeable regarding factors involved with constipation and medications to prevent constipation.  Outcome: Progressing   Problem: RH BLADDER ELIMINATION Goal: RH STG MANAGE BLADDER WITH ASSISTANCE Description: STG Manage Bladder With  Min Assistance. Pt will be able to perform pericare and use equipment with min assist Outcome: Progressing Goal: RH STG MANAGE BLADDER WITH EQUIPMENT WITH ASSISTANCE Description: STG Manage Bladder With Equipment With Assistance Outcome: Progressing   Problem: RH SKIN INTEGRITY Goal: RH STG SKIN FREE OF INFECTION/BREAKDOWN Description: Pt will be knowledgeable of techniques to maintain skin integrity with minimal cues.  Outcome: Progressing Goal: RH STG MAINTAIN SKIN INTEGRITY WITH ASSISTANCE Description: STG Maintain Skin Integrity With min Assistance. Outcome: Progressing   Problem: RH SAFETY Goal: RH STG ADHERE TO SAFETY PRECAUTIONS W/ASSISTANCE/DEVICE Description: STG Adhere to Safety Precautions With  verbal cues  supervision /Devices per therapy Outcome: Progressing   Problem: RH PAIN MANAGEMENT Goal: RH STG PAIN MANAGED AT OR BELOW PT'S PAIN GOAL Description: Pt will be free of pain. Outcome: Progressing   Problem: RH KNOWLEDGE  DEFICIT Goal: RH STG INCREASE KNOWLEDGE OF DIABETES Description: Pt will be knowledgeable of disease process and will understand medications, use of equipment, dietary restrictions. Importance of compliance with verbal cues Outcome: Progressing Goal: RH STG INCREASE KNOWLEDGE OF HYPERTENSION Description: Pt will be knowledgeable regarding disease process medications and importance of compliance with verbal cue assist Outcome: Progressing Goal: RH STG INCREASE KNOWLEGDE OF HYPERLIPIDEMIA Description: Pt will be knowledgeable regarding disease process medical management with verbal cue assist Outcome: Progressing Goal: RH STG INCREASE KNOWLEDGE OF STROKE PROPHYLAXIS Description: Pt will be knowledgeable regarding disease process, prevention, medications, and importance of compliance. Knowledgeable of s/sx of stroke with verbal cues.   Outcome: Progressing

## 2020-06-26 ENCOUNTER — Inpatient Hospital Stay (HOSPITAL_COMMUNITY): Payer: PPO | Admitting: Occupational Therapy

## 2020-06-26 ENCOUNTER — Inpatient Hospital Stay (HOSPITAL_COMMUNITY): Payer: PPO

## 2020-06-26 LAB — GLUCOSE, CAPILLARY: Glucose-Capillary: 133 mg/dL — ABNORMAL HIGH (ref 70–99)

## 2020-06-26 MED ORDER — METHOCARBAMOL 500 MG PO TABS
500.0000 mg | ORAL_TABLET | Freq: Four times a day (QID) | ORAL | Status: DC | PRN
  Administered 2020-06-26 – 2020-07-09 (×14): 500 mg via ORAL
  Filled 2020-06-26 (×14): qty 1

## 2020-06-26 NOTE — Progress Notes (Signed)
Physical Therapy Session Note  Patient Details  Name: Jacob Short MRN: [id number] Date of Birth: October 30, 1945  Today's Date: 06/26/2020 PT Individual Time: 0900-1000 + 1300-1400 PT Individual Time Calculation (min): 60 min  + 60 min  Short Term Goals: Week 1:  PT Short Term Goal 1 (Week 1): Pt will perform bed mobility with minA PT Short Term Goal 2 (Week 1): Pt will perform bed<>chair transfer with minA and LRAD PT Short Term Goal 3 (Week 1): Pt will ambulate at least 56ft with minA and LRAD PT Short Term Goal 4 (Week 1): Pt will navigate up/down at least 4 steps with minA and LRAD  Skilled Therapeutic Interventions/Progress Updates:   1st session:   Pt received supine in bed, awake and agreeable to PT session, denies any pain but reports R foot "contractures" (muscle spasms) that occurred at night, otherwise no events. Supine<>sit with CGA with HOB elevated, using LUE to bed rail. Able to maintain sitting with CGA while unsupported. Stand<>pivot with CGA from raised EOB height to w/c, towards stronger L side. WC transport for time management to main hallway outside therapy gym. Performed gait training 2x260ft (seated rest break) with minA (regressing to light modA with fatigue) on level surfaces with intermittent use of L HR support. With gait, demo's improved initial R foot clearance and wide BOS however after ~153ft, this worsens although able to be corrected ~75% of the time with cues. Wheeled facing // bars to perform the following there-ex, focusing on standing balance, RLE hamstring activation, and RUE grip: -2x10 mini-squats with hand-over-hand assist for RUE grip to bar -2x10 standing RLE hamstring curls -1x15 sit<>stands focusing on hip extension and erect posture. He was able to briefly show the ability to maintain RUE grip to // bar during some of these there-ex!  WC transport back to his room where he remained sitting in w/c with needs in reach, pt pleased with progress.    2nd session: Pt received sitting in w/c, agreeable to PT session, denies any pain. Pt reporting need to void, therefore performed sit<>stand with minA from w/c to RW, he was able to maintain RUE grasp to RW (!) and therapist held urinal while pt voided in standing. MinA for stand<>sit for controlled lowering and then wheeled with totalA for time management to dayroom gym. Performed gait training ~53ft with minA and RW and then obstacle training while weaving in/out of cones with minA and RW. Pt demo's occasional scissoring with gait, narrow BOS, decreased R foot clearance, and intermittent R toe drag with decreased R knee extension in terminal stance. Pt then performed 3 trials of TUG with minA and RW, scoring: 65sec, 53 sec, and 52sec. Finished session with performing BUE there-ex with 2# dowel rod and RUE ace wrapped to assist with grip, performing the following while seated in w/c: 1x10 shldr press (AAROM) 1x10 chest press (AAROM) 1x10 bicep curls (AAROM) 1x5 with 5 sec isometric holds for shldr flexion at 90 deg  Pt transported back to his room in w/c, remained seated in chair with needs in reach.  Therapy Documentation Precautions:  Precautions Precautions: Fall Precaution Comments: R hemiparesis UE>LE Restrictions Weight Bearing Restrictions: No  Therapy/Group: Individual Therapy  Jacob Short PT 06/26/2020, 11:19 AM

## 2020-06-26 NOTE — Progress Notes (Signed)
Occupational Therapy Session Note  Patient Details  Name: Jacob Short MRN: [id number] Date of Birth: 08-28-1946  Today's Date: 06/26/2020 OT Individual Time:first session [phone]; second session: 1500-1540  OT Individual Time Calculation (min):  first session: 58 min ; second session: 40 min   Short Term Goals: Week 1:  OT Short Term Goal 1 (Week 1): Pt will achieve 75 degrees Active abduction during dressing and bathing right shoulder. OT Short Term Goal 2 (Week 1): Pt will complete LB dressing with min assist. OT Short Term Goal 3 (Week 1): Pt will complete toilet transfer with min assist. OT Short Term Goal 4 (Week 1): Pt will complete LB bathing with min assist.  Skilled Therapeutic Interventions/Progress Updates:    First session: Pt sitting up in w/c, reporting no pain, and noting increased use of RUE over the weekend.  Pt reports he took a full shower yesterday and requesting sinkside bathing this morning.  Pt self propelled to sink with supervision using BLE.  Pt doffed shirt with mod I and bathed UB with min assist for LUE.  Pt completed sit<>stand with min assist to block right knee upon initial sit to stand and CGA for remainder of standing and transfer. Pt bathed LB with CGA/supervision.  Pt pulled brief and pants over hips with CGA as well.    Pt self propelled to large gym using BLE with supervision.  Neuro re-ed completed sitting in front of mirror for visual feedback to improve motor learning while strengthening right shoulder.  Pt completed shoulder elevation, retraction, and scaption (using weightless dowel with coban securing right hand), and ER/IR, needing intermittent TCs for improved muscle recruitment throughout.  Completed 3 x 12 reps of each. OT transported pt back to room for time mgt.  Call bell in reach, nursing notified of pts location.    Second session: Pt supine in bed, c/o LLE spasms.  Nurse made aware and administered prescribed medication to address.  Pt  agreeable to neuro re-ed of LUE at bed level during OT session.  Pt completed biceps curls, triceps press both against gravity with cues to maintain eye contact with moving arm for motor feedback.  Pt completed forearm pro/sup, wrist flex/ext and RD/UD, digital flexion while gripping min resistive sponge.  Completed 3 x 12 reps of each.  NMES attended to right wrist and digital extensors to facilitate increased motor feedback and strengthening.  Improved strength noted in biceps, triceps, FA pronators, and digital flexors today.  Very limited forearm supination, wrist extension, and digital extension continues.  Call bell in reach, bed alarm on.  Therapy Documentation Precautions:  Precautions Precautions: Fall Precaution Comments: R hemiparesis UE>LE Restrictions Weight Bearing Restrictions: No   Therapy/Group: Individual Therapy  [name] 06/26/2020, 11:36 AM

## 2020-06-26 NOTE — Progress Notes (Signed)
[facility] PHYSICAL MEDICINE & REHABILITATION PROGRESS NOTE   Subjective/Complaints: Patient seen laying in bed this morning.  He states he slept well overnight.  He states he had a good weekend.  He notes right lower extremity spasms, but states he did not want any medication.  ROS: Denies CP, SOB, N/V/D  Objective:   No results found. Recent Labs    06/24/20 0640  WBC 10.6*  HGB 13.0  HCT 40.3  PLT 235   No results for input(s): NA, K, CL, CO2, GLUCOSE, BUN, CREATININE, CALCIUM in the last 72 hours.  Intake/Output Summary (Last 24 hours) at 06/26/2020 0946 Last data filed at 06/26/2020 0750 Gross per 24 hour  Intake 680 ml  Output 150 ml  Net 530 ml        Physical Exam: Vital Signs Blood pressure 129/65, pulse 77, temperature 98.4 F (36.9 C), temperature source Oral, resp. rate 19, height 6\' 4"  (1.93 m), weight 84.1 kg, SpO2 96 %.  Constitutional: No distress . Vital signs reviewed. HENT: Normocephalic.  Atraumatic. Eyes: EOMI. No discharge. Cardiovascular: No JVD.  RRR. Respiratory: Normal effort.  No stridor.  Bilateral clear to auscultation. GI: Non-distended.  BS +. Skin: Warm and dry.  Intact. Psych: Normal mood.  Normal behavior. Musc: No edema in extremities.  No tenderness in extremities. Neuro: Alert Mild dysarthria, stable Fair awareness of deficits.  Right facial droop Motor: RUE: RUE shoulder abduction 2 -/5, elbow flexion/extension 1+/5, distally 0/5  RLE: 4-4+/5 proximal to distal  Assessment/Plan: 1. Functional deficits secondary to left corona radiata infarct which require 3+ hours per day of interdisciplinary therapy in a comprehensive inpatient rehab setting.  Physiatrist is providing close team supervision and 24 hour management of active medical problems listed below.  Physiatrist and rehab team continue to assess barriers to discharge/monitor patient progress toward functional and medical goals  Care Tool:  Bathing    Body parts  bathed by patient: Right arm, Chest, Abdomen, Front perineal area, Buttocks, Right upper leg, Left upper leg, Face   Body parts bathed by helper: Left arm     Bathing assist Assist Level: Minimal Assistance - Patient > 75%     Upper Body Dressing/Undressing Upper body dressing   What is the patient wearing?: Pull over shirt    Upper body assist Assist Level: Set up assist    Lower Body Dressing/Undressing Lower body dressing      What is the patient wearing?: Pants, Incontinence brief     Lower body assist Assist for lower body dressing: Moderate Assistance - Patient 50 - 74%     Toileting Toileting    Toileting assist Assist for toileting: Maximal Assistance - Patient 25 - 49% Assistive Device Comment: bedpan   Transfers Chair/bed transfer  Transfers assist     Chair/bed transfer assist level: Minimal Assistance - Patient > 75% Chair/bed transfer assistive device: Armrests   Locomotion Ambulation   Ambulation assist      Assist level: Dependent - Patient 0% Assistive device: Lite Gait Max distance: 34ft   Walk 10 feet activity   Assist     Assist level: Minimal Assistance - Patient > 75% Assistive device: Other (comment) (L side rail forward and back)   Walk 50 feet activity   Assist    Assist level: Moderate Assistance - Patient - 50 - 74% Assistive device: No Device, Other (comment) (L HR)    Walk 150 feet activity   Assist Walk 150 feet activity did not occur: Safety/medical  concerns (fatigue after 72ft)  Assist level: Moderate Assistance - Patient - 50 - 74% Assistive device: No Device, Other (comment) (L HR)    Walk 10 feet on uneven surface  activity   Assist Walk 10 feet on uneven surfaces activity did not occur: Safety/medical concerns         Wheelchair     Assist Will patient use wheelchair at discharge?: No Type of Wheelchair: Manual    Wheelchair assist level: Minimal Assistance - Patient > 75% Max  wheelchair distance: 60' using L extremities.    Wheelchair 50 feet with 2 turns activity    Assist        Assist Level: Minimal Assistance - Patient > 75%   Wheelchair 150 feet activity     Assist          Medical Problem List and Plan: 1. Right side hemiparesis secondary to left corona radiata infarct secondary small vessel disease  Continue CIR  WHO don nightly 2.  Antithrombotics: -DVT/anticoagulation: Lovenox- d/c as ambulating >150 feet             -antiplatelet therapy: Current plan is for aspirin 81 mg daily and Brilinta 90 mg twice daily x30 days then transition back to aspirin and Plavix 3. Pain Management:   Patient does not want any medications for his right lower extremity spasms. 4. Mood: Provide emotional support             -antipsychotic agents: N/A 5. Neuropsych: This patient is capable of making decisions on his own behalf. 6. Skin/Wound Care: Routine skin checks 7. Fluids/Electrolytes/Nutrition: Routine in and outs. 8.  CAD with angioplasty.  Continue aspirin. 9.  Diabetes mellitus with peripheral neuropathy.  Hemoglobin A1c 6.9.  Glucophage 1000 mg at breakfast 1500 mg with supper.    Glipizide 2.5 started on 10/1  Elevated this a.m., however no recordings yesterday, will monitor for trend  Monitor with increased mobility 10.  Hyperlipidemia.  Lipitor 11.  Tobacco abuse: Patient states that he has quit.  DCed nicotine patch. 12.  Hypertension.  Cozaar 25 mg daily, increased to 50 on 10/3  Labile, but improving on 10/4  Monitor with increased mobility. 13.  History of AAA without rupture.  Follow-up outpatient monitoring.  14.  Hyponatremia  Sodium 134 on 10/1  Continue to monitor 15.  Hypoalbuminemia  Supplement initiated on 9/28 16.  Leukocytosis  WBCs 10.6 on 10/2  Afebrile  Continue to monitor  LOS: 7 days A FACE TO FACE EVALUATION WAS PERFORMED  Jarquez Mestre Lorie Phenix 06/26/2020, 9:46 AM

## 2020-06-26 NOTE — Plan of Care (Signed)
  Problem: Consults Goal: RH STROKE PATIENT EDUCATION Description: See Patient Education module for education specifics  Outcome: Progressing Goal: Diabetes Guidelines if Diabetic/Glucose > 140 Description: If diabetic or lab glucose is > 140 mg/dl - Initiate Diabetes/Hyperglycemia Guidelines & Document Interventions  Outcome: Progressing   Problem: RH BOWEL ELIMINATION Goal: RH STG MANAGE BOWEL WITH ASSISTANCE Description: STG Manage Bowel with Min Assistance. Pt will perform pericare and hygiene with min assistance. Will have normal bowel pattern. Outcome: Progressing Goal: RH STG MANAGE BOWEL W/MEDICATION W/ASSISTANCE Description: STG Manage Bowel with Medication with  [place]. Pt will be knowledgeable regarding factors involved with constipation and medications to prevent constipation.  Outcome: Progressing   Problem: RH BLADDER ELIMINATION Goal: RH STG MANAGE BLADDER WITH ASSISTANCE Description: STG Manage Bladder With  Min Assistance. Pt will be able to perform pericare and use equipment with min assist Outcome: Progressing Goal: RH STG MANAGE BLADDER WITH EQUIPMENT WITH ASSISTANCE Description: STG Manage Bladder With Equipment With Assistance Outcome: Progressing   Problem: RH SKIN INTEGRITY Goal: RH STG SKIN FREE OF INFECTION/BREAKDOWN Description: Pt will be knowledgeable of techniques to maintain skin integrity with minimal cues.  Outcome: Progressing Goal: RH STG MAINTAIN SKIN INTEGRITY WITH ASSISTANCE Description: STG Maintain Skin Integrity With min Assistance. Outcome: Progressing   Problem: RH SAFETY Goal: RH STG ADHERE TO SAFETY PRECAUTIONS W/ASSISTANCE/DEVICE Description: STG Adhere to Safety Precautions With  verbal cues  supervision /Devices per therapy Outcome: Progressing   Problem: RH PAIN MANAGEMENT Goal: RH STG PAIN MANAGED AT OR BELOW PT'S PAIN GOAL Description: Pt will be free of pain. Outcome: Progressing   Problem: RH KNOWLEDGE  DEFICIT Goal: RH STG INCREASE KNOWLEDGE OF DIABETES Description: Pt will be knowledgeable of disease process and will understand medications, use of equipment, dietary restrictions. Importance of compliance with verbal cues Outcome: Progressing Goal: RH STG INCREASE KNOWLEDGE OF HYPERTENSION Description: Pt will be knowledgeable regarding disease process medications and importance of compliance with verbal cue assist Outcome: Progressing Goal: RH STG INCREASE KNOWLEGDE OF HYPERLIPIDEMIA Description: Pt will be knowledgeable regarding disease process medical management with verbal cue assist Outcome: Progressing Goal: RH STG INCREASE KNOWLEDGE OF STROKE PROPHYLAXIS Description: Pt will be knowledgeable regarding disease process, prevention, medications, and importance of compliance. Knowledgeable of s/sx of stroke with verbal cues.   Outcome: Progressing

## 2020-06-27 ENCOUNTER — Inpatient Hospital Stay (HOSPITAL_COMMUNITY): Payer: PPO

## 2020-06-27 ENCOUNTER — Inpatient Hospital Stay (HOSPITAL_COMMUNITY): Payer: PPO | Admitting: Occupational Therapy

## 2020-06-27 DIAGNOSIS — M62838 Other muscle spasm: Secondary | ICD-10-CM

## 2020-06-27 LAB — GLUCOSE, CAPILLARY: Glucose-Capillary: 126 mg/dL — ABNORMAL HIGH (ref 70–99)

## 2020-06-27 MED ORDER — LIVING WELL WITH DIABETES BOOK
Freq: Once | Status: AC
  Filled 2020-06-27: qty 1

## 2020-06-27 NOTE — Plan of Care (Signed)
  Problem: Consults Goal: RH STROKE PATIENT EDUCATION Description: See Patient Education module for education specifics  Outcome: Progressing Goal: Diabetes Guidelines if Diabetic/Glucose > 140 Description: If diabetic or lab glucose is > 140 mg/dl - Initiate Diabetes/Hyperglycemia Guidelines & Document Interventions  Outcome: Progressing   Problem: RH BOWEL ELIMINATION Goal: RH STG MANAGE BOWEL WITH ASSISTANCE Description: STG Manage Bowel with Min Assistance. Pt will perform pericare and hygiene with min assistance. Will have normal bowel pattern. Outcome: Progressing Goal: RH STG MANAGE BOWEL W/MEDICATION W/ASSISTANCE Description: STG Manage Bowel with Medication with  Miles. Pt will be knowledgeable regarding factors involved with constipation and medications to prevent constipation.  Outcome: Progressing   Problem: RH BLADDER ELIMINATION Goal: RH STG MANAGE BLADDER WITH ASSISTANCE Description: STG Manage Bladder With  Min Assistance. Pt will be able to perform pericare and use equipment with min assist Outcome: Progressing Goal: RH STG MANAGE BLADDER WITH EQUIPMENT WITH ASSISTANCE Description: STG Manage Bladder With Equipment With Assistance Outcome: Progressing   Problem: RH SKIN INTEGRITY Goal: RH STG SKIN FREE OF INFECTION/BREAKDOWN Description: Pt will be knowledgeable of techniques to maintain skin integrity with minimal cues.  Outcome: Progressing Goal: RH STG MAINTAIN SKIN INTEGRITY WITH ASSISTANCE Description: STG Maintain Skin Integrity With min Assistance. Outcome: Progressing   Problem: RH SAFETY Goal: RH STG ADHERE TO SAFETY PRECAUTIONS W/ASSISTANCE/DEVICE Description: STG Adhere to Safety Precautions With  verbal cues  supervision /Devices per therapy Outcome: Progressing   Problem: RH PAIN MANAGEMENT Goal: RH STG PAIN MANAGED AT OR BELOW PT'S PAIN GOAL Description: Pt will be free of pain. Outcome: Progressing   Problem: RH KNOWLEDGE  DEFICIT Goal: RH STG INCREASE KNOWLEDGE OF DIABETES Description: Pt will be knowledgeable of disease process and will understand medications, use of equipment, dietary restrictions. Importance of compliance with verbal cues Outcome: Progressing Goal: RH STG INCREASE KNOWLEDGE OF HYPERTENSION Description: Pt will be knowledgeable regarding disease process medications and importance of compliance with verbal cue assist Outcome: Progressing Goal: RH STG INCREASE KNOWLEGDE OF HYPERLIPIDEMIA Description: Pt will be knowledgeable regarding disease process medical management with verbal cue assist Outcome: Progressing Goal: RH STG INCREASE KNOWLEDGE OF STROKE PROPHYLAXIS Description: Pt will be knowledgeable regarding disease process, prevention, medications, and importance of compliance. Knowledgeable of s/sx of stroke with verbal cues.   Outcome: Progressing

## 2020-06-27 NOTE — Progress Notes (Signed)
[facility] PHYSICAL MEDICINE & REHABILITATION PROGRESS NOTE   Subjective/Complaints: Patient seen ambulating and later sitting with therapies.  He notes he took medications for muscle spasms yesterday with good benefit. Discussed improvement in strength with patient and therapies.   ROS: Denies CP, SOB, N/V/D  Objective:   No results found. No results for input(s): WBC, HGB, HCT, PLT in the last 72 hours. No results for input(s): NA, K, CL, CO2, GLUCOSE, BUN, CREATININE, CALCIUM in the last 72 hours.  Intake/Output Summary (Last 24 hours) at 06/27/2020 1030 Last data filed at 06/27/2020 0800 Gross per 24 hour  Intake 800 ml  Output 400 ml  Net 400 ml        Physical Exam: Vital Signs Blood pressure 132/68, pulse 76, temperature 98.5 F (36.9 C), resp. rate 16, height 6\' 4"  (1.93 m), weight 84.8 kg, SpO2 97 %.  Constitutional: No distress . Vital signs reviewed. HENT: Normocephalic.  Atraumatic. Eyes: EOMI. No discharge. Cardiovascular: No JVD.  RRR. Respiratory: Normal effort.  No stridor.  Bilateral clear to auscultation. GI: Non-distended.  BS +. Skin: Warm and dry.  Intact. Psych: Normal mood.  Normal behavior. Musc: No edema in extremities.  No tenderness in extremities. Neuro: Alert Mild dysarthria, unchanged Fair awareness of deficits.  Right facial droop, stable Motor: RUE: RUE shoulder abduction 3/5, elbow flexion/extension 2/5, distally 0/5  RLE: 4-4+/5 proximal to distal  Assessment/Plan: 1. Functional deficits secondary to left corona radiata infarct which require 3+ hours per day of interdisciplinary therapy in a comprehensive inpatient rehab setting.  Physiatrist is providing close team supervision and 24 hour management of active medical problems listed below.  Physiatrist and rehab team continue to assess barriers to discharge/monitor patient progress toward functional and medical goals  Care Tool:  Bathing    Body parts bathed by patient: Right  arm, Chest, Abdomen, Front perineal area, Buttocks, Right upper leg, Left upper leg, Face   Body parts bathed by helper: Left arm Body parts n/a: Right lower leg, Left lower leg   Bathing assist Assist Level: Minimal Assistance - Patient > 75%     Upper Body Dressing/Undressing Upper body dressing   What is the patient wearing?: Pull over shirt    Upper body assist Assist Level: Set up assist    Lower Body Dressing/Undressing Lower body dressing      What is the patient wearing?: Pants, Incontinence brief     Lower body assist Assist for lower body dressing: Minimal Assistance - Patient > 75%     Toileting Toileting    Toileting assist Assist for toileting: Maximal Assistance - Patient 25 - 49% Assistive Device Comment: bedpan   Transfers Chair/bed transfer  Transfers assist     Chair/bed transfer assist level: Minimal Assistance - Patient > 75% Chair/bed transfer assistive device: Armrests   Locomotion Ambulation   Ambulation assist      Assist level: Dependent - Patient 0% Assistive device: Lite Gait Max distance: 331ft   Walk 10 feet activity   Assist     Assist level: Minimal Assistance - Patient > 75% Assistive device: Other (comment) (L side rail forward and back)   Walk 50 feet activity   Assist    Assist level: Moderate Assistance - Patient - 50 - 74% Assistive device: No Device, Other (comment) (L HR)    Walk 150 feet activity   Assist Walk 150 feet activity did not occur: Safety/medical concerns (fatigue after 33ft)  Assist level: Moderate Assistance - Patient -  50 - 74% Assistive device: No Device, Other (comment) (L HR)    Walk 10 feet on uneven surface  activity   Assist Walk 10 feet on uneven surfaces activity did not occur: Safety/medical concerns         Wheelchair     Assist Will patient use wheelchair at discharge?: No Type of Wheelchair: Manual    Wheelchair assist level: Minimal Assistance - Patient >  75% Max wheelchair distance: 59' using L extremities.    Wheelchair 50 feet with 2 turns activity    Assist        Assist Level: Minimal Assistance - Patient > 75%   Wheelchair 150 feet activity     Assist          Medical Problem List and Plan: 1. Right side hemiparesis secondary to left corona radiata infarct secondary small vessel disease  Continue CIR  WHO don nightly 2.  Antithrombotics: -DVT/anticoagulation: Lovenox- d/c as ambulating >150 feet             -antiplatelet therapy: Current plan is for aspirin 81 mg daily and Brilinta 90 mg twice daily x30 days then transition back to aspirin and Plavix 3. Pain Management:   Robaxin 500 q6 PRN with good benefit 4. Mood: Provide emotional support             -antipsychotic agents: N/A 5. Neuropsych: This patient is capable of making decisions on his own behalf. 6. Skin/Wound Care: Routine skin checks 7. Fluids/Electrolytes/Nutrition: Routine in and outs. 8.  CAD with angioplasty.  Continue aspirin. 9.  Diabetes mellitus with peripheral neuropathy.  Hemoglobin A1c 6.9.  Glucophage 1000 mg at breakfast 1500 mg with supper.    Glipizide 2.5 started on 10/1  Mildly elevated on 10/5  Monitor with increased mobility 10.  Hyperlipidemia.  Lipitor 11.  Tobacco abuse: Patient states that he has quit.  DCed nicotine patch. 12.  Hypertension.  Cozaar 25 mg daily, increased to 50 on 10/3  Relatively controlled on 10/5  Monitor with increased mobility. 13.  History of AAA without rupture.  Follow-up outpatient monitoring.  14.  Hyponatremia  Sodium 134 on 10/1, labs ordered for tomorrow  Continue to monitor 15.  Hypoalbuminemia  Supplement initiated on 9/28 16.  Leukocytosis  WBCs 10.6 on 10/2  Afebrile  Continue to monitor  LOS: 8 days A FACE TO FACE EVALUATION WAS PERFORMED  Jacob Short Lorie Phenix 06/27/2020, 10:30 AM

## 2020-06-27 NOTE — Progress Notes (Signed)
Physical Therapy Weekly Progress Note  Patient Details  Name: Jacob Short MRN: [id number] Date of Birth: 1946-07-10  Beginning of progress report period: June 20, 2020 End of progress report period: June 27, 2020  Today's Date: 06/27/2020 PT Individual Time: 0800-0915 PT Individual Time Calculation (min): 75 min   Patient has met 4 of 4 short term goals.  Pt is making appropriate progress towards his goals, continues to be very motivated and participatory during our sessions. He has demonstrated improvement in functional mobility with his ability to perform bed mobility with minA, transfers with minA and no AD, and has progressed to ambulating variable distances with minA and RW. Improvement in RUE grip allowing his to trial RW for gait progressions to improve stability. He continues to be primarily limited by RUE > RLE weakness, RLE ataxia, decreased motor control/planning, and gait deficits impacting his functional mobility.  Patient continues to demonstrate the following deficits muscle weakness and impaired timing and sequencing, unbalanced muscle activation, ataxia, decreased coordination and decreased motor planning and therefore will continue to benefit from skilled PT intervention to increase functional independence with mobility.  Patient progressing toward long term goals.  Continue plan of care.  PT Short Term Goals Week 1:  PT Short Term Goal 1 (Week 1): Pt will perform bed mobility with minA PT Short Term Goal 2 (Week 1): Pt will perform bed<>chair transfer with minA and LRAD PT Short Term Goal 3 (Week 1): Pt will ambulate at least 31f with minA and LRAD PT Short Term Goal 4 (Week 1): Pt will navigate up/down at least 4 steps with minA and LRAD Week 2:  PT Short Term Goal 1 (Week 2): Pt will perform bed mobility with CGA PT Short Term Goal 2 (Week 2): Pt will perform bed<>chair transfer with CGA and LRAD PT Short Term Goal 3 (Week 2): Pt will ambulate at least 1024f with CGA and LRAD PT Short Term Goal 4 (Week 2): Pt will navigate up.down x4 steps with CGA and LRAD  Skilled Therapeutic Interventions/Progress Updates:    Pt received supine in bed, agreeable to PT session, denies any pain other than mild L heel numbness which later improves during our session with mobility. Supine<>sit with minA towards L EOB using bed rail, forward scooting with CGA at EOB. Stand<>pivot with minA and no AD towards stronger R side from EOB to w/c. Donned shoes with maxA for time management. WC transport for time management and energy conservation to hallway outside main therapy gym. Gait training 2x20064fseated rest break) with mostly minA and RW (regressing to modDuffieldth last ~[id number] for RW management and truncal stability while using tile line to assist with visual feedback for widening BOS. Pt then performed stair training on 6inch steps, negotiated up/down x4 steps with minA and B HR support (required assist for advancing RUE on rail) with cues for sequencing and technique. After seated rest break, performed again using only R HR as this is what he has at home, he required modA for completing this due to significant unsteadiness and difficulty with advancing RLE up steps as well as maintaining adequate RUE grip to HR. Discussed alternative options for stair negotiation such as lateral stepping or the possibility of having a L HR installed at his home. Pt's son is a contChief Strategy Officer pt reports he can likely have him install this at home. Pt then performed car transfer with minA and RW, however, due to his height (6'4), required assist for BLE  management into the car simulator. Discussed with him using his car features (newer model of [employer]) for adjusting seat length/position to assist with feet clearance. Pt then ambulated up/down 106f ramp with minA and RW, increased difficulty with descent > ascent for RW management. Pt completed seated there-ex for RHB strengthening: -1x15  shldr press with weightless dowel rod (RUE ace wrapped to rod) -1x15 chest press with weightless dowel rod (RUE ace wrapped to rod) -1x15 forearm curls with weightless dowel rod (RUE ace wrapped to rod) -1x10 with 5 second isometric holds with weightless dowel rod (RUE ace wrapped to rod) -1x15 RLE LAQ with 3# ankle weight -1x15 RLE hip marches with 3# ankle weight -1x10 RLE knee ext isometric holds with 3# ankle weight.  Returned pt back to his room where he remained seated in w/c with needs in reach, made comfortable. Pt continues to be pleased with progress.  Therapy Documentation Precautions:  Precautions Precautions: Fall Precaution Comments: R hemiparesis UE>LE Restrictions Weight Bearing Restrictions: No  Therapy/Group: Individual Therapy  Kyliah Deanda P Chinyere Galiano PT 06/27/2020, 7:34 AM

## 2020-06-27 NOTE — Progress Notes (Signed)
Occupational Therapy Session Note  Patient Details  Name: Jacob Short MRN: [id number] Date of Birth: 05-Jan-1946  Today's Date: 06/27/2020 OT Individual Time: 0958-1100 OT Individual Time Calculation (min): 62 min    Short Term Goals: Week 1:  OT Short Term Goal 1 (Week 1): Pt will achieve 75 degrees Active abduction during dressing and bathing right shoulder. OT Short Term Goal 2 (Week 1): Pt will complete LB dressing with min assist. OT Short Term Goal 3 (Week 1): Pt will complete toilet transfer with min assist. OT Short Term Goal 4 (Week 1): Pt will complete LB bathing with min assist.  Skilled Therapeutic Interventions/Progress Updates:    Pt sitting up in w/c, requesting to strengthen arm this AM and complete self care during afternoon session.  No reports of pain.  Pt transported to ortho gym for time mgt.  SPT w/c to nustep chair with CGA.  Pt completed BUE and BLE propulsion on Nustep with right hand secured using ace wrap on level 3 x 8 minutes.  SPT nustep chair to w/c with CGA.  Neuro re-ed completed RUE in large gym including attended NMES to wrist extensors (isolated with digits cobanned into flexion) and digital extensors.  Pt cue'd to use vision for motor feedback and to attempt AROM each interval of NMES stimulation.  Pt completed RUE A/AAROM in front of mirror for visual feedback to complete reach and tap sh FF combined with elbow extension, and ER combined with isometric elbow flexion.  Min manual support at wrist to maintain neutral position needed.  Pt completed elbow flexion isolations using weightless dowel and ace wrap to secure hand.  3 x 12 reps of each completed.  Pt returned to room, and requesting back to bed. SPT with CGA and supervision sit to supine.  Call bell in reach, bed alarm on.  Therapy Documentation Precautions:  Precautions Precautions: Fall Precaution Comments: R hemiparesis UE>LE Restrictions Weight Bearing Restrictions: No   Therapy/Group:  Individual Therapy  [name] 06/27/2020, 4:20 PM

## 2020-06-27 NOTE — Progress Notes (Signed)
Occupational Therapy Session Note  Patient Details  Name: ABDULKAREEM BADOLATO MRN: [id number] Date of Birth: 1946/09/04  Today's Date: 06/27/2020 OT Individual Time: 1400-1500 OT Individual Time Calculation (min): 60 min    Short Term Goals: Week 1:  OT Short Term Goal 1 (Week 1): Pt will achieve 75 degrees Active abduction during dressing and bathing right shoulder. OT Short Term Goal 2 (Week 1): Pt will complete LB dressing with min assist. OT Short Term Goal 3 (Week 1): Pt will complete toilet transfer with min assist. OT Short Term Goal 4 (Week 1): Pt will complete LB bathing with min assist.  Skilled Therapeutic Interventions/Progress Updates:    Patient in bed, alert and ready for afternoon session.  Supine to sitting edge of bed CS, min cues for technique.  Bed linens and brief wet with urine and changed.   Sit to stand and ambulation with RW to/from bed, shower bench, w/c with CG/min A.  Patient able to doff clothing with min A seated on shower bench.  Shower completed with long handled bath sponge, hand held shower and grab bars min/mod A overall for thoroughness with left UE, bilateral lower legs and buttocks.  Dressing completed seated in arm chair - OH shirt set up, incontinence brief mod A, shorts min A, CM in stance with min a, max A slipper socks and shoes.  SPT to/from mat table in gym with min A.  Completed right UE weight bearing and motor control exercises in unsupported sitting and supine positions with focus on posture, proximal control/symmetry and reduction of compensatory motion.  He remained seated in w/c at close of session with seat belt alarm set and call bell in hand.    Therapy Documentation Precautions:  Precautions Precautions: Fall Precaution Comments: R hemiparesis UE>LE Restrictions Weight Bearing Restrictions: No  Therapy/Group: Individual Therapy  Carlos Levering 06/27/2020, 7:41 AM

## 2020-06-27 NOTE — Progress Notes (Signed)
Patient ID: Jacob Short, male   DOB: April 29, 1946, 74 y.o.   MRN: [id number] Patient given information on secondary stroke prevention and risk factors including CAD, DM, HLD and smoking per nurse Benjie Karvonen. Reviewed handouts on CAD in males, CMM diet, triglyceride eating plan, health risk for smoking and smoking cessation information. No other questions or concerns noted at present. Margarito Liner

## 2020-06-28 ENCOUNTER — Encounter (HOSPITAL_COMMUNITY): Payer: PPO | Admitting: Psychology

## 2020-06-28 ENCOUNTER — Inpatient Hospital Stay (HOSPITAL_COMMUNITY): Payer: PPO | Admitting: Occupational Therapy

## 2020-06-28 ENCOUNTER — Inpatient Hospital Stay (HOSPITAL_COMMUNITY): Payer: PPO

## 2020-06-28 LAB — BASIC METABOLIC PANEL
Anion gap: 13 (ref 5–15)
BUN: 7 mg/dL — ABNORMAL LOW (ref 8–23)
CO2: 24 mmol/L (ref 22–32)
Calcium: 9 mg/dL (ref 8.9–10.3)
Chloride: 103 mmol/L (ref 98–111)
Creatinine, Ser: 0.88 mg/dL (ref 0.61–1.24)
GFR calc non Af Amer: >60 mL/min (ref >60)
Glucose, Bld: 127 mg/dL — ABNORMAL HIGH (ref 70–99)
Potassium: 3.9 mmol/L (ref 3.5–5.1)
Sodium: 140 mmol/L (ref 135–145)

## 2020-06-28 LAB — GLUCOSE, CAPILLARY
Glucose-Capillary: 111 mg/dL — ABNORMAL HIGH (ref 70–99)
Glucose-Capillary: 106 mg/dL — ABNORMAL HIGH (ref 70–99)

## 2020-06-28 NOTE — Patient Care Conference (Signed)
Inpatient RehabilitationTeam Conference and Plan of Care Update Date: 06/28/2020   Time: 11:15 AM    Patient Name: Jacob Short      Medical Record Number: [id number]  Date of Birth: 01-16-1946 Sex: Male         Room/Bed: 4M05C/4M05C-01 Payor Info: Payor: [employer] / Plan: [employer] PPO / Product Type: *No Product type* /    Admit Date/Time:  06/19/2020  5:22 PM  Primary Diagnosis:  Small vessel disease, cerebrovascular  Short Problems: Principal Problem:   Small vessel disease, cerebrovascular Active Problems:   Leukocytosis   Hypoalbuminemia due to protein-calorie malnutrition (HCC)   Hyponatremia   Essential hypertension   Diabetic peripheral neuropathy (HCC)   Right hemiparesis (HCC)   Muscle spasm    Expected Discharge Date: Expected Discharge Date: 07/11/20  Team Members Present: Physician leading conference: Dr. Leeroy Cha Care Coodinator Present: Dorien Chihuahua, RN, BSN, CRRN;Other (comment) Jacqlyn Larsen Dupree, SW) Nurse Present: Rayne Du, LPN PT Present: Other (comment) (Christain Manhard, PT) OT Present: Leretha Pol, OT PPS Coordinator present : Gunnar Fusi, SLP     Current Status/Progress Goal Weekly Team Focus  Bowel/Bladder   Contient/incontinent; Last BM 10/5  Become continent x2  Assess every shit and as needed   Swallow/Nutrition/ Hydration             ADL's   CGA functional transfers, min assist LB ADLs, setup/supervision UB ADLs, steady return RUE proximally/flexors returning distally  supervision  self care/compensatory training, functional transfer training, NMR and strengthening RUE   Mobility   minA bed mobility, minA transfers, gait ~192ft minA and RW, 4 stairs minA and B HR  supervision  L HB NMR, gait with LRAD, stair training   Communication             Safety/Cognition/ Behavioral Observations            Pain   No pain reported during shift  Pain <3/10  Assess every shift and as needed   Skin    Bruising left arm, skin otherwise intact  Prevent further breakdown  Assess every shift and as needed     Discharge Planning:  Wife to take of time from work to provide the 24/7 supervision he will need. Will ned to let know when to come in for family education   Team Discussion: BP issues monitored. Incontinent of B+B. Note right LE buckling and right lateral lean. Patient is very motivated to progress. Patient on target to meet rehab goals: yes, currently ambulating 150' with RW and min assist  *See Care Plan and progress notes for long and short-term goals.   Revisions to Treatment Plan:  Practice with walker due to right hand grip improvement and neuromuscular therapy and strengthening exercises for UE Teaching Needs: Transfers, toileting, medications, etc.  Current Barriers to Discharge: Decreased caregiver support  Possible Resolutions to Barriers: Family education     Medical Summary Current Status: Incontinent of bladder, HTN, no pain, hemiparesis, CBGs slightly elevated  Barriers to Discharge: Medical stability;Decreased family/caregiver support  Barriers to Discharge Comments: Incontinent of bladder, HTN, no pain, will need assistance from family upon discharge, hemiparesis, CBGS slightly elevated Possible Resolutions to [employer] Focus: Bladder program, monitor BP TID, caregiver training, wrist splint, monitor CBGs AC/HS   Continued Need for Acute Rehabilitation Level of Care: The patient requires daily medical management by a physician with specialized training in physical medicine and rehabilitation for the following reasons: Direction of a multidisciplinary physical rehabilitation program to  maximize functional independence : Yes Medical management of patient stability for increased activity during participation in an intensive rehabilitation regime.: Yes Analysis of laboratory values and/or radiology reports with any subsequent need for medication adjustment  and/or medical intervention. : Yes   I attest that I was present, lead the team conference, and concur with the assessment and plan of the team.   Dorien Chihuahua B 06/28/2020, 2:09 PM

## 2020-06-28 NOTE — Progress Notes (Signed)
Patient ID: Jacob Short, male   DOB: [birth year], 74 y.o.   MRN: [id number]  Met with pt to discuss team conference progress toward his goals and discharge still 10/19. Discussed having wife come in for education next week. He will talk with and get back with this worker for next week.

## 2020-06-28 NOTE — Progress Notes (Signed)
Physical Therapy Session Note  Patient Details  Name: Jacob Short MRN: [id number] Date of Birth: 1945-11-23  Today's Date: 06/28/2020 PT Individual Time: 1415-1525 PT Individual Time Calculation (min): 70 min   Short Term Goals: Week 2:  PT Short Term Goal 1 (Week 2): Pt will perform bed mobility with CGA PT Short Term Goal 2 (Week 2): Pt will perform bed<>chair transfer with CGA and LRAD PT Short Term Goal 3 (Week 2): Pt will ambulate at least 148ft with CGA and LRAD PT Short Term Goal 4 (Week 2): Pt will navigate up.down x4 steps with CGA and LRAD  Skilled Therapeutic Interventions/Progress Updates:    Pt received supine in bed, agreeable to PT session; denies any pain. Supine<>sit with supervision using bed rail. Stand<>pivot with CGA from EOB to w/c towards weaker L side. He self propelled himself using BLE's only from his room to main therapy gym, focusing on hamstring activation (RLE>LLE). Performed gait training in hallway with minA and RW, ambulated ~23ft on level surfaces. Cues for increasing R heel strike, widening BOS, and RW management. Stand<>pivot with CGA and no AD from w/c to mat table. Performed 2x10 sit<>stands with CGA from lowered mat table focusing on R knee control and eccentric lowering. Performed the following there-ex at // bars while facing mirror for visual feedback for NMR: -2x10 RLE hip abduction -2x10 LLE hip abduction -2x10 RLE hamstring curls -2x10 LLE hamstring curls -2x10 mini-squats -3x10 modified wall-push up.  He required x2 seated rest breaks for completion of above there-ex. WC transport outside near [facility] building to complete further NMR for RUE weakness while seated in w/c: -1x10 FA supination/pronation (AAROM for end range) -1x10 bicep curls (AAROM for end range) -1x10 shldr flex to 90 deg (AAROM for end range) -1x10 shldr abd to 90 deg (AAROM for end range) -1x10 wrist ext (AAROM, mostly PROM with brachioradialis compensation) -1x10 digit  flex -2x10 w/c push ups  WC transport back to his room for time management, stand<>pivot transfer with CGA from w/c to EOB. Required minA for sit>supine for RLE management. Ended session semi-reclined in bed, bed alarm on, 3/4 rails up, needs in reach. Pt continues to be pleased with progress.  Therapy Documentation Precautions:  Precautions Precautions: Fall Precaution Comments: R hemiparesis UE>LE Restrictions Weight Bearing Restrictions: No  Therapy/Group: Individual Therapy  Buena Boehm P Chizara Mena PT 06/28/2020, 3:33 PM

## 2020-06-28 NOTE — Progress Notes (Signed)
Occupational Therapy Session Note  Patient Details  Name: Jacob Short MRN: [id number] Date of Birth: [birth year]  Today's Date: 06/28/2020 OT Individual Time: 1000-1100 OT Individual Time Calculation (min): 60 min    Short Term Goals: Week 1:  OT Short Term Goal 1 (Week 1): Pt will achieve 75 degrees Active abduction during dressing and bathing right shoulder. OT Short Term Goal 2 (Week 1): Pt will complete LB dressing with min assist. OT Short Term Goal 3 (Week 1): Pt will complete toilet transfer with min assist. OT Short Term Goal 4 (Week 1): Pt will complete LB bathing with min assist.  Skilled Therapeutic Interventions/Progress Updates:    Pt sitting up in w/c, reports he just showered yesterday afternoon, and would like to focus session on RUE strengthening.  Pt transported to large gym for time mgt.  Pt completed scapular elevation and retraction, sh scaption, sh ER/IR, and extension (using min resist tband), elbow flexion against gravity, elbow extension with min manual resistance provided by OT, FA pronation, wrist flexion, digital flexion.  Mirror placed in front of pt for improved visual feedback to assist with motor learning.  TCs provided intermittently to recruit correct musculature.  Pt completed 3 x 15 reps of each.  Pt exhibited increased biceps and wrist flexor strength.  No muscle activation noted FA supination, wrist extensors, or digital extensors therefore utilized NMES attended to facilitate muscle recruitment and motor learning/strengthening first isolating wrist extension with digits cobanned in flexion, and then digital extension with wrist in neutral.  Pt returned to room, call bell in reach, seat belt alarm on.   Therapy Documentation Precautions:  Precautions Precautions: Fall Precaution Comments: R hemiparesis UE>LE Restrictions Weight Bearing Restrictions: No   Therapy/Group: Individual Therapy  Jacob Short 06/28/2020, 11:21 AM

## 2020-06-28 NOTE — Progress Notes (Signed)
Physical Therapy Session Note  Patient Details  Name: Jacob Short MRN: [id number] Date of Birth: September 21, 1946  Today's Date: 06/28/2020 PT Individual Time: 0800-0915 PT Individual Time Calculation (min): 75 min   Short Term Goals: Week 1:  PT Short Term Goal 1 (Week 1): Pt will perform bed mobility with minA PT Short Term Goal 2 (Week 1): Pt will perform bed<>chair transfer with minA and LRAD PT Short Term Goal 3 (Week 1): Pt will ambulate at least 55ft with minA and LRAD PT Short Term Goal 4 (Week 1): Pt will navigate up/down at least 4 steps with minA and LRAD Week 2:  PT Short Term Goal 1 (Week 2): Pt will perform bed mobility with CGA PT Short Term Goal 2 (Week 2): Pt will perform bed<>chair transfer with CGA and LRAD PT Short Term Goal 3 (Week 2): Pt will ambulate at least 153ft with CGA and LRAD PT Short Term Goal 4 (Week 2): Pt will navigate up.down x4 steps with CGA and LRAD  Skilled Therapeutic Interventions/Progress Updates:    PAIN  Denies pain  Pt initially supine and agreeable to session.  Supine to sit w/rail and supervision. Pt able to place feet into shoe but requires max assist to secure feet into shoes. Moderate post tendency w/this activity. STS from elevated bed w/cga, min assist to stabilize SPT w/min assist.  wc propulsion >149ft w/bilat LEs for bipedal activity and LE strengthening.  SPT to mat w/min assist.  Standing balance: Repeated STS x 10 w/emphasis on midline/maointaining balance w/transition/achieving upright posture, initially min a, proresses to cga  Standing alternating tapping 4in step min to lt mod due to decreased coordination RLE  Sidestepping length of mat a 6 , decreased control RLE, cga  Standing w/feet together mild sway, cga Standing feet together eyes closed mild increased sway, cga Standing feet together cervical nods - moderate R lean, mod assist, decreased to min A w/reps                                    Cervical rotation -   " " esp w/turning to R  Standing floor clock - moderate R tendency,  Balance loss occurs x 3 to R, mod assist for recovery.  Gait training performed for gait endurance using Using litegait over ground - pt able to stand in litegait for donning of harness and securing RUE w/acewrap.  Gait 253ft x 2 w/standing rest break x 2 min between trials.  Assist to steer litegait. Pt w/the following deviations, overall stooped posture, narrow base w/cues to increase step width on R, cues for increased clearance on R, cues to focus on heelstrike/clearance/step length R.  Pt able to stand for removal of harness, stand to sit w/cga.  Propells wc 2105ft w/bilat LEs for HS strengthening.   At end of session, pt passed off to NT in hallway due to session timing out.  Therapy Documentation Precautions:  Precautions Precautions: Fall Precaution Comments: R hemiparesis UE>LE Restrictions Weight Bearing Restrictions: No    Therapy/Group: Individual Therapy  Callie Fielding, [place] 06/28/2020, 10:14 AM

## 2020-06-28 NOTE — Consult Note (Signed)
Neuropsychological Consultation   Patient:   Jacob Short   DOB:   1946-02-08  MR Number:  [id number]  Location:  [facility] [address] [facility] [address] [place] [id number] [place] [address] Dept: [place]: [phone]           Date of Service:   06/28/2020  Start Time:   1 PM End Time:   2 PM  Provider/Observer:  Ilean Skill, Psy.D.       Clinical Neuropsychologist       Billing Code/Service: 53614  Chief Complaint:    Jacob Short is a 74 year old male with history of CAD and angioplasty maintained on Plavix, diabetes, tobacco abuse, AAA without rupture and hyperlipidemia.  Patient presented on 06/15/2020 with right-sided weakness.  Cranial CT negative for acute changes.  Chronic infarct of the right corona radiata was noted.  Patient did not receive TPA.  CT angiogram of head and neck but no emergent findings.  MRI showed acute infarct of the left corona radiata.  The patient had initially presented to [facility] ED and was transferred to [facility] as he was outside the window of TPA.  Patient did not receive TPA.  Reason for Service:  Patient was referred for neuropsychological consultation due to coping and adjustment issues following CVA.  Below is the HPI for the current admission.  HPI: Jacob Short is a 74 year old right-handed male with history of CAD and angioplasty maintained on Plavix, diabetes mellitus, tobacco abuse, AAA without rupture and hyperlipidemia.  Per chart review patient lives with spouse.  Independent prior to admission.  1 level home 3 steps to entry.  Presented 06/15/2020 with right side weakness.  Cranial CT scan negative for acute changes.  Chronic lacunar infarct of the right corona radiata.  Patient did not receive TPA.  CT angiogram of head and neck with no emergent findings.  MRI showed acute lacunar infarct of the left corona radiata.  Echocardiogram with ejection fraction of 50  to 43% grade 1 diastolic dysfunction.  Admission chemistries unremarkable aside glucose 157, urine drug screen negative, urinalysis negative nitrite.  Follow neurology services noted given failure of aspirin Plavix recommendations are for aspirin 81 mg daily and Brilinta 90 mg twice daily x30 days and transition back to aspirin and Plavix.  Tolerating regular diet.  Therapy evaluations completed and patient was admitted for a comprehensive rehab program.  Current Status:  The patient had been dealing with weakness and fall risk even prior to the CVA.  He had had to previous falls and had been monitored and assessed through physical therapy for weakness and it started her rehab program.  The patient reports that he remembers the events of the night when he woke up in the melanite with weakness and had difficulty getting to the restroom.  The patient reports that he went and sat in his recliner for a while and went back to sleep.  When he woke up he could not get out of the recliner in the morning and called for his wife and when he stood up he spun around and fell to the floor upon which time they called the ambulance.  The patient reports that this was not a scary or traumatic event and that he was generally aware of what was going on and what was happening with him.  The patient reports that his mood has been stable without significant depression or anxiety and that he is coping fairly well with  extended hospital stay.  Patient also displayed good expressive and receptive language abilities good mental status and cognition throughout this clinical assessment.  Behavioral Observation: Jacob Short  presents as a 74 y.o.-year-old Right Caucasian Male who appeared his stated age. his dress was Appropriate and he was Well Groomed and his manners were Appropriate to the situation.  his participation was indicative of Appropriate and Attentive behaviors.  There were any physical disabilities noted.  he displayed  an appropriate level of cooperation and motivation.     Interactions:    Active Appropriate and Attentive  Attention:   within normal limits and attention span and concentration were age appropriate  Memory:   within normal limits; recent and remote memory intact  Visuo-spatial:  not examined  Speech (Volume):  normal  Speech:   normal; normal  Thought Process:  Coherent and Relevant  Though Content:  WNL; not suicidal and not homicidal  Orientation:   person, place, time/date and situation  Judgment:   Good  Planning:   Good  Affect:    Appropriate  Mood:    Euthymic  Insight:   Good  Intelligence:   high  Medical History:   Past Medical History:  Diagnosis Date  . Coronary artery disease   . Diabetes mellitus without complication ([place])   . Peripheral vascular disease ([place])     Psychiatric History:  No prior psychiatric history  Family Med/Psych History: History reviewed. No pertinent family history.   Impression/DX:  Jacob Short is a 74 year old male with history of CAD and angioplasty maintained on Plavix, diabetes, tobacco abuse, AAA without rupture and hyperlipidemia.  Patient presented on 06/15/2020 with right-sided weakness.  Cranial CT negative for acute changes.  Chronic infarct of the right corona radiata was noted.  Patient did not receive TPA.  CT angiogram of head and neck but no emergent findings.  MRI showed acute infarct of the left corona radiata.  The patient had initially presented to [facility] ED and was transferred to [facility] as he was outside the window of TPA.  Patient did not receive TPA.  The patient had been dealing with weakness and fall risk even prior to the CVA.  He had had to previous falls and had been monitored and assessed through physical therapy for weakness and it started her rehab program.  The patient reports that he remembers the events of the night when he woke up in the melanite with weakness and had difficulty getting to the  restroom.  The patient reports that he went and sat in his recliner for a while and went back to sleep.  When he woke up he could not get out of the recliner in the morning and called for his wife and when he stood up he spun around and fell to the floor upon which time they called the ambulance.  The patient reports that this was not a scary or traumatic event and that he was generally aware of what was going on and what was happening with him.  The patient reports that his mood has been stable without significant depression or anxiety and that he is coping fairly well with extended hospital stay.  Patient also displayed good expressive and receptive language abilities good mental status and cognition throughout this clinical assessment.  Disposition/Plan:  Today we worked on coping and adjustment issues and I do think that the patient is doing fairly well given the extended hospital stay and continued plan for 2 more weeks of  CIR.  However, if the patient does begin to experience difficulties with coping or other other changes in status I will be available to see him again.  Diagnosis:    Small vessel disease, cerebrovascular - Plan: Ambulatory referral to Neurology         Electronically Signed   _______________________ Ilean Skill, Psy.D.

## 2020-06-28 NOTE — Progress Notes (Signed)
[place] PHYSICAL MEDICINE & REHABILITATION PROGRESS NOTE   Subjective/Complaints: Ambulating well in hallway! Very motivated. Goal is to improve his right arm strength. Has no complaints  ROS: Denies CP, SOB, N/V/D  Objective:   No results found. No results for input(s): WBC, HGB, HCT, PLT in the last 72 hours. Recent Labs    06/28/20 0811  NA 140  K 3.9  CL 103  CO2 24  GLUCOSE 127*  BUN 7*  CREATININE 0.88  CALCIUM 9.0    Intake/Output Summary (Last 24 hours) at 06/28/2020 1116 Last data filed at 06/28/2020 0757 Gross per 24 hour  Intake 1082 ml  Output --  Net 1082 ml        Physical Exam: Vital Signs Blood pressure (!) 142/72, pulse 72, temperature 98.4 F (36.9 C), resp. rate 15, height 6\' 4"  (1.93 m), weight 85.1 kg, SpO2 97 %.  General: Alert and oriented x 3, No apparent distress HEENT: Head is normocephalic, atraumatic, PERRLA, EOMI, sclera anicteric, oral mucosa pink and moist, dentition intact, ext ear canals clear,  Neck: Supple without JVD or lymphadenopathy Heart: Reg rate and rhythm. No murmurs rubs or gallops Chest: CTA bilaterally without wheezes, rales, or rhonchi; no distress Abdomen: Soft, non-tender, non-distended, bowel sounds positive. Psych: Normal mood.  Normal behavior. Musc: No edema in extremities.  No tenderness in extremities. Neuro: Alert Mild dysarthria, unchanged Fair awareness of deficits.  Right facial droop, stable Motor: RUE: RUE shoulder abduction 3/5, elbow flexion/extension 2/5, distally 0/5  RLE: 4-4+/5 proximal to distal   Assessment/Plan: 1. Functional deficits secondary to left corona radiata infarct which require 3+ hours per day of interdisciplinary therapy in a comprehensive inpatient rehab setting.  Physiatrist is providing close team supervision and 24 hour management of active medical problems listed below.  Physiatrist and rehab team continue to assess barriers to discharge/monitor patient progress  toward functional and medical goals  Care Tool:  Bathing    Body parts bathed by patient: Right arm, Chest, Abdomen, Front perineal area, Buttocks, Right upper leg, Left upper leg, Face   Body parts bathed by helper: Left arm Body parts n/a: Right lower leg, Left lower leg   Bathing assist Assist Level: Minimal Assistance - Patient > 75%     Upper Body Dressing/Undressing Upper body dressing   What is the patient wearing?: Pull over shirt    Upper body assist Assist Level: Set up assist    Lower Body Dressing/Undressing Lower body dressing      What is the patient wearing?: Pants, Incontinence brief     Lower body assist Assist for lower body dressing: Minimal Assistance - Patient > 75%     Toileting Toileting    Toileting assist Assist for toileting: Maximal Assistance - Patient 25 - 49% Assistive Device Comment: bedpan   Transfers Chair/bed transfer  Transfers assist     Chair/bed transfer assist level: Minimal Assistance - Patient > 75% Chair/bed transfer assistive device: Armrests   Locomotion Ambulation   Ambulation assist      Assist level: Dependent - Patient 0% Assistive device: Lite Gait Max distance: 250   Walk 10 feet activity   Assist     Assist level: Minimal Assistance - Patient > 75% Assistive device: Hand held assist   Walk 50 feet activity   Assist    Assist level: Moderate Assistance - Patient - 50 - 74% Assistive device: No Device, Other (comment) (L HR)    Walk 150 feet activity   Assist  Walk 150 feet activity did not occur: Safety/medical concerns (fatigue after 80ft)  Assist level: Moderate Assistance - Patient - 50 - 74% Assistive device: No Device, Other (comment) (L HR)    Walk 10 feet on uneven surface  activity   Assist Walk 10 feet on uneven surfaces activity did not occur: Safety/medical concerns         Wheelchair     Assist Will patient use wheelchair at discharge?: No Type of  Wheelchair: Manual    Wheelchair assist level: Minimal Assistance - Patient > 75% Max wheelchair distance: 56' using L extremities.    Wheelchair 50 feet with 2 turns activity    Assist        Assist Level: Minimal Assistance - Patient > 75%   Wheelchair 150 feet activity     Assist          Medical Problem List and Plan: 1. Right side hemiparesis secondary to left corona radiata infarct secondary small vessel disease  Continue CIR  WHO don nightly 2.  Antithrombotics: -DVT/anticoagulation: Lovenox- d/c as ambulating >150 feet             -antiplatelet therapy: Current plan is for aspirin 81 mg daily and Brilinta 90 mg twice daily x30 days then transition back to aspirin and Plavix 3. Pain Management:   Robaxin 500 q6 PRN with good benefit  Well controlled.  4. Mood: Provide emotional support             -antipsychotic agents: N/A 5. Neuropsych: This patient is capable of making decisions on his own behalf. 6. Skin/Wound Care: Routine skin checks 7. Fluids/Electrolytes/Nutrition: Routine in and outs. 8.  CAD with angioplasty.  Continue aspirin. 9.  Diabetes mellitus with peripheral neuropathy.  Hemoglobin A1c 6.9.  Glucophage 1000 mg at breakfast 1500 mg with supper.    Glipizide 2.5 started on 10/1  Mildly elevated 10/6  Monitor with increased mobility 10.  Hyperlipidemia.  Lipitor 11.  Tobacco abuse: Patient states that he has quit.  DCed nicotine patch. 12.  Hypertension.  Cozaar 25 mg daily, increased to 50 on 10/3  10/6 slightly elevated- continue to monitor.   Monitor with increased mobility. 13.  History of AAA without rupture.  Follow-up outpatient monitoring.  14.  Hyponatremia  Sodium 134 on 10/1, labs ordered for tomorrow  Continue to monitor 15.  Hypoalbuminemia  Supplement initiated on 9/28 16.  Leukocytosis  WBCs 10.6 on 10/2  Afebrile  Continue to monitor  LOS: 9 days A FACE TO FACE EVALUATION WAS PERFORMED  Jacob Short  Jacob Short 06/28/2020, 11:16 AM

## 2020-06-29 ENCOUNTER — Inpatient Hospital Stay (HOSPITAL_COMMUNITY): Payer: PPO | Admitting: Occupational Therapy

## 2020-06-29 ENCOUNTER — Inpatient Hospital Stay (HOSPITAL_COMMUNITY): Payer: PPO

## 2020-06-29 DIAGNOSIS — R0989 Other specified symptoms and signs involving the circulatory and respiratory systems: Secondary | ICD-10-CM

## 2020-06-29 LAB — GLUCOSE, CAPILLARY: Glucose-Capillary: 112 mg/dL — ABNORMAL HIGH (ref 70–99)

## 2020-06-29 NOTE — Progress Notes (Addendum)
Physical Therapy Session Note  Patient Details  Name: Jacob Short MRN: [id number] Date of Birth: [birth year]  Today's Date: 06/29/2020 PT Individual Time: 1000-1058 + 2706-2376 PT Individual Time Calculation (min): 58 min  + 52min  Short Term Goals: Week 2:  PT Short Term Goal 1 (Week 2): Pt will perform bed mobility with CGA PT Short Term Goal 2 (Week 2): Pt will perform bed<>chair transfer with CGA and LRAD PT Short Term Goal 3 (Week 2): Pt will ambulate at least 1105ft with CGA and LRAD PT Short Term Goal 4 (Week 2): Pt will navigate up.down x4 steps with CGA and LRAD  Skilled Therapeutic Interventions/Progress Updates:     1st session: Pt received sitting in w/c, agreeable to PT session, denies any pain. WC transport from his room to main hallway outside therapy gym. He ambulated ~228ft with minA and RW on level surfaces, demo's intermittent R toe drag and increased difficulty with RW management as fatigue progresses. Performed BERG balance test, scoring 31/56, indicating high falls risk. Score mostly impacted by single leg stance, picking object up from floor, 360deg turns, and unsupported alternating feet on step. He required brief seated rest breaks during completion of BERG. Then performed seated there-ex including 2x10 lateral elbow leans L & R and 3x10 seated push ups using blocks with therapist stabilizing R elbow and hand-over-hand assist for RUE grip. He then ambulated back to his room with minA and RW, stand>sit with CGA and RW, required minA for sit>supine for RLE management. He ended session semi-reclined in bed, 3/4 rails up, bed alarm on, needs in reach.  Patient demonstrates increased fall risk as noted by score of  31/56 on Berg Balance Scale.  (<36= high risk for falls, close to 100%; 37-45 significant >80%; 46-51 moderate >50%; 52-55 lower >25%)   2nd session: Pt received supine in bed, awake and agreeable to PT session; denies any pain. Focus of session was  strengthening in bed level as pt reports moderate fatigue from prior sessions. Performed the following there-ex with brief rest breaks provided b/w sets: -PNF D1 RUE (resisted) + LUE flexion x15 reps -PNF D2 RUE (resisted) + LUE flexion x15 reps -PNF D1 RLE (resisted) + LLE flexion x15 reps -PNF D2 RLE (resisted) + LLE flexion x15 reps -sidelying hip abduction RLE x15 reps -sidelying hip abduction LLE x15 reps -sidelying clamshells RLE x15 reps -sidelying clamshells LLE x15 reps -supine SLR x15 reps  Pt remained semi-reclined in bed at end of session, bed alarm on, needs in reach.  Therapy Documentation Precautions:  Precautions Precautions: Fall Precaution Comments: R hemiparesis UE>LE Restrictions Weight Bearing Restrictions: No  Therapy/Group: Individual Therapy  Jaylea Plourde P Naim Murtha PT 06/29/2020, 7:58 AM

## 2020-06-29 NOTE — Progress Notes (Signed)
Occupational Therapy Session Note  Patient Details  Name: Jacob Short MRN: [id number] Date of Birth: Jun 02, 1946  Today's Date: 06/29/2020 OT Individual Time: 0814-4818 OT Individual Time Calculation (min): 45 min    Short Term Goals: Week 1:  OT Short Term Goal 1 (Week 1): Pt will achieve 75 degrees Active abduction during dressing and bathing right shoulder. OT Short Term Goal 2 (Week 1): Pt will complete LB dressing with min assist. OT Short Term Goal 3 (Week 1): Pt will complete toilet transfer with min assist. OT Short Term Goal 4 (Week 1): Pt will complete LB bathing with min assist.  Skilled Therapeutic Interventions/Progress Updates:    Patient in bed, alert and ready for therapy session.  He declines need for adl tasks at this time and denies pain.  Supine to sitting edge of bed with CS.  SPT with RW bed to w/c CS.  SPT without AD to mat table- focus on posture and weight shift CGA.  Completed NMRE activities in both seated and standing positions with focus on proximal stability, motor control, PROM/AROM, inhibition of flexors, weight bearing, weight shift, posture, balance - he is able to maintain right UE on surface with 90 degrees of shoulder flexion - hand on dycem with full elbow extension after facilitation.  He remained seated in w/c at close of session, seat belt alarm set and call bell in reach.    Therapy Documentation Precautions:  Precautions Precautions: Fall Precaution Comments: R hemiparesis UE>LE Restrictions Weight Bearing Restrictions: No   Therapy/Group: Individual Therapy  Carlos Levering 06/29/2020, 7:32 AM

## 2020-06-29 NOTE — Progress Notes (Signed)
[facility] PHYSICAL MEDICINE & REHABILITATION PROGRESS NOTE   Subjective/Complaints: Patient seen sitting up in bed this morning.  He states he did not sleep well overnight due to staff being busy and unable to attend to him.  He notes he is getting stronger.  ROS: Denies CP, SOB, N/V/D  Objective:   No results found. No results for input(s): WBC, HGB, HCT, PLT in the last 72 hours. Recent Labs    06/28/20 0811  NA 140  K 3.9  CL 103  CO2 24  GLUCOSE 127*  BUN 7*  CREATININE 0.88  CALCIUM 9.0    Intake/Output Summary (Last 24 hours) at 06/29/2020 1309 Last data filed at 06/29/2020 1000 Gross per 24 hour  Intake 760 ml  Output 200 ml  Net 560 ml        Physical Exam: Vital Signs Blood pressure (!) 159/72, pulse 72, temperature 98.4 F (36.9 C), resp. rate 18, height 6\' 4"  (1.93 m), weight 85.2 kg, SpO2 97 %.  Constitutional: No distress . Vital signs reviewed. HENT: Normocephalic.  Atraumatic. Eyes: EOMI. No discharge. Cardiovascular: No JVD.  RRR. Respiratory: Normal effort.  No stridor.  Bilateral clear to auscultation. GI: Non-distended.  BS +. Skin: Warm and dry.  Intact. Psych: Normal mood.  Normal behavior. Musc: No edema in extremities.  No tenderness in extremities. Neuro: Alert Mild dysarthria, stable Fair awareness of deficits.  Right facial droop, unchanged Motor:  RUE shoulder abduction 4-/5, elbow flexion/extension 4-/5, distally 0/5  RLE: 4-4+/5 proximal to distal   Assessment/Plan: 1. Functional deficits secondary to left corona radiata infarct which require 3+ hours per day of interdisciplinary therapy in a comprehensive inpatient rehab setting.  Physiatrist is providing close team supervision and 24 hour management of active medical problems listed below.  Physiatrist and rehab team continue to assess barriers to discharge/monitor patient progress toward functional and medical goals  Care Tool:  Bathing    Body parts bathed by  patient: Right arm, Chest, Abdomen, Front perineal area, Buttocks, Right upper leg, Left upper leg, Face   Body parts bathed by helper: Left arm Body parts n/a: Right lower leg, Left lower leg   Bathing assist Assist Level: Minimal Assistance - Patient > 75%     Upper Body Dressing/Undressing Upper body dressing   What is the patient wearing?: Pull over shirt    Upper body assist Assist Level: Set up assist    Lower Body Dressing/Undressing Lower body dressing      What is the patient wearing?: Pants, Incontinence brief     Lower body assist Assist for lower body dressing: Minimal Assistance - Patient > 75%     Toileting Toileting    Toileting assist Assist for toileting: Maximal Assistance - Patient 25 - 49% Assistive Device Comment: bedpan   Transfers Chair/bed transfer  Transfers assist     Chair/bed transfer assist level: Minimal Assistance - Patient > 75% Chair/bed transfer assistive device: Armrests   Locomotion Ambulation   Ambulation assist      Assist level: Minimal Assistance - Patient > 75% Assistive device: Walker-rolling Max distance: 200   Walk 10 feet activity   Assist     Assist level: Minimal Assistance - Patient > 75% Assistive device: Walker-rolling   Walk 50 feet activity   Assist    Assist level: Minimal Assistance - Patient > 75% Assistive device: Walker-rolling    Walk 150 feet activity   Assist Walk 150 feet activity did not occur: Safety/medical concerns (fatigue  after 24ft)  Assist level: Minimal Assistance - Patient > 75% Assistive device: Walker-rolling    Walk 10 feet on uneven surface  activity   Assist Walk 10 feet on uneven surfaces activity did not occur: Safety/medical concerns         Wheelchair     Assist Will patient use wheelchair at discharge?: No Type of Wheelchair: Manual    Wheelchair assist level: Minimal Assistance - Patient > 75% Max wheelchair distance: 64' using L  extremities.    Wheelchair 50 feet with 2 turns activity    Assist        Assist Level: Minimal Assistance - Patient > 75%   Wheelchair 150 feet activity     Assist          Medical Problem List and Plan: 1. Right side hemiparesis secondary to left corona radiata infarct secondary small vessel disease  Continue CIR  WHO don nightly 2.  Antithrombotics: -DVT/anticoagulation: Lovenox- d/c as ambulating >150 feet             -antiplatelet therapy: Current plan is for aspirin 81 mg daily and Brilinta 90 mg twice daily x30 days then transition back to aspirin and Plavix 3. Pain Management:   Robaxin 500 q6 PRN with good benefit  Controlled with meds on 10/7 4. Mood: Provide emotional support             -antipsychotic agents: N/A 5. Neuropsych: This patient is capable of making decisions on his own behalf. 6. Skin/Wound Care: Routine skin checks 7. Fluids/Electrolytes/Nutrition: Routine in and outs. 8.  CAD with angioplasty.  Continue aspirin. 9.  Diabetes mellitus with peripheral neuropathy.  Hemoglobin A1c 6.9.  Glucophage 1000 mg at breakfast 1500 mg with supper.    Glipizide 2.5 started on 10/1  Mildly elevated 10/7   Monitor with increased mobility 10.  Hyperlipidemia.  Lipitor 11.  Tobacco abuse: Patient states that he has quit.  DCed nicotine patch. 12.  Hypertension.  Cozaar 25 mg daily, increased to 50 on 10/3  Labile on 10/7, monitor trend  Monitor with increased mobility. 13.  History of AAA without rupture.  Follow-up outpatient monitoring.  14.  Hyponatremia  Sodium 140 on 10/6  Continue to monitor 15.  Hypoalbuminemia  Supplement initiated on 9/28 16.  Leukocytosis  WBCs 10.6 on 10/2, labs ordered for tomorrow  Afebrile  Continue to monitor  LOS: 10 days A FACE TO FACE EVALUATION WAS PERFORMED  Jorge Amparo Lorie Phenix 06/29/2020, 1:09 PM

## 2020-06-29 NOTE — Progress Notes (Addendum)
Occupational Therapy Weekly Progress Note  Patient Details  Name: Jacob Short MRN: [id number] Date of Birth: 02/15/1946  Beginning of progress report period: June 20, 2020 End of progress report period: June 29, 2020  Today's Date: 06/29/2020 OT Individual Time: 1400-1500 OT Individual Time Calculation (min): 60 min    Patient has met 4 of 4 short term goals.  Pt is very motivated to get better and is steadily progressing towards STGs and LTGs.  Pt is progressively gaining strength and motor recovery in RUE allowing him to use more functionally including for RW mgt for increased safety during functional mobility.  Pt is also showing good carryover of compensatory strategies and use of AE during self care tasks.  Pt shows most limitation in right wrist extension, forearm supination, and digital extension, however trace movement noted for the first time today.  Pt would benefit from further OT for neuro re-ed and strengthening of RUE and further training in safe functional mobility and use of AE for bathing and LB dressing.    Patient continues to demonstrate the following deficits: muscle weakness, decreased cardiorespiratoy endurance, unbalanced muscle activation, decreased coordination and decreased motor planning, decreased safety awareness and decreased sitting balance, decreased standing balance, hemiplegia and decreased balance strategies and therefore will continue to benefit from skilled OT intervention to enhance overall performance with BADL.  Patient progressing toward long term goals..  Continue plan of care.  OT Short Term Goals Week 2:  OT Short Term Goal 1 (Week 2): Pt will donn/doff socks using AE as needed with min assist OT Short Term Goal 2 (Week 2): Pt will complete walk in shower with 2 inch threshold step over transfer with CGA OT Short Term Goal 3 (Week 2): Pt will achieve 15 degrees right active wrist extension. OT Short Term Goal 4 (Week 2): Pt will donn  pants and brief with CGA.  Skilled Therapeutic Interventions/Progress Updates:    Pt supine in bed with wife sitting at bedside.  Pt with no reports of pain during session.  Pts wife present and supportive throughout session.  Pt completed self care per below levels of assist.  Pt requesting to use toilet after showering and had continent episode of bowel.  Pt returned to EOB after ADLs and participated in neuro re-ed of right wrist and digits using attended NMES to facilitate extension.  Pt returned to supine with supervision.  Call bell in reach, bed alarm on.  Therapy Documentation Precautions:  Precautions Precautions: Fall Precaution Comments: R hemiparesis UE>LE Restrictions Weight Bearing Restrictions: No   ADL: ADL Equipment Provided: Long-handled sponge Grooming: Setup Where Assessed-Grooming: Sitting at sink Upper Body Bathing: Supervision/safety Where Assessed-Upper Body Bathing: Shower Lower Body Bathing: Contact guard Where Assessed-Lower Body Bathing: Shower Upper Body Dressing: Setup Where Assessed-Upper Body Dressing: Chair Lower Body Dressing: Minimal assistance Where Assessed-Lower Body Dressing: Chair Toileting: Contact guard Where Assessed-Toileting: Other (Comment) (3in1 commode over toilet) Toilet Transfer: Therapist, music Method: Counselling psychologist: Drop arm bedside commode Tub/Shower Transfer: Not assessed Tub/Shower Transfer Method: Unable to assess Social research officer, government: Curator Method: Heritage manager: Radio broadcast assistant   Therapy/Group: Individual Therapy  [name] 06/29/2020, 4:28 PM

## 2020-06-29 NOTE — Progress Notes (Signed)
Patient verbalized discomfort right foot spasms Robaxin provided,repositioned for comfort  06/28/2020 @ 2324 States no relief at his time

## 2020-06-30 ENCOUNTER — Inpatient Hospital Stay (HOSPITAL_COMMUNITY): Payer: PPO

## 2020-06-30 ENCOUNTER — Inpatient Hospital Stay (HOSPITAL_COMMUNITY): Payer: PPO | Admitting: Occupational Therapy

## 2020-06-30 DIAGNOSIS — D62 Acute posthemorrhagic anemia: Secondary | ICD-10-CM

## 2020-06-30 LAB — CBC WITH DIFFERENTIAL/PLATELET
Abs Immature Granulocytes: 0.05 10*3/uL (ref 0.00–0.07)
Basophils Absolute: 0 10*3/uL (ref 0.0–0.1)
Basophils Relative: 0 %
Eosinophils Absolute: 0.3 10*3/uL (ref 0.0–0.5)
Eosinophils Relative: 3 %
HCT: 39.1 % (ref 39.0–52.0)
Hemoglobin: 12.9 g/dL — ABNORMAL LOW (ref 13.0–17.0)
Immature Granulocytes: 1 %
Lymphocytes Relative: 12 %
Lymphs Abs: 1.2 10*3/uL (ref 0.7–4.0)
MCH: 31.8 pg (ref 26.0–34.0)
MCHC: 33 g/dL (ref 30.0–36.0)
MCV: 96.3 fL (ref 80.0–100.0)
Monocytes Absolute: 1.1 10*3/uL — ABNORMAL HIGH (ref 0.1–1.0)
Monocytes Relative: 11 %
Neutro Abs: 7.3 10*3/uL (ref 1.7–7.7)
Neutrophils Relative %: 73 %
Platelets: 357 10*3/uL (ref 150–400)
RBC: 4.06 MIL/uL — ABNORMAL LOW (ref 4.22–5.81)
RDW: 13.3 % (ref 11.5–15.5)
WBC: 9.9 10*3/uL (ref 4.0–10.5)
nRBC: 0 % (ref 0.0–0.2)

## 2020-06-30 LAB — GLUCOSE, CAPILLARY
Glucose-Capillary: 106 mg/dL — ABNORMAL HIGH (ref 70–99)
Glucose-Capillary: 113 mg/dL — ABNORMAL HIGH (ref 70–99)

## 2020-06-30 MED ORDER — LOSARTAN POTASSIUM 50 MG PO TABS
75.0000 mg | ORAL_TABLET | Freq: Every day | ORAL | Status: DC
  Administered 2020-07-01 – 2020-07-03 (×3): 75 mg via ORAL
  Filled 2020-06-30 (×3): qty 1

## 2020-06-30 MED ORDER — LOSARTAN POTASSIUM 25 MG PO TABS
25.0000 mg | ORAL_TABLET | Freq: Once | ORAL | Status: AC
  Administered 2020-06-30: 25 mg via ORAL
  Filled 2020-06-30: qty 1

## 2020-06-30 NOTE — Progress Notes (Signed)
Physical Therapy Session Note  Patient Details  Name: Jacob Short MRN: [id number] Date of Birth: 1946-08-16  Today's Date: 06/30/2020 PT Individual Time: 0800-0858 + 1000-1055 PT Individual Time Calculation (min): 58 min  + 55 min  Short Term Goals: Week 2:  PT Short Term Goal 1 (Week 2): Pt will perform bed mobility with CGA PT Short Term Goal 2 (Week 2): Pt will perform bed<>chair transfer with CGA and LRAD PT Short Term Goal 3 (Week 2): Pt will ambulate at least 13ft with CGA and LRAD PT Short Term Goal 4 (Week 2): Pt will navigate up.down x4 steps with CGA and LRAD  Skilled Therapeutic Interventions/Progress Updates:     1st session: Pt received supine in bed, agreeable to PT session; denies any pain. Supine<>sit with CGA with HOB Flat and use of bed rail. Donned shoes with maxA while seated EOB, maintaining LUE grip to bedrail 2/2 posterior lean. Stand<>pivot with CGA/minA and no AD from EOB<>w/c. He self propelled himself in w/c using BLE's only for hamstring isolation from his room to main therapy gym. He then ambulated ~251ft with minA and RW on level surfaces. He demo's intermittent R toe drag with fatigue, narrow BOS, and difficulty with RW management during turns. Wheeled inside // bars to perform NMR standing balance on uneven balance board. He required minA for positioning on board and while holding on with BUE's to // bars, was able to perform both frontal and sagittal plane balance activities with focus on midline COG. Also performed this task with perturbations to balance board to increase difficulty. After seated rest break, stand<>pivot with minA and no AD from w/c to mat table. Sit>supine with CGA for safety on mat table. While laying supine with BLE over medium sized therapy ball, performed 2x10 bridges, 2x10 heel slides, and 2x45 second isometric glut extensions while therapist performed perturbations to ball while pt was instructed to keep ball still with BLE's. Then  performed rolling supine<>prone on mat table with minA for RUE management and attempted to perform prone<>quad but unable to perform transfer without +2 assist. Deferred quad positioning, prone<>supine with minA, supine<>sit with minA. He ambulated back to his room with minA and RW (~148ft) with similar gait deficits noted above. He remained seated in w/c at end of session with needs in reach, 1/2 lap tray for RUE.  2nd session: Pt received sitting in w/c, agreeable to PT session; denies any pain. Pt requesting to focus on RUE strengthening this session. WC transport for time management from his room to main therapy gym. While seated in w/c, performed the following RUE strengthening: -2x15 shldr flex with RUE ace wrapped to 2# dowel rod -2x15 chest press with RUE ace wrapped to 2# dowel rod -2x15 forearm curls with RUE ace wrapped to 2# dowel rod -2x15 AAROM RUE FA supination/pronation -2x15 AAROM RUE scapular flexion -2x15 AAROM RUE wrist ext in gravity eliminated position -2x10 w/c push ups -2x10 AAROM digit flex/ext  He then performed modified pushups 2x10 on // bars with therapist stabilizing RUE grasp to // bar for safety. He ambulated short distance within gym with minA and no AD, he shows improved narrow BOS and R heel strike. He performed stair training up/down x8 steps with min/modA and R HR, required frequent reminders for step sequencing (ascend with LLE, descend with RLE). Spoke with him about getting a L HR installed at home and he reports his wife is in contact with [employer] to see if possible. He ambulated back to  his room with minA and RW, stand>sit with CGA and RW, sit>supine with minA for RLE management. Ended session semi-reclined in bed with needs in reach, bed alarm on, bed made comfortable.    Therapy Documentation Precautions:  Precautions Precautions: Fall Precaution Comments: R hemiparesis UE>LE Restrictions Weight Bearing Restrictions: No  Therapy/Group: Individual  Therapy  Leanor Voris P Davionne Mastrangelo PT 06/30/2020, 7:50 AM

## 2020-06-30 NOTE — Progress Notes (Signed)
[facility] PHYSICAL MEDICINE & REHABILITATION PROGRESS NOTE   Subjective/Complaints: Patient seen sitting up in bed this morning.  He states he did not sleep well overnight due to staffing issues discussed with nursing director.  He notes improvement in wrist extension.  ROS: Denies CP, SOB, N/V/D  Objective:   No results found. Recent Labs    06/30/20 0635  WBC 9.9  HGB 12.9*  HCT 39.1  PLT 357   Recent Labs    06/28/20 0811  NA 140  K 3.9  CL 103  CO2 24  GLUCOSE 127*  BUN 7*  CREATININE 0.88  CALCIUM 9.0    Intake/Output Summary (Last 24 hours) at 06/30/2020 1033 Last data filed at 06/30/2020 0700 Gross per 24 hour  Intake 840 ml  Output 100 ml  Net 740 ml        Physical Exam: Vital Signs Blood pressure (!) 169/89, pulse 75, temperature 97.9 F (36.6 C), resp. rate 18, height 6\' 4"  (1.93 m), weight 84.3 kg, SpO2 97 %.  Constitutional: No distress . Vital signs reviewed. HENT: Normocephalic.  Atraumatic. Eyes: EOMI. No discharge. Cardiovascular: No JVD.  RRR. Respiratory: Normal effort.  No stridor.  Bilateral clear to auscultation. GI: Non-distended.  BS +. Skin: Warm and dry.  Intact. Psych: Normal mood.  Normal behavior. Musc: No edema in extremities.  No tenderness in extremities. Neuro: Alert Mild dysarthria, unchanged Fair awareness of deficits.  Right facial droop, unchanged Motor:  RUE shoulder abduction 4-/5, elbow flexion/extension 4-/5, wrist extension 1/5, distally 0/5  RLE: 4-4+/5 proximal to distal   Assessment/Plan: 1. Functional deficits secondary to left corona radiata infarct which require 3+ hours per day of interdisciplinary therapy in a comprehensive inpatient rehab setting.  Physiatrist is providing close team supervision and 24 hour management of active medical problems listed below.  Physiatrist and rehab team continue to assess barriers to discharge/monitor patient progress toward functional and medical goals  Care  Tool:  Bathing    Body parts bathed by patient: Right arm, Chest, Abdomen, Front perineal area, Buttocks, Right upper leg, Left upper leg, Face, Left arm, Left lower leg, Right lower leg   Body parts bathed by helper: Left arm Body parts n/a: Right lower leg, Left lower leg   Bathing assist Assist Level: Contact Guard/Touching assist     Upper Body Dressing/Undressing Upper body dressing   What is the patient wearing?: Pull over shirt    Upper body assist Assist Level: Set up assist    Lower Body Dressing/Undressing Lower body dressing      What is the patient wearing?: Pants, Incontinence brief     Lower body assist Assist for lower body dressing: Minimal Assistance - Patient > 75%     Toileting Toileting    Toileting assist Assist for toileting: Contact Guard/Touching assist Assistive Device Comment: bedpan   Transfers Chair/bed transfer  Transfers assist     Chair/bed transfer assist level: Minimal Assistance - Patient > 75% Chair/bed transfer assistive device: Armrests   Locomotion Ambulation   Ambulation assist      Assist level: Minimal Assistance - Patient > 75% Assistive device: Walker-rolling Max distance: 200   Walk 10 feet activity   Assist     Assist level: Minimal Assistance - Patient > 75% Assistive device: Walker-rolling   Walk 50 feet activity   Assist    Assist level: Minimal Assistance - Patient > 75% Assistive device: Walker-rolling    Walk 150 feet activity   Assist Walk  150 feet activity did not occur: Safety/medical concerns (fatigue after 62ft)  Assist level: Minimal Assistance - Patient > 75% Assistive device: Walker-rolling    Walk 10 feet on uneven surface  activity   Assist Walk 10 feet on uneven surfaces activity did not occur: Safety/medical concerns         Wheelchair     Assist Will patient use wheelchair at discharge?: No Type of Wheelchair: Manual    Wheelchair assist level: Minimal  Assistance - Patient > 75% Max wheelchair distance: 74' using L extremities.    Wheelchair 50 feet with 2 turns activity    Assist        Assist Level: Minimal Assistance - Patient > 75%   Wheelchair 150 feet activity     Assist          Medical Problem List and Plan: 1. Right side hemiparesis secondary to left corona radiata infarct secondary small vessel disease  Continue CIR  WHO don nightly 2.  Antithrombotics: -DVT/anticoagulation: Lovenox- d/c as ambulating >150 feet             -antiplatelet therapy: Current plan is for aspirin 81 mg daily and Brilinta 90 mg twice daily x30 days then transition back to aspirin and Plavix 3. Pain Management:   Robaxin 500 q6 PRN with good benefit  Controlled with meds on 10/8 4. Mood: Provide emotional support             -antipsychotic agents: N/A 5. Neuropsych: This patient is capable of making decisions on his own behalf. 6. Skin/Wound Care: Routine skin checks 7. Fluids/Electrolytes/Nutrition: Routine in and outs. 8.  CAD with angioplasty.  Continue aspirin. 9.  Diabetes mellitus with peripheral neuropathy.  Hemoglobin A1c 6.9.  Glucophage 1000 mg at breakfast 1500 mg with supper.    Glipizide 2.5 started on 10/1  Mildly elevated 10/8  Monitor with increased mobility 10.  Hyperlipidemia.  Lipitor 11.  Tobacco abuse: Patient states that he has quit.  DCed nicotine patch. 12.  Hypertension.  Cozaar 25 mg daily, increased to 50 on 10/3, increased to 75 on 10/7  Trending up on 10/8  Monitor with increased mobility. 13.  History of AAA without rupture.  Follow-up outpatient monitoring.  14.  Hyponatremia  Sodium 140 on 10/6  Continue to monitor 15.  Hypoalbuminemia  Supplement initiated on 9/28 16.  Leukocytosis: Resolved  WBCs 9.9 on 10/8  Afebrile  Continue to monitor 17.  Mild acute blood loss anemia, however overall trending down  Hemoglobin 12.9 on 10/8, labs ordered for Monday  Continue to monitor  LOS: 11  days A FACE TO FACE EVALUATION WAS PERFORMED  Judianne Seiple Lorie Phenix 06/30/2020, 10:33 AM

## 2020-06-30 NOTE — Progress Notes (Signed)
Occupational Therapy Session Note  Patient Details  Name: Jacob Short MRN: [id number] Date of Birth: Apr 10, 1946  Today's Date: 06/30/2020 OT Individual Time: 1300-1400 OT Individual Time Calculation (min): 60 min    Short Term Goals: Week 2:  OT Short Term Goal 1 (Week 2): Pt will donn/doff socks using AE as needed with min assist OT Short Term Goal 2 (Week 2): Pt will complete walk in shower with 2 inch threshold step over transfer with CGA OT Short Term Goal 3 (Week 2): Pt will achieve 15 degrees right active wrist extension. OT Short Term Goal 4 (Week 2): Pt will donn pants and brief with CGA.  Skilled Therapeutic Interventions/Progress Updates:    Pt supine in bed, no c/o pain, declining ADLs today "since I took a shower yesterday", requesting to strengthen RUE.  Pt completed supine to sit with CGA.  Sit to stand and ambulated to ortho gym using RW with CGA and VCs needed to widen BOS during gait.  Pt completed x 10 minutes using nustep resistance level 4 to strengthen RUE and RLE.  Pt ambulated nustep to EOM and stand to sit with CGA using RW.  Pt needed min assist each time reaching for right RW handle to place right hand.  Pt completed supine shoulder protraction/retraction to strengthen periscapular musculature for improved stability and functional use of RUE.  Pt completed triceps press in supine using 1.5 lb wrist weight.  Pt completed each 3 x 12 reps. Pt then completed neuro re-ed using attended NMES right wrist and digital extension.  Pt ambulated back to room, sit to supine with CGA.  Call bell in reach, bed alarm on.   Therapy Documentation Precautions:  Precautions Precautions: Fall Precaution Comments: R hemiparesis UE>LE Restrictions Weight Bearing Restrictions: No   Therapy/Group: Individual Therapy  [name] 06/30/2020, 3:36 PM

## 2020-07-01 ENCOUNTER — Inpatient Hospital Stay (HOSPITAL_COMMUNITY): Payer: PPO | Admitting: Physical Therapy

## 2020-07-01 LAB — GLUCOSE, CAPILLARY
Glucose-Capillary: 123 mg/dL — ABNORMAL HIGH (ref 70–99)
Glucose-Capillary: 130 mg/dL — ABNORMAL HIGH (ref 70–99)

## 2020-07-01 NOTE — Progress Notes (Signed)
[place] PHYSICAL MEDICINE & REHABILITATION PROGRESS NOTE   Subjective/Complaints:   Pt reports needs to have BM this AM- doesn't need medication intervention.   Per nursing, had large bladder incontinence in brief last night- which is rare for him- no change in cognition.    ROS:  Pt denies SOB, abd pain, CP, N/V/C/D, and vision changes   Objective:   No results found. Recent Labs    06/30/20 0635  WBC 9.9  HGB 12.9*  HCT 39.1  PLT 357   No results for input(s): NA, K, CL, CO2, GLUCOSE, BUN, CREATININE, CALCIUM in the last 72 hours.  Intake/Output Summary (Last 24 hours) at 07/01/2020 1028 Last data filed at 07/01/2020 0833 Gross per 24 hour  Intake 1020 ml  Output 300 ml  Net 720 ml        Physical Exam: Vital Signs Blood pressure (!) 149/80, pulse 66, temperature 97.6 F (36.4 C), temperature source Oral, resp. rate 17, height 6\' 4"  (1.93 m), weight 86.2 kg, SpO2 96 %.  Constitutional: No distress . Vital signs reviewed. Laying supine in bed; NAD HENT: Normocephalic.  Atraumatic. Eyes: EOMI. No discharge. Cardiovascular: RRR_ no JVD Respiratory: CTA B/L- no W/R/R- good air movement GI: Soft, NT, ND, (+)BS  Skin: Warm and dry.  Intact. Wearing brief Psych: appropriate. Musc: No edema in extremities.  No tenderness in extremities. Neuro: Alert Mild dysarthria, no change Fair awareness of deficits.  Right facial droop, unchanged Motor:  RUE shoulder abduction 4-/5, elbow flexion/extension 4-/5, wrist extension 1/5, distally 0/5  RLE: 4-4+/5 proximal to distal   Assessment/Plan: 1. Functional deficits secondary to left corona radiata infarct which require 3+ hours per day of interdisciplinary therapy in a comprehensive inpatient rehab setting.  Physiatrist is providing close team supervision and 24 hour management of active medical problems listed below.  Physiatrist and rehab team continue to assess barriers to discharge/monitor patient progress  toward functional and medical goals  Care Tool:  Bathing    Body parts bathed by patient: Right arm, Chest, Abdomen, Front perineal area, Buttocks, Right upper leg, Left upper leg, Face, Left arm, Left lower leg, Right lower leg   Body parts bathed by helper: Left arm Body parts n/a: Right lower leg, Left lower leg   Bathing assist Assist Level: Contact Guard/Touching assist     Upper Body Dressing/Undressing Upper body dressing   What is the patient wearing?: Pull over shirt    Upper body assist Assist Level: Set up assist    Lower Body Dressing/Undressing Lower body dressing      What is the patient wearing?: Pants, Incontinence brief     Lower body assist Assist for lower body dressing: Minimal Assistance - Patient > 75%     Toileting Toileting    Toileting assist Assist for toileting: Contact Guard/Touching assist Assistive Device Comment: bedpan   Transfers Chair/bed transfer  Transfers assist     Chair/bed transfer assist level: Minimal Assistance - Patient > 75% Chair/bed transfer assistive device: Armrests   Locomotion Ambulation   Ambulation assist      Assist level: Minimal Assistance - Patient > 75% Assistive device: Walker-rolling Max distance: 200   Walk 10 feet activity   Assist     Assist level: Minimal Assistance - Patient > 75% Assistive device: Walker-rolling   Walk 50 feet activity   Assist    Assist level: Minimal Assistance - Patient > 75% Assistive device: Walker-rolling    Walk 150 feet activity   Assist  Walk 150 feet activity did not occur: Safety/medical concerns (fatigue after 49ft)  Assist level: Minimal Assistance - Patient > 75% Assistive device: Walker-rolling    Walk 10 feet on uneven surface  activity   Assist Walk 10 feet on uneven surfaces activity did not occur: Safety/medical concerns         Wheelchair     Assist Will patient use wheelchair at discharge?: No Type of Wheelchair:  Manual    Wheelchair assist level: Minimal Assistance - Patient > 75% Max wheelchair distance: 36' using L extremities.    Wheelchair 50 feet with 2 turns activity    Assist        Assist Level: Minimal Assistance - Patient > 75%   Wheelchair 150 feet activity     Assist          Medical Problem List and Plan: 1. Right side hemiparesis secondary to left corona radiata infarct secondary small vessel disease  Continue CIR  WHO don nightly 2.  Antithrombotics: -DVT/anticoagulation: Lovenox- d/c as ambulating >150 feet             -antiplatelet therapy: Current plan is for aspirin 81 mg daily and Brilinta 90 mg twice daily x30 days then transition back to aspirin and Plavix 3. Pain Management:   Robaxin 500 q6 PRN with good benefit  Controlled with meds on 10/8 4. Mood: Provide emotional support             -antipsychotic agents: N/A 5. Neuropsych: This patient is capable of making decisions on his own behalf. 6. Skin/Wound Care: Routine skin checks 7. Fluids/Electrolytes/Nutrition: Routine in and outs. 8.  CAD with angioplasty.  Continue aspirin. 9.  Diabetes mellitus with peripheral neuropathy.  Hemoglobin A1c 6.9.  Glucophage 1000 mg at breakfast 1500 mg with supper.    Glipizide 2.5 started on 10/1  Mildly elevated 10/8  10/9- BGs 106-123- improved- con't regimen  Monitor with increased mobility 10.  Hyperlipidemia.  Lipitor 11.  Tobacco abuse: Patient states that he has quit.  DCed nicotine patch. 12.  Hypertension.  Cozaar 25 mg daily, increased to 50 on 10/3, increased to 75 on 10/7  Trending up on 10/8  10/9- BP 149/80 this AM- just increased Cozaar 2 days ago- will give another day to see if improves  Monitor with increased mobility. 13.  History of AAA without rupture.  Follow-up outpatient monitoring.  14.  Hyponatremia  Sodium 140 on 10/6  10/9- check weekly  Continue to monitor 15.  Hypoalbuminemia  Supplement initiated on 9/28 16.   Leukocytosis: Resolved  WBCs 9.9 on 10/8  Afebrile  Continue to monitor 17.  Mild acute blood loss anemia, however overall trending down  Hemoglobin 12.9 on 10/8, labs ordered for Monday  Continue to monitor  LOS: 12 days A FACE TO FACE EVALUATION WAS PERFORMED  Safina Huard 07/01/2020, 10:28 AM

## 2020-07-01 NOTE — Progress Notes (Signed)
Physical Therapy Session Note  Patient Details  Name: Jacob Short MRN: [id number] Date of Birth: 1945-09-29  Today's Date: 07/01/2020 PT Individual Time: 0830-0930 PT Individual Time Calculation (min): 60 min   Short Term Goals: Week 1:  PT Short Term Goal 1 (Week 1): Pt will perform bed mobility with minA PT Short Term Goal 2 (Week 1): Pt will perform bed<>chair transfer with minA and LRAD PT Short Term Goal 3 (Week 1): Pt will ambulate at least 65ft with minA and LRAD PT Short Term Goal 4 (Week 1): Pt will navigate up/down at least 4 steps with minA and LRAD Week 2:  PT Short Term Goal 1 (Week 2): Pt will perform bed mobility with CGA PT Short Term Goal 2 (Week 2): Pt will perform bed<>chair transfer with CGA and LRAD PT Short Term Goal 3 (Week 2): Pt will ambulate at least 149ft with CGA and LRAD PT Short Term Goal 4 (Week 2): Pt will navigate up.down x4 steps with CGA and LRAD  Skilled Therapeutic Interventions/Progress Updates:    pt received in [facility] and agreeable to therapy. Pt denied pain at start and end of session. Pt taken to gym in [address] [facility] for time and energy conservation. Pt directed in NMRE activities of dynamic standing balance with cone alternating taps with BUE at [facility] for support with duel task training with alphabetical listing of categories a-z, CGA, increased time for cognition with listing with activity. Pt then directed in similar task with x2 cones for R and L targeted tapping with BUE support at [place] and alphabetical listing of categories a-z, one instance of min A for balance righting all for improved stability with mobility in standing and decreased fall risk. Pt directed in gait training with AD 10' to // at min A decreased step length and height and poor arm swing noted. In // pt directed in balance board training without UE support and pt to stand without BUE support however pt demonstrated great difficulty gaining good standing balance without LUE support slightly at  L // and grossly min A, x4 mins. Post sitting rest break in WC, pt directed in simulated golf swing as pt reported he very much enjoyed golfing prior to admission, pt instructed in 1# arm bar for BUE at "club" and directed to attempt swinging as he would a golf club, pt directed in x20 swings with LUE somewhat assisting RUE with support of arm bar but pt improved each time and progressed to full swing at min A with semi squatted position, pt directed in targeted swing with small inflated ball and large target to swing and hit 2x10 CGA.  Pt then directed in gait training without AD for 100' x2 CGA with min A at last 25' 2/2 fatigue, VC for trunk extension, increased step length on RLE, and improved arm swing with gait pattern. Pt returned to room in [facility], left in [facility], alarm belt set, All needs in reach and in good condition. Call light in hand.     Therapy Documentation Precautions:  Precautions Precautions: Fall Precaution Comments: R hemiparesis UE>LE Restrictions Weight Bearing Restrictions: No    Therapy/Group: Individual Therapy  Junie Panning 07/01/2020, 9:54 AM

## 2020-07-02 LAB — GLUCOSE, CAPILLARY: Glucose-Capillary: 115 mg/dL — ABNORMAL HIGH (ref 70–99)

## 2020-07-02 NOTE — Progress Notes (Signed)
[place] PHYSICAL MEDICINE & REHABILITATION PROGRESS NOTE   Subjective/Complaints:   Pt reports day off therapy today- had a lot of therapy yesterday- LBM yesterday Room is cold, but pt loves it.    ROS:   Pt denies SOB, abd pain, CP, N/V/C/D, and vision changes  Objective:   No results found. Recent Labs    06/30/20 0635  WBC 9.9  HGB 12.9*  HCT 39.1  PLT 357   No results for input(s): NA, K, CL, CO2, GLUCOSE, BUN, CREATININE, CALCIUM in the last 72 hours.  Intake/Output Summary (Last 24 hours) at 07/02/2020 1045 Last data filed at 07/02/2020 0800 Gross per 24 hour  Intake 1280 ml  Output 1000 ml  Net 280 ml        Physical Exam: Vital Signs Blood pressure (!) 160/81, pulse 79, temperature 98.2 F (36.8 C), temperature source Oral, resp. rate 15, height 6\' 4"  (1.93 m), weight 86 kg, SpO2 98 %.  Constitutional: sitting up slightly in bed watching TV, NAD HENT: Normocephalic.  Atraumatic. Eyes:conjugate gaze Cardiovascular:RRR Respiratory: CTA B/L- no W/R/R- good air movement GI: Soft, NT, ND, (+)BS  Skin: Warm and dry.  Intact. Wearing brief Psych: appropriate and smiling Musc: No edema in extremities.  No tenderness in extremities. Neuro: Alert Mild dysarthria, no change Fair awareness of deficits.  Right facial droop, unchanged Motor:  RUE shoulder abduction 4-/5, elbow flexion/extension 4-/5, wrist extension 1/5, distally 0/5  RLE: 4-4+/5 proximal to distal   Assessment/Plan: 1. Functional deficits secondary to left corona radiata infarct which require 3+ hours per day of interdisciplinary therapy in a comprehensive inpatient rehab setting.  Physiatrist is providing close team supervision and 24 hour management of active medical problems listed below.  Physiatrist and rehab team continue to assess barriers to discharge/monitor patient progress toward functional and medical goals  Care Tool:  Bathing    Body parts bathed by patient: Right  arm, Chest, Abdomen, Front perineal area, Buttocks, Right upper leg, Left upper leg, Face, Left arm, Left lower leg, Right lower leg   Body parts bathed by helper: Left arm Body parts n/a: Right lower leg, Left lower leg   Bathing assist Assist Level: Contact Guard/Touching assist     Upper Body Dressing/Undressing Upper body dressing   What is the patient wearing?: Pull over shirt    Upper body assist Assist Level: Set up assist    Lower Body Dressing/Undressing Lower body dressing      What is the patient wearing?: Pants, Incontinence brief     Lower body assist Assist for lower body dressing: Minimal Assistance - Patient > 75%     Toileting Toileting    Toileting assist Assist for toileting: Contact Guard/Touching assist Assistive Device Comment: bedpan   Transfers Chair/bed transfer  Transfers assist     Chair/bed transfer assist level: Minimal Assistance - Patient > 75% Chair/bed transfer assistive device: Armrests   Locomotion Ambulation   Ambulation assist      Assist level: Minimal Assistance - Patient > 75% Assistive device: Walker-rolling Max distance: 200   Walk 10 feet activity   Assist     Assist level: Minimal Assistance - Patient > 75% Assistive device: Walker-rolling   Walk 50 feet activity   Assist    Assist level: Minimal Assistance - Patient > 75% Assistive device: Walker-rolling    Walk 150 feet activity   Assist Walk 150 feet activity did not occur: Safety/medical concerns (fatigue after 64ft)  Assist level: Minimal Assistance -  Patient > 75% Assistive device: Walker-rolling    Walk 10 feet on uneven surface  activity   Assist Walk 10 feet on uneven surfaces activity did not occur: Safety/medical concerns         Wheelchair     Assist Will patient use wheelchair at discharge?: No Type of Wheelchair: Manual    Wheelchair assist level: Minimal Assistance - Patient > 75% Max wheelchair distance: 66'  using L extremities.    Wheelchair 50 feet with 2 turns activity    Assist        Assist Level: Minimal Assistance - Patient > 75%   Wheelchair 150 feet activity     Assist          Medical Problem List and Plan: 1. Right side hemiparesis secondary to left corona radiata infarct secondary small vessel disease  Continue CIR  WHO don nightly 2.  Antithrombotics: -DVT/anticoagulation: Lovenox- d/c as ambulating >150 feet             -antiplatelet therapy: Current plan is for aspirin 81 mg daily and Brilinta 90 mg twice daily x30 days then transition back to aspirin and Plavix 3. Pain Management:   10/10- pain controlled- con't regimen 4. Mood: Provide emotional support             -antipsychotic agents: N/A 5. Neuropsych: This patient is capable of making decisions on his own behalf. 6. Skin/Wound Care: Routine skin checks 7. Fluids/Electrolytes/Nutrition: Routine in and outs. 8.  CAD with angioplasty.  Continue aspirin. 9.  Diabetes mellitus with peripheral neuropathy.  Hemoglobin A1c 6.9.  Glucophage 1000 mg at breakfast 1500 mg with supper.    Glipizide 2.5 started on 10/1  Mildly elevated 10/8  10/10- BGs 113-130- slightly elevated, but good overall- con't regimen  Monitor with increased mobility 10.  Hyperlipidemia.  Lipitor 11.  Tobacco abuse: Patient states that he has quit.  DCed nicotine patch. 12.  Hypertension.  Cozaar 25 mg daily, increased to 50 on 10/3, increased to 75 on 10/7  Trending up on 10/8  10/9- BP 149/80 this AM- just increased Cozaar 2 days ago- will give another day to see if improves  10/10- BP 160/81 this AM- according to chart, got first dose of 75 mg Losartan Saturday- so will wait to titrate up to 100 mg daily.   Monitor with increased mobility. 13.  History of AAA without rupture.  Follow-up outpatient monitoring.  14.  Hyponatremia  Sodium 140 on 10/6  10/9- check weekly  Continue to monitor 15.  Hypoalbuminemia  Supplement  initiated on 9/28 16.  Leukocytosis: Resolved  WBCs 9.9 on 10/8  Afebrile  Continue to monitor 17.  Mild acute blood loss anemia, however overall trending down  Hemoglobin 12.9 on 10/8, labs ordered for Monday  Continue to monitor  LOS: 13 days A FACE TO FACE EVALUATION WAS PERFORMED  Delinda Malan 07/02/2020, 10:45 AM

## 2020-07-03 ENCOUNTER — Inpatient Hospital Stay (HOSPITAL_COMMUNITY): Payer: PPO | Admitting: Occupational Therapy

## 2020-07-03 ENCOUNTER — Inpatient Hospital Stay (HOSPITAL_COMMUNITY): Payer: PPO

## 2020-07-03 LAB — CBC WITH DIFFERENTIAL/PLATELET
Abs Immature Granulocytes: 0.05 10*3/uL (ref 0.00–0.07)
Basophils Absolute: 0 10*3/uL (ref 0.0–0.1)
Basophils Relative: 0 %
Eosinophils Absolute: 0.2 10*3/uL (ref 0.0–0.5)
Eosinophils Relative: 2 %
HCT: 39.6 % (ref 39.0–52.0)
Hemoglobin: 12.9 g/dL — ABNORMAL LOW (ref 13.0–17.0)
Immature Granulocytes: 0 %
Lymphocytes Relative: 12 %
Lymphs Abs: 1.4 10*3/uL (ref 0.7–4.0)
MCH: 31.6 pg (ref 26.0–34.0)
MCHC: 32.6 g/dL (ref 30.0–36.0)
MCV: 97.1 fL (ref 80.0–100.0)
Monocytes Absolute: 0.9 10*3/uL (ref 0.1–1.0)
Monocytes Relative: 8 %
Neutro Abs: 8.6 10*3/uL — ABNORMAL HIGH (ref 1.7–7.7)
Neutrophils Relative %: 78 %
Platelets: 369 10*3/uL (ref 150–400)
RBC: 4.08 MIL/uL — ABNORMAL LOW (ref 4.22–5.81)
RDW: 13.5 % (ref 11.5–15.5)
WBC: 11.2 10*3/uL — ABNORMAL HIGH (ref 4.0–10.5)
nRBC: 0 % (ref 0.0–0.2)

## 2020-07-03 LAB — GLUCOSE, CAPILLARY: Glucose-Capillary: 108 mg/dL — ABNORMAL HIGH (ref 70–99)

## 2020-07-03 LAB — BASIC METABOLIC PANEL
Anion gap: 10 (ref 5–15)
BUN: 9 mg/dL (ref 8–23)
CO2: 23 mmol/L (ref 22–32)
Calcium: 8.8 mg/dL — ABNORMAL LOW (ref 8.9–10.3)
Chloride: 104 mmol/L (ref 98–111)
Creatinine, Ser: 0.75 mg/dL (ref 0.61–1.24)
GFR, Estimated: >60 mL/min (ref >60)
Glucose, Bld: 142 mg/dL — ABNORMAL HIGH (ref 70–99)
Potassium: 4 mmol/L (ref 3.5–5.1)
Sodium: 137 mmol/L (ref 135–145)

## 2020-07-03 MED ORDER — LOSARTAN POTASSIUM 50 MG PO TABS
100.0000 mg | ORAL_TABLET | Freq: Every day | ORAL | Status: DC
  Administered 2020-07-04 – 2020-07-11 (×8): 100 mg via ORAL
  Filled 2020-07-03 (×8): qty 2

## 2020-07-03 MED ORDER — LOSARTAN POTASSIUM 25 MG PO TABS
25.0000 mg | ORAL_TABLET | Freq: Once | ORAL | Status: AC
  Administered 2020-07-03: 25 mg via ORAL
  Filled 2020-07-03: qty 1

## 2020-07-03 NOTE — Progress Notes (Signed)
Occupational Therapy Session Note  Patient Details  Name: Jacob Short MRN: [id number] Date of Birth: 23-Jan-1946  Today's Date: 07/03/2020 OT Individual Time: 1115-1200 OT Individual Time Calculation (min): 45 min    Short Term Goals: Week 2:  OT Short Term Goal 1 (Week 2): Pt will donn/doff socks using AE as needed with min assist OT Short Term Goal 2 (Week 2): Pt will complete walk in shower with 2 inch threshold step over transfer with CGA OT Short Term Goal 3 (Week 2): Pt will achieve 15 degrees right active wrist extension. OT Short Term Goal 4 (Week 2): Pt will donn pants and brief with CGA.  Skilled Therapeutic Interventions/Progress Updates:    Patient seated in w/c, denies pain and is ready for therapy session.  He states that he would like to work on his right UE.   Provided solid back rest due to ongoing flexed posture in w/c - patient states that it is comfortable.  SPT to/from w/c and mat table with CGA.  Completed posture, scapular mobility and stability activities, weight bearing in various positions, seated/standing/supine motor control activities to promote proximal control and AROM, note improved wrist activation and proximal control.  He remained seated in w/c at close of session, seat belt alarm set and call bell in hand.    Therapy Documentation Precautions:  Precautions Precautions: Fall Precaution Comments: R hemiparesis UE>LE Restrictions Weight Bearing Restrictions: No   Therapy/Group: Individual Therapy  Carlos Levering 07/03/2020, 7:38 AM

## 2020-07-03 NOTE — Progress Notes (Signed)
Physical Therapy Session Note  Patient Details  Name: Jacob Short MRN: [id number] Date of Birth: 12/24/1945  Today's Date: 07/03/2020 PT Individual Time: 3664-4034 + 1300-1400 PT Individual Time Calculation (min): 28 min  + 60 min  Short Term Goals: Week 2:  PT Short Term Goal 1 (Week 2): Pt will perform bed mobility with CGA PT Short Term Goal 2 (Week 2): Pt will perform bed<>chair transfer with CGA and LRAD PT Short Term Goal 3 (Week 2): Pt will ambulate at least 126ft with CGA and LRAD PT Short Term Goal 4 (Week 2): Pt will navigate up.down x4 steps with CGA and LRAD  Skilled Therapeutic Interventions/Progress Updates:     1st session: Pt received sitting in w/c, awake and agreeable to PT session; denies any pain. WC transport for time management from his room to main hallway. Performed gait training with RW including weaving in/out of x8 cones (spaced 39ft apart) and stepping over x8 cones with RLE focusing on R hip flexion and R step length. Pt with difficulty with both of these tasks, requires minA for steadying and occasional minA for RW management. He ambulated a total of ~32ft + ~130ft during these tasks. Then performed seated there-ex with 4# ankle weight, performing R LAQ with 5 sec isometric holds x10 reps and R hip flexion with 5 sec isometric holds x10 reps. Finished session self propelling himself with BLE's only back to his room (>152ft) with supervision on level surfaces. He remained seated in w/c at end of session with needs in reach, made comfortable.   2nd session: Pt received sitting in w/c, agreeable to PT session. He denies any pain. WC transport for time management from his room to dayroom gym. Sit<>stand with CGA and no AD, performed standing balance with putt-putt golf activity, x4 total trials with therapist providing minA guard. He was succesful in putting 1/9 balls into the putt-putt course, using both LUE & RUE for grasping putter. He then performed treadmill  gait training using LiteGait with mild body-weight offloading. He ambulated forward walking for 432ft at a comfortable gait speed of 0.9 m/s. He then performed lateral stepping on treadmill with mild body-weight offloading, each direction, for 39ft at 0.2 m/s. Pt completed all LiteGait training without seated rest break, reporting 6/10 RPE after completion. WC transport back to his room for time management, stand<>step<>pivot transfer with minA and no AD. Sit>Supine with minA for RLE management. Remained semi-reclined in bed with needs in reach at end of session, bed alarm on.   Therapy Documentation Precautions:  Precautions Precautions: Fall Precaution Comments: R hemiparesis UE>LE Restrictions Weight Bearing Restrictions: No  Therapy/Group: Individual Therapy  Anaysia Germer P Abdurahman Rugg PT 07/03/2020, 7:54 AM

## 2020-07-03 NOTE — Progress Notes (Signed)
Occupational Therapy Session Note  Patient Details  Name: Jacob Short MRN: [id number] Date of Birth: [birth year]  Today's Date: 07/03/2020 OT Individual Time: 1530-1615 OT Individual Time Calculation (min): 45 min    Short Term Goals: Week 2:  OT Short Term Goal 1 (Week 2): Pt will donn/doff socks using AE as needed with min assist OT Short Term Goal 2 (Week 2): Pt will complete walk in shower with 2 inch threshold step over transfer with CGA OT Short Term Goal 3 (Week 2): Pt will achieve 15 degrees right active wrist extension. OT Short Term Goal 4 (Week 2): Pt will donn pants and brief with CGA.  Skilled Therapeutic Interventions/Progress Updates:    Pt sitting up in w/c, no c/o pain.  OT session focused on functional transfers, donning/doffing shoewear with AE, and assessment of RUE strength.  Pt transported to ADL suite via w/c for time mgt.  Educated pt through VCs and visual demo safe shower transfer with 2 inch threshold side step over technique.  Pt return demonstrated with CGA and use of RW.  Pt educated on use of reacher and long handled shoe horn for increased independence donning and doffing shoes.  Pt doffed socks and shoes with supervision and donned socks with min assist and shoes with supervision.  Assessed RUE strength and as follows: shoulder FF/ABD:3-/5, ER: 2+/5, IR: 3+/5; elbow flex/ext: 3+/5; FA sup 2-/5 pro 3/5; wrist ext: 2-/5 flex 3/5; digital flex 3/5, ext: 2-/5; thumb abd: 1/5, IP flex: 2-/5.  Pt is improving RUE strength steadily.  Call bell in reach, seat belt alarm on.  Therapy Documentation Precautions:  Precautions Precautions: Fall Precaution Comments: R hemiparesis UE>LE Restrictions Weight Bearing Restrictions: No   Therapy/Group: Individual Therapy  [name] 07/03/2020, 5:22 PM

## 2020-07-03 NOTE — Progress Notes (Signed)
PHYSICAL MEDICINE & REHABILITATION PROGRESS NOTE   Subjective/Complaints: Patient seen sitting up in a chair this point.  He states he slept well overnight.  He states he had a good weekend.  He states he enjoyed his day of rest yesterday, watching football.  ROS: Denies CP, SOB, N/V/D  Objective:   No results found. Recent Labs    07/03/20 0752  WBC 11.2*  HGB 12.9*  HCT 39.6  PLT 369   Recent Labs    07/03/20 0708  NA 137  K 4.0  CL 104  CO2 23  GLUCOSE 142*  BUN 9  CREATININE 0.75  CALCIUM 8.8*    Intake/Output Summary (Last 24 hours) at 07/03/2020 0847 Last data filed at 07/03/2020 0717 Gross per 24 hour  Intake 1200 ml  Output 650 ml  Net 550 ml        Physical Exam: Vital Signs Blood pressure (!) 157/97, pulse 80, temperature 99.3 F (37.4 C), temperature source Oral, resp. rate 18, height 6\' 4"  (1.93 m), weight 83.9 kg, SpO2 100 %.  Constitutional: No distress . Vital signs reviewed. HENT: Normocephalic.  Atraumatic. Eyes: EOMI. No discharge. Cardiovascular: No JVD.  RRR. Respiratory: Normal effort.  No stridor.  Bilateral clear to auscultation. GI: Non-distended.  BS +. Skin: Warm and dry.  Intact. Psych: Normal mood.  Normal behavior. Musc: No edema in extremities.  No tenderness in extremities. Neuro: Alert Mild dysarthria, no change Good awareness of deficits.  Right facial droop, stable Motor:  RUE shoulder abduction 4/5, elbow flexion/extension 4/5, wrist extension 1/5, handgrip 0/5 RLE: 4+-5/5 proximal to distal  Assessment/Plan: 1. Functional deficits secondary to left corona radiata infarct which require 3+ hours per day of interdisciplinary therapy in a comprehensive inpatient rehab setting.  Physiatrist is providing close team supervision and 24 hour management of active medical problems listed below.  Physiatrist and rehab team continue to assess barriers to discharge/monitor patient progress toward functional and  medical goals  Care Tool:  Bathing    Body parts bathed by patient: Right arm, Chest, Abdomen, Front perineal area, Buttocks, Right upper leg, Left upper leg, Face, Left arm, Left lower leg, Right lower leg   Body parts bathed by helper: Left arm Body parts n/a: Right lower leg, Left lower leg   Bathing assist Assist Level: Contact Guard/Touching assist     Upper Body Dressing/Undressing Upper body dressing   What is the patient wearing?: Pull over shirt    Upper body assist Assist Level: Set up assist    Lower Body Dressing/Undressing Lower body dressing      What is the patient wearing?: Pants, Incontinence brief     Lower body assist Assist for lower body dressing: Minimal Assistance - Patient > 75%     Toileting Toileting    Toileting assist Assist for toileting: Contact Guard/Touching assist Assistive Device Comment: bedpan   Transfers Chair/bed transfer  Transfers assist     Chair/bed transfer assist level: Minimal Assistance - Patient > 75% Chair/bed transfer assistive device: Armrests   Locomotion Ambulation   Ambulation assist      Assist level: Minimal Assistance - Patient > 75% Assistive device: Walker-rolling Max distance: 200   Walk 10 feet activity   Assist     Assist level: Minimal Assistance - Patient > 75% Assistive device: Walker-rolling   Walk 50 feet activity   Assist    Assist level: Minimal Assistance - Patient > 75% Assistive device: Walker-rolling    Walk 150  feet activity   Assist Walk 150 feet activity did not occur: Safety/medical concerns (fatigue after 54ft)  Assist level: Minimal Assistance - Patient > 75% Assistive device: Walker-rolling    Walk 10 feet on uneven surface  activity   Assist Walk 10 feet on uneven surfaces activity did not occur: Safety/medical concerns         Wheelchair     Assist Will patient use wheelchair at discharge?: No Type of Wheelchair: Manual    Wheelchair  assist level: Minimal Assistance - Patient > 75% Max wheelchair distance: 76' using L extremities.    Wheelchair 50 feet with 2 turns activity    Assist        Assist Level: Minimal Assistance - Patient > 75%   Wheelchair 150 feet activity     Assist          Medical Problem List and Plan: 1. Right side hemiparesis secondary to left corona radiata infarct secondary small vessel disease  Continue CIR  WHO don nightly 2.  Antithrombotics: -DVT/anticoagulation: Lovenox- d/c as ambulating >150 feet             -antiplatelet therapy: Current plan is for aspirin 81 mg daily and Brilinta 90 mg twice daily x30 days then transition back to aspirin and Plavix 3. Pain Management:   Controlled on 10/11 4. Mood: Provide emotional support             -antipsychotic agents: N/A 5. Neuropsych: This patient is capable of making decisions on his own behalf. 6. Skin/Wound Care: Routine skin checks 7. Fluids/Electrolytes/Nutrition: Routine in and outs. 8.  CAD with angioplasty.  Continue aspirin. 9.  Diabetes mellitus with peripheral neuropathy.  Hemoglobin A1c 6.9.  Glucophage 1000 mg at breakfast 1500 mg with supper.    Glipizide 2.5 started on 10/1  Slightly elevated on 10/11  Monitor with increased mobility 10.  Hyperlipidemia.  Lipitor 11.  Tobacco abuse: Patient states that he has quit.  DCed nicotine patch. 12.  Hypertension.  Cozaar 25 mg daily, increased to 50 on 10/3, increased to 75 on 10/7, increased to 100 on 10/11  Monitor with increased mobility. 13.  History of AAA without rupture.  Follow-up outpatient monitoring.  14.  Hyponatremia  Sodium 137 on 10/11  10/9- check weekly  Continue to monitor 15.  Hypoalbuminemia  Supplement initiated on 9/28 16.  Leukocytosis:   WBCs 11.2 on 10/11  Afebrile  Continue to monitor 17.  Mild acute blood loss anemia, stabilizing  Hemoglobin 12.9 on 10/11  Continue to monitor  LOS: 14 days A FACE TO FACE EVALUATION WAS  PERFORMED  Jacob Short Jacob Short 07/03/2020, 8:47 AM

## 2020-07-04 ENCOUNTER — Inpatient Hospital Stay (HOSPITAL_COMMUNITY): Payer: PPO

## 2020-07-04 ENCOUNTER — Inpatient Hospital Stay (HOSPITAL_COMMUNITY): Payer: PPO | Admitting: Occupational Therapy

## 2020-07-04 DIAGNOSIS — E1165 Type 2 diabetes mellitus with hyperglycemia: Secondary | ICD-10-CM

## 2020-07-04 LAB — GLUCOSE, CAPILLARY: Glucose-Capillary: 171 mg/dL — ABNORMAL HIGH (ref 70–99)

## 2020-07-04 NOTE — Progress Notes (Signed)
Physical Therapy Weekly Progress Note  Patient Details  Name: Jacob Short MRN: [id number] Date of Birth: 10-13-1945  Beginning of progress report period: June 27, 2020 End of progress report period: July 04, 2020  Today's Date: 07/04/2020 PT Individual Time: 7121-9758 + 8325-4982 PT Individual Time Calculation (min): 57 min  + 28 min  Patient has met 3 of 4 short term goals.  Pt continuing to make great progress during his rehabilitation stay. He shows strong motivation to return function in R hemibody. He now is able to perform bed mobility with CGA, stand<>pivot transfers with CGA, and has ambulated >137f fluctuating b/w CGA and minA with RW. He has also ambulated 1,2617fon LiteGait with treadmill and minimal body-weight support. He has improved his BERG from 31/56 on 10/7 to 38/56 on 10/12, score still indicative of increased falls risk. He continues to be primarily limited by RLE apraxia, RUE weakness, and impaired balance.  Patient continues to demonstrate the following deficits muscle weakness, impaired timing and sequencing, unbalanced muscle activation, motor apraxia and decreased coordination and decreased motor planning and therefore will continue to benefit from skilled PT intervention to increase functional independence with mobility.  Patient progressing toward long term goals.  Continue plan of care.  PT Short Term Goals Week 2:  PT Short Term Goal 1 (Week 2): Pt will perform bed mobility with CGA PT Short Term Goal 2 (Week 2): Pt will perform bed<>chair transfer with CGA and LRAD PT Short Term Goal 3 (Week 2): Pt will ambulate at least 1004fith CGA and LRAD PT Short Term Goal 4 (Week 2): Pt will navigate up.down x4 steps with CGA and LRAD Week 3:  PT Short Term Goal 1 (Week 3): LTG = STG due to ELOS  Skilled Therapeutic Interventions/Progress Updates:     1st session: Pt received sitting in w/c, agreeable to PT session; denies any pain. Focus of session to  continue gait training and RUE strengthening. Confirmed with scheduling for Family Ed tomorrow with wife, from [id number] made aware to communicate with his wife. WC transport for time management from his room to dayroom gym. Sit<>stand with CGA to LiteGait and donned LiteGait harness while standing. He performed variable interval gait training on LiteGait with following parameters with minimal to no body weight support: -6 min, 0.9 mph, 400f34f min, 0.9 mph, 305ft45fl 2 incline -5 min, 0.4 mph, 300ft,71fupported with BUE -4 min, 0.9 mph, 268ft, 44fping over obstacle with RLE.   Only brief standing rest breaks during entire LiteGait session while changing parameters, otherwise remained ambulating throughout. Removed LiteGait harness while standing with CGA, stand>sit with CGA to w/c. WC transport to main therapy gym to finish session with RUE strengthening. Performed the following seated there-ex for RUE strengthening: -2x10 AAROM wrist ext in gravity eliminated position -2x10 AAROM elbow flex/ext -2x10 AAROM scapular flexion to 100deg -1x10 w/c pushups   WC transport back to his room for time management where he remained seated in w/c at end of session with needs in reach.  2nd session: Pt received sitting in w/c, agreeable to PT session, denies any pain. WC transport from his room to main therapy gym for time management. Focus of session to perform balance training and rescore BERG. He completed the BERG with results described below. He has improved his score by 7 points; he scored 31/56 on 10/7 and today he scored 38/56. Score indicative of increased falls risk. His score was mostly impacted by unsupported stepping on  stool, 360deg turns, tandem stance, and use of hands for transfers. He was returned to his room where he then reported need to void. Therefore, he ambulated short distances within his room with CGA and no AD, remained standing to void where he was continent of bladder  (charted), and stand>sit with CGA, sit>Supine with CGA as well. He remained semi-reclined in bed with needs in reach at end of session, made comfortable.  Patient demonstrates increased fall risk as noted by score of   /56 on Berg Balance Scale.  (<36= high risk for falls, close to 100%; 37-45 significant >80%; 46-51 moderate >50%; 52-55 lower >25%)   Therapy Documentation Precautions:  Precautions Precautions: Fall Precaution Comments: R hemiparesis UE>LE Restrictions Weight Bearing Restrictions: No  Balance: Balance Balance Assessed: Yes Standardized Balance Assessment Standardized Balance Assessment: Berg Balance Test Berg Balance Test Sit to Stand: Able to stand  independently using hands Standing Unsupported: Able to stand 2 minutes with supervision Sitting with Back Unsupported but Feet Supported on Floor or Stool: Able to sit safely and securely 2 minutes Stand to Sit: Sits safely with minimal use of hands Transfers: Able to transfer safely, definite need of hands Standing Unsupported with Eyes Closed: Able to stand 10 seconds with supervision Standing Ubsupported with Feet Together: Able to place feet together independently and stand for 1 minute with supervision From Standing, Reach Forward with Outstretched Arm: Can reach forward >12 cm safely (5") From Standing Position, Pick up Object from Floor: Able to pick up shoe, needs supervision From Standing Position, Turn to Look Behind Over each Shoulder: Looks behind from both sides and weight shifts well Turn 360 Degrees: Able to turn 360 degrees safely but slowly Standing Unsupported, Alternately Place Feet on Step/Stool: Needs assistance to keep from falling or unable to try Standing Unsupported, One Foot in Front: Able to plae foot ahead of the other independently and hold 30 seconds Standing on One Leg: Unable to try or needs assist to prevent fall Total Score: 38  Therapy/Group: Individual Therapy  Sheilla Maris P Kipper Buch  PT 07/04/2020, 7:42 AM

## 2020-07-04 NOTE — Progress Notes (Signed)
Physical Therapy Session Note  Patient Details  Name: Jacob Short MRN: [id number] Date of Birth: 22-Mar-1946  Today's Date: 07/04/2020 PT Individual Time: 1025-1110 PT Individual Time Calculation (min): 45 min   Short Term Goals: Week 1:  PT Short Term Goal 1 (Week 1): Pt will perform bed mobility with minA PT Short Term Goal 2 (Week 1): Pt will perform bed<>chair transfer with minA and LRAD PT Short Term Goal 3 (Week 1): Pt will ambulate at least 35ft with minA and LRAD PT Short Term Goal 4 (Week 1): Pt will navigate up/down at least 4 steps with minA and LRAD Week 2:  PT Short Term Goal 1 (Week 2): Pt will perform bed mobility with CGA PT Short Term Goal 2 (Week 2): Pt will perform bed<>chair transfer with CGA and LRAD PT Short Term Goal 3 (Week 2): Pt will ambulate at least 161ft with CGA and LRAD PT Short Term Goal 4 (Week 2): Pt will navigate up.down x4 steps with CGA and LRAD Week 3:  PT Short Term Goal 1 (Week 3): LTG = STG due to ELOS  Skilled Therapeutic Interventions/Progress Updates:    PAIN denies pain  Pt initially oob in wc and agreeable to session w/focus on NMRE, balance, gait cadence and pattern.  Pt propells wc w/bilat LEs x 129ft w/cues to attend to RLE w/task/equal "step length".  Pt educated re use of maxiski for dynamic balance training.  Discussed purpose of each activity as session progressed. STS w/supervision for therapist to don harness.  The following activities were performed for balance, coordination, gait, global strength, Rexts NMRE.  Gait w/progressively increasing cadence which results in decreased coordination of advancement RLE but improvement w/repititiion 58ft/8 passes  Standing squeezing and bouncing then squeezing and tossing ball w/therapist for dual UE task/coordination/balance  Standing kicking ball progressing to stepping and kicking slow rolling ball w/mult episodes of balance loss primarily occurring to R w/delayed stepping stragey  noted.    Seated rest following 25 min continuous activity.  Gait as above 8 passes.  Gait Stepping over objects w/RLE (weights placed at step length) to encourage increased clearance RLE w/gait.  Course x 6 passes>165feet total  Pt maintains standing w/supervision for removeal of harness.  Stand to sit w supervision.    Propells wc to room >12ft using bilat LEs as above.  SPT wc to bed w/supervision.  Sit to supine w/cues, supervision.  Therapy Documentation Precautions:  Precautions Precautions: Fall Precaution Comments: R hemiparesis UE>LE Restrictions Weight Bearing Restrictions: No   Therapy/Group: Individual Therapy  Callie Fielding, PT   Jerrilyn Cairo 07/04/2020, 12:46 PM

## 2020-07-04 NOTE — Progress Notes (Signed)
[facility] PHYSICAL MEDICINE & REHABILITATION PROGRESS NOTE   Subjective/Complaints: Patient seen sitting up in a c patient seen standing, working with therapy this morning working on ambulation.  He states he slept well overnight.  He is in good spirits.  ROS: Denies CP, SOB, N/V/D  Objective:   No results found. Recent Labs    07/03/20 0752  WBC 11.2*  HGB 12.9*  HCT 39.6  PLT 369   Recent Labs    07/03/20 0708  NA 137  K 4.0  CL 104  CO2 23  GLUCOSE 142*  BUN 9  CREATININE 0.75  CALCIUM 8.8*    Intake/Output Summary (Last 24 hours) at 07/04/2020 0854 Last data filed at 07/04/2020 0819 Gross per 24 hour  Intake 840 ml  Output 650 ml  Net 190 ml        Physical Exam: Vital Signs Blood pressure 134/78, pulse 80, temperature 99.9 F (37.7 C), temperature source Oral, resp. rate 18, height 6\' 4"  (1.93 m), weight 80.2 kg, SpO2 99 %.  Constitutional: No distress . Vital signs reviewed. HENT: Normocephalic.  Atraumatic. Eyes: EOMI. No discharge. Cardiovascular: No JVD.  RRR. Respiratory: Normal effort.  No stridor.  Bilateral clear to auscultation. GI: Non-distended.  BS +. Skin: Warm and dry.  Intact. Psych: Normal mood.  Normal behavior. Musc: No edema in extremities.  No tenderness in extremities. Neuro: Alert Mild dysarthria, stable Good awareness of deficits.  Right facial droop, unchanged Motor:  RUE shoulder abduction 4/5, elbow flexion/extension 4/5, wrist extension 1/5, handgrip 0/5 RLE: 4+-5/5 proximal to distal  Assessment/Plan: 1. Functional deficits secondary to left corona radiata infarct which require 3+ hours per day of interdisciplinary therapy in a comprehensive inpatient rehab setting.  Physiatrist is providing close team supervision and 24 hour management of active medical problems listed below.  Physiatrist and rehab team continue to assess barriers to discharge/monitor patient progress toward functional and medical goals  Care  Tool:  Bathing    Body parts bathed by patient: Right arm, Chest, Abdomen, Front perineal area, Buttocks, Right upper leg, Left upper leg, Face, Left arm, Left lower leg, Right lower leg   Body parts bathed by helper: Left arm Body parts n/a: Right lower leg, Left lower leg   Bathing assist Assist Level: Contact Guard/Touching assist     Upper Body Dressing/Undressing Upper body dressing   What is the patient wearing?: Pull over shirt    Upper body assist Assist Level: Set up assist    Lower Body Dressing/Undressing Lower body dressing      What is the patient wearing?: Pants, Incontinence brief     Lower body assist Assist for lower body dressing: Minimal Assistance - Patient > 75%     Toileting Toileting    Toileting assist Assist for toileting: Contact Guard/Touching assist Assistive Device Comment: bedpan   Transfers Chair/bed transfer  Transfers assist     Chair/bed transfer assist level: Minimal Assistance - Patient > 75% Chair/bed transfer assistive device: Armrests   Locomotion Ambulation   Ambulation assist      Assist level: Minimal Assistance - Patient > 75% Assistive device: Walker-rolling Max distance: 200   Walk 10 feet activity   Assist     Assist level: Minimal Assistance - Patient > 75% Assistive device: Walker-rolling   Walk 50 feet activity   Assist    Assist level: Minimal Assistance - Patient > 75% Assistive device: Walker-rolling    Walk 150 feet activity   Assist Walk 150  feet activity did not occur: Safety/medical concerns (fatigue after 46ft)  Assist level: Minimal Assistance - Patient > 75% Assistive device: Walker-rolling    Walk 10 feet on uneven surface  activity   Assist Walk 10 feet on uneven surfaces activity did not occur: Safety/medical concerns         Wheelchair     Assist Will patient use wheelchair at discharge?: No Type of Wheelchair: Manual    Wheelchair assist level: Minimal  Assistance - Patient > 75% Max wheelchair distance: 71' using L extremities.    Wheelchair 50 feet with 2 turns activity    Assist        Assist Level: Minimal Assistance - Patient > 75%   Wheelchair 150 feet activity     Assist          Medical Problem List and Plan: 1. Right side hemiparesis secondary to left corona radiata infarct secondary small vessel disease  Continue CIR  WHO don nightly 2.  Antithrombotics: -DVT/anticoagulation: Lovenox- d/c as ambulating >150 feet             -antiplatelet therapy: Current plan is for aspirin 81 mg daily and Brilinta 90 mg twice daily x30 days then transition back to aspirin and Plavix 3. Pain Management:   Controlled on 10/12 4. Mood: Provide emotional support             -antipsychotic agents: N/A 5. Neuropsych: This patient is capable of making decisions on his own behalf. 6. Skin/Wound Care: Routine skin checks 7. Fluids/Electrolytes/Nutrition: Routine in and outs. 8.  CAD with angioplasty.  Continue aspirin. 9.  Diabetes mellitus with hyperglycemia and peripheral neuropathy.  Hemoglobin A1c 6.9.  Glucophage 1000 mg at breakfast 1500 mg with supper.    Glipizide 2.5 started on 10/1  Elevated this a.m., no recordings over the last 24 hours  Monitor with increased mobility 10.  Hyperlipidemia.  Lipitor 11.  Tobacco abuse: Patient states that he has quit.  DCed nicotine patch. 12.  Hypertension.  Cozaar 25 mg daily, increased to 50 on 10/3, increased to 75 on 10/7, increased to 100 on 10/11  Appears to be improving on 10/12  Monitor with increased mobility. 13.  History of AAA without rupture.  Follow-up outpatient monitoring.  14.  Hyponatremia  Sodium 137 on 10/11  10/9- check weekly  Continue to monitor 15.  Hypoalbuminemia  Supplement initiated on 9/28 16.  Leukocytosis:   WBCs 11.2 on 10/11  Afebrile, unchanged  Continue to monitor 17.  Mild acute blood loss anemia, stabilizing  Hemoglobin 12.9 on  10/11  Continue to monitor  LOS: 15 days A FACE TO FACE EVALUATION WAS PERFORMED  Jacob Short Jacob Short 07/04/2020, 8:54 AM

## 2020-07-04 NOTE — Progress Notes (Signed)
Occupational Therapy Session Note  Patient Details  Name: Jacob Short MRN: [id number] Date of Birth: 11-13-1945  Today's Date: 07/04/2020 OT Individual Time: 1340-1440 OT Individual Time Calculation (min): 60 min    Short Term Goals: Week 2:  OT Short Term Goal 1 (Week 2): Pt will donn/doff socks using AE as needed with min assist OT Short Term Goal 2 (Week 2): Pt will complete walk in shower with 2 inch threshold step over transfer with CGA OT Short Term Goal 3 (Week 2): Pt will achieve 15 degrees right active wrist extension. OT Short Term Goal 4 (Week 2): Pt will donn pants and brief with CGA.  Skilled Therapeutic Interventions/Progress Updates:    Pt supine in bed, no c/o pain, requesting to work on strength and neuro re-ed of his RUE.  Pt completed supine to sit with supervision.  Pt ambulated EOB to w/c using RW with CGA.  Pt transported to large gym for time mgt.  Pt completed SPT using RW with CGA w/c to EOM.  Pt completed the following therex RUE:  Seated shoulder shrugs and scapular retraction, supine scapular protraction/retraction and shoulder FF, sidelying ER and abduction.  Each exercise completed 3 x 10 reps except 3 x 15 of shoulder shrugs and retraction.  SPT EOM to w/c with CGA using RW.  Pt completed tabletop neuro re-ed including wrist flex/ext, digital flex/ext, and thumb palmar abduction requiring TC's and AAROM to facilitate full range.  Unattened NMES completed to right thumb palmar ABD to increase strength and motor learning.  Pt transported back to room, call bell in reach, seat belt alarm on.   Therapy Documentation Precautions:  Precautions Precautions: Fall Precaution Comments: R hemiparesis UE>LE Restrictions Weight Bearing Restrictions: No   Therapy/Group: Individual Therapy  [name] 07/04/2020, 4:48 PM

## 2020-07-05 ENCOUNTER — Inpatient Hospital Stay (HOSPITAL_COMMUNITY): Payer: PPO

## 2020-07-05 ENCOUNTER — Encounter (HOSPITAL_COMMUNITY): Payer: PPO | Admitting: Occupational Therapy

## 2020-07-05 ENCOUNTER — Ambulatory Visit (HOSPITAL_COMMUNITY): Payer: PPO

## 2020-07-05 DIAGNOSIS — R7309 Other abnormal glucose: Secondary | ICD-10-CM

## 2020-07-05 LAB — GLUCOSE, CAPILLARY: Glucose-Capillary: 103 mg/dL — ABNORMAL HIGH (ref 70–99)

## 2020-07-05 NOTE — Progress Notes (Signed)
Physical Therapy Session Note  Patient Details  Name: Jacob Short MRN: [id number] Date of Birth: 24-Mar-1946  Today's Date: 07/05/2020 PT Individual Time: 0900-1000 + 1130-1230 + 1400-1500 PT Individual Time Calculation (min): 60 min + 30 min + 60 min  Short Term Goals: Week 3:  PT Short Term Goal 1 (Week 3): LTG = STG due to ELOS  Skilled Therapeutic Interventions/Progress Updates:   1st session:   Pt received sitting in w/c, agreeable to PT session; denies any pain and motivated to work with therapy. WC transport for time management from his room to outside near reflection water fountain. Focus of session to practice outdoor ambulation and RUE strengthening. He ambulated 2x~251ft (seated rest break) with CGA (intermittent mild minA during turns) and RW on unlevel pavement outdoors. Cues for increasing R knee extension in terminal stance, increasing cadence, and widening BOS. He then performed the following seated there-ex for RUE strengthening: -2x10 AAROM (end range) scapular flexion with level 2 (orange) theraband -2x10 AAROM (end range) shldr abduction with level 2 (orange) theraband -2x10 AAROM tricep extensions with level 2 (orange) theraband -2x10 AROM with weightless dowel rod (RUE ace wrapped) shldr flex to 90 deg -2x10 AROM with weightless dowel rod (RUE ace wrapped) FA supination/pronation -2x10 lat pull down isometrics with dowel rod and therapist providing resistance *Brief rest breaks provided b/w sets for recovery  WC transport back to his room with totalA for time management, he remained seated in w/c at end of session. Needs in reach.  2nd session: Pt received siting in w/c, agreeable to PT session, denies pain. WC transport for time management to main hallway. Trial of gait with rollator to assess appropriateness for LRAD. He ambulated ~131ft with CGA and rollator on level surfaces, Difficulty maintaining RUE grip to rollator and increased unsteadiness with turns. Pt  reports he prefer's RW rather than rollator for added stability. He then performed dynamic standing balance with 2inch threshold, stepping over forwards/backwards/sideways with no AD, requiring minA for trunk stability. Pt with increased difficulty clearing R foot over threshold, especially with lateral stepping. WC transport back to his room, stand<>step transfer with CGA and no AD from w/c to EOB. Sit>supine with supervision with HOB slightly elevated, using bed rail. He remained semi-reclined in bed at end of session, bed alarm on, needs in reach.  3rd session: Pt received sitting in w/c, agreeable to PT session, denies pain. Focus of session family education and training with his wife. Wife at bedside. Extensive and lengthy conversation held with both pt and wife regarding home safety training. This included initial 24/7 S/A, removal of clutter for clear paths in home, strategic placement of chairs around the house for energy conservation, bed rail to assist with bed mobility, urinals in both bedroom and living room to avoid urgent rushing, fall prevention strategies, grab bars in bathroom, night lights in various placements in home, DME rec's, and role of f/u therapy services. All questions and concerns addressed from both pt and wife.   Sit<>stand with CGA from w/c to RW. He ambulated from his room to main therapy gym with CGA and RW, wife present and educated on guarding and hand placement. He navigated up/down x4 steps with CGA and B HR support, wife present and informed of guarding and proper sequencing. She reports a 2nd HR will be installed prior to his DC. He ambulated back to his room with CGA and RW, stand>sit with CGA to w/c. HE remained seated in w/c at end of session  with needs in reach, wife at bedside. Both understanding and in agreement with DC plan.    Therapy Documentation Precautions:  Precautions Precautions: Fall Precaution Comments: R hemiparesis UE>LE Restrictions Weight  Bearing Restrictions: No  Therapy/Group: Individual Therapy  Ferlin Fairhurst P Trai Ells PT 07/05/2020, 9:57 AM

## 2020-07-05 NOTE — Progress Notes (Signed)
Patient ID: Jacob Short, male   DOB: Jul 18, 1946, 74 y.o.   MRN: [id number]  Met with pt to discuss team conference progress toward his goals and discharge still 10/19. He feels he is making progress and can see it. His wife is coming in at 1:00 pm for education today. Discussed follow up and he would like to go to the OP he was at [place]. Will begin the referral process and ask to contact wife to set up appointments so can work around her work schedule.

## 2020-07-05 NOTE — Progress Notes (Addendum)
[facility] PHYSICAL MEDICINE & REHABILITATION PROGRESS NOTE   Subjective/Complaints: Patient seen sitting up in his chair this morning.  He states he slept well overnight.  He notes he had some spasms overnight due to significant exercise yesterday, but they resolved with medication.  ROS: Denies CP, SOB, N/V/D  Objective:   No results found. Recent Labs    07/03/20 0752  WBC 11.2*  HGB 12.9*  HCT 39.6  PLT 369   Recent Labs    07/03/20 0708  NA 137  K 4.0  CL 104  CO2 23  GLUCOSE 142*  BUN 9  CREATININE 0.75  CALCIUM 8.8*    Intake/Output Summary (Last 24 hours) at 07/05/2020 0920 Last data filed at 07/05/2020 0818 Gross per 24 hour  Intake 702 ml  Output 900 ml  Net -198 ml        Physical Exam: Vital Signs Blood pressure (!) 135/92, pulse 78, temperature 98.2 F (36.8 C), temperature source Oral, resp. rate 18, height 6\' 4"  (1.93 m), weight 81.5 kg, SpO2 98 %.  Constitutional: No distress . Vital signs reviewed. HENT: Normocephalic.  Atraumatic. Eyes: EOMI. No discharge. Cardiovascular: No JVD.  RRR. Respiratory: Normal effort.  No stridor.  Bilateral clear to auscultation. GI: Non-distended.  BS +. Skin: Warm and dry.  Intact. Psych: Normal mood.  Normal behavior. Musc: No edema in extremities.  No tenderness in extremities. Neuro: Alert Mild dysarthria, unchanged Good awareness of deficits.  Right facial droop, unchanged Motor:  RUE shoulder abduction 4/5, elbow flexion/extension 4/5, wrist extension 2/5, handgrip 1+/5 RLE: 4+-5/5 proximal to distal  Assessment/Plan: 1. Functional deficits secondary to left corona radiata infarct which require 3+ hours per day of interdisciplinary therapy in a comprehensive inpatient rehab setting.  Physiatrist is providing close team supervision and 24 hour management of active medical problems listed below.  Physiatrist and rehab team continue to assess barriers to discharge/monitor patient progress toward  functional and medical goals  Care Tool:  Bathing    Body parts bathed by patient: Right arm, Chest, Abdomen, Front perineal area, Buttocks, Right upper leg, Left upper leg, Face, Left arm, Left lower leg, Right lower leg   Body parts bathed by helper: Left arm Body parts n/a: Right lower leg, Left lower leg   Bathing assist Assist Level: Contact Guard/Touching assist     Upper Body Dressing/Undressing Upper body dressing   What is the patient wearing?: Pull over shirt    Upper body assist Assist Level: Set up assist    Lower Body Dressing/Undressing Lower body dressing      What is the patient wearing?: Pants, Incontinence brief     Lower body assist Assist for lower body dressing: Minimal Assistance - Patient > 75%     Toileting Toileting    Toileting assist Assist for toileting: Contact Guard/Touching assist Assistive Device Comment: bedpan   Transfers Chair/bed transfer  Transfers assist     Chair/bed transfer assist level: Supervision/Verbal cueing Chair/bed transfer assistive device: Armrests   Locomotion Ambulation   Ambulation assist      Assist level: Minimal Assistance - Patient > 75% Assistive device: Walker-rolling Max distance: 200   Walk 10 feet activity   Assist     Assist level: Minimal Assistance - Patient > 75% Assistive device: Walker-rolling   Walk 50 feet activity   Assist    Assist level: Minimal Assistance - Patient > 75% Assistive device: Walker-rolling    Walk 150 feet activity   Assist Walk 150  feet activity did not occur: Safety/medical concerns (fatigue after 66ft)  Assist level: Minimal Assistance - Patient > 75% Assistive device: Walker-rolling    Walk 10 feet on uneven surface  activity   Assist Walk 10 feet on uneven surfaces activity did not occur: Safety/medical concerns         Wheelchair     Assist Will patient use wheelchair at discharge?: No Type of Wheelchair: Manual     Wheelchair assist level: Minimal Assistance - Patient > 75% Max wheelchair distance: 60' using L extremities.    Wheelchair 50 feet with 2 turns activity    Assist        Assist Level: Minimal Assistance - Patient > 75%   Wheelchair 150 feet activity     Assist          Medical Problem List and Plan: 1. Right side hemiparesis secondary to left corona radiata infarct secondary small vessel disease  Continue CIR  Team conference today to discuss current and goals and coordination of care, home and environmental barriers, and discharge planning with nursing, case manager, and therapies. Please see conference note from today as well.   WHO don nightly 2.  Antithrombotics: -DVT/anticoagulation: Lovenox- d/c as ambulating >150 feet             -antiplatelet therapy: Current plan is for aspirin 81 mg daily and Brilinta 90 mg twice daily x30 days then transition back to aspirin and Plavix 3. Pain Management:   Robaxin PRN  Controlled on 10/13 4. Mood: Provide emotional support             -antipsychotic agents: N/A 5. Neuropsych: This patient is capable of making decisions on his own behalf. 6. Skin/Wound Care: Routine skin checks 7. Fluids/Electrolytes/Nutrition: Routine in and outs. 8.  CAD with angioplasty.  Continue aspirin. 9.  Diabetes mellitus with hyperglycemia and peripheral neuropathy.  Hemoglobin A1c 6.9.  Glucophage 1000 mg at breakfast 1500 mg with supper.    Glipizide 2.5 started on 10/1  Slightly labile on 10/13  Monitor with increased mobility 10.  Hyperlipidemia.  Lipitor 11.  Tobacco abuse: Patient states that he has quit.  DCed nicotine patch. 12.  Hypertension.  Cozaar 25 mg daily, increased to 50 on 10/3, increased to 75 on 10/7, increased to 100 on 10/11  Slightly labile on 10/13  Monitor with increased mobility. 13.  History of AAA without rupture.  Follow-up outpatient monitoring.  14.  Hyponatremia  Sodium 137 on 10/11  Continue to  monitor 15.  Hypoalbuminemia  Supplement initiated on 9/28 16.  Leukocytosis:   WBCs 11.2 on 10/11  Afebrile, unchanged  Continue to monitor 17.  Mild acute blood loss anemia, stabilizing  Hemoglobin 12.9 on 10/11  Continue to monitor  LOS: 16 days A FACE TO FACE EVALUATION WAS PERFORMED  Annaleah Arata Lorie Phenix 07/05/2020, 9:20 AM

## 2020-07-05 NOTE — Progress Notes (Signed)
Occupational Therapy Session Note  Patient Details  Name: Jacob Short MRN: [id number] Date of Birth: March 07, 1946  Today's Date: 07/05/2020 OT Individual Time: 1300-1400 OT Individual Time Calculation (min): 60 min    Short Term Goals: Week 2:  OT Short Term Goal 1 (Week 2): Pt will donn/doff socks using AE as needed with min assist OT Short Term Goal 2 (Week 2): Pt will complete walk in shower with 2 inch threshold step over transfer with CGA OT Short Term Goal 3 (Week 2): Pt will achieve 15 degrees right active wrist extension. OT Short Term Goal 4 (Week 2): Pt will donn pants and brief with CGA.  Skilled Therapeutic Interventions/Progress Updates:    Pt sitting up in w/c, no c/o pain, wife present throughout session for family education.  Educated pts wife regarding pts current functional status for bathing and dressing.  Pt demonstrated donning/doffing shoes with setup and socks with min assist to donn with wifes help. Educated wife on body mechanics to reduce back strain.  Pt completed shower threshold transfer simulation stepping over 2 inch threshold laterally left and right using RW needing CGA.  Pts wife provided CGA second trial safely with no LOB noted of pt.  Pt requesting to use bathroom.  Wife demonstrated good technique providing CGA while pt ambulated to toilet and completed toilet transfer using RW.  Educated wife on pts assist level for toileting of min assist for brief mgt and CGA for pericare and pulling pants over hips.  Pt had continent episode of bowel and bladder.  Pts wife provided CGA while pt ambulated back to w/c.  Pt left with PT arrival.    Therapy Documentation Precautions:  Precautions Precautions: Fall Precaution Comments: R hemiparesis UE>LE Restrictions Weight Bearing Restrictions: No   Therapy/Group: Individual Therapy  [name] 07/05/2020, 4:18 PM

## 2020-07-05 NOTE — Patient Care Conference (Signed)
Inpatient RehabilitationTeam Conference and Plan of Care Update Date: 07/05/2020   Time: 11:17 AM    Patient Name: Jacob Short [facility]      Medical Record Number: [id number]  Date of Birth: December 12, 1945 Sex: Male         Room/Bed: 4M05C/4M05C-01 Payor Info: Payor: [employer] / Plan: [employer] PPO / Product Type: *No Product type* /    Admit Date/Time:  06/19/2020  5:22 PM  Primary Diagnosis:  Small vessel disease, cerebrovascular  Hospital Problems: Principal Problem:   Small vessel disease, cerebrovascular Active Problems:   Leukocytosis   Hypoalbuminemia due to protein-calorie malnutrition (HCC)   Hyponatremia   Essential hypertension   Diabetic peripheral neuropathy (HCC)   Right hemiparesis (HCC)   Muscle spasm   Labile blood pressure   Acute blood loss anemia   Controlled type 2 diabetes mellitus with hyperglycemia, without long-term current use of insulin ([place])   Labile blood glucose    Expected Discharge Date: Expected Discharge Date: 07/11/20  Team Members Present: Physician leading conference: Dr. Delice Lesch Care Coodinator Present: Dorien Chihuahua, RN, BSN, CRRN;Other (comment) Jacqlyn Larsen [place], SW) Nurse Present: Other (comment) Annita Brod, LPN) PT Present: Other (comment) (Christain Manhard, PT) OT Present: Leretha Pol, OT PPS Coordinator present : Roosevelt Locks, PT     Current Status/Progress Goal Weekly Team Focus  Bowel/Bladder             Swallow/Nutrition/ Hydration             ADL's   CGA/sup functional transfers and ADLs, Increased strength right shoulder/elbow musculature, FA/wrist, digit extensors slowly returning.  supervision  AE training for LB bathing/dressing, shower threshold/functional transfer training, NMR and strengthening RUE; Family ed today   Mobility   CGA bed mobility, CGA transfers, CGA/minA gait 172ft with RW, 4 stairs CGA/minA  supervision  L HB NMR, gait, dynamic standing balance, stairs.  Family ed   Communication             Safety/Cognition/ Behavioral Observations            Pain             Skin               Discharge Planning:  Wife coming in for family education can provide 24/7 supervision when discharged home.   Team Discussion: MD modifying BP medications, monitoring WBC; overall medically stable. Remains cont/incont. of B+B. Continue to note right LE weakness but making progress. Currently supervision for transfers and Gait CGA with a RW on uneven surfaces. Discussion of extension of LOS due to progress shown however appears wife can manage patient at goal level.  Patient on target to meet rehab goals: yes  *See Care Plan and progress notes for long and short-term goals.   Revisions to Treatment Plan:  Practice high level gait training  Teaching Needs: Transfers, toileting, medications, etc.    Current Barriers to Discharge: None  Possible Resolutions to Barriers: Recommend OP PT/OT follow up for continued progress Family education 07/05/20    Medical Summary Current Status: Right side hemiparesis secondary to left corona radiata infarct secondary small vessel disease  Barriers to Discharge: Medical stability;Decreased family/caregiver support;Incontinence   Possible Resolutions to [employer] Focus: Therapies, monitor muscle spasms, optimize BP/DM meds, follow labs - WBCs   Continued Need for Acute Rehabilitation Level of Care: The patient requires daily medical management by a physician with specialized training in physical medicine and rehabilitation for the following reasons:  Direction of a multidisciplinary physical rehabilitation program to maximize functional independence : Yes Medical management of patient stability for increased activity during participation in an intensive rehabilitation regime.: Yes Analysis of laboratory values and/or radiology reports with any subsequent need for medication adjustment and/or medical  intervention. : Yes   I attest that I was present, lead the team conference, and concur with the assessment and plan of the team.   Dorien Chihuahua B 07/05/2020, 12:40 PM

## 2020-07-06 ENCOUNTER — Inpatient Hospital Stay (HOSPITAL_COMMUNITY): Payer: PPO | Admitting: *Deleted

## 2020-07-06 ENCOUNTER — Inpatient Hospital Stay (HOSPITAL_COMMUNITY): Payer: PPO | Admitting: Physical Therapy

## 2020-07-06 ENCOUNTER — Inpatient Hospital Stay (HOSPITAL_COMMUNITY): Payer: PPO

## 2020-07-06 ENCOUNTER — Inpatient Hospital Stay (HOSPITAL_COMMUNITY): Payer: PPO | Admitting: Occupational Therapy

## 2020-07-06 LAB — GLUCOSE, CAPILLARY: Glucose-Capillary: 107 mg/dL — ABNORMAL HIGH (ref 70–99)

## 2020-07-06 NOTE — Progress Notes (Signed)
Physical Therapy Session Note  Patient Details  Name: Jacob Short MRN: [id number] Date of Birth: 01-24-1946  Today's Date: 07/06/2020 PT Individual Time: 0900-1015 PT Individual Time Calculation (min): 75 min   Short Term Goals: Week 2:  PT Short Term Goal 1 (Week 2): Pt will perform bed mobility with CGA PT Short Term Goal 2 (Week 2): Pt will perform bed<>chair transfer with CGA and LRAD PT Short Term Goal 3 (Week 2): Pt will ambulate at least 146ft with CGA and LRAD PT Short Term Goal 4 (Week 2): Pt will navigate up.down x4 steps with CGA and LRAD Week 3:  PT Short Term Goal 1 (Week 3): LTG = STG due to ELOS  Skilled Therapeutic Interventions/Progress Updates:    Pt received sitting in w/c, agreeable to PT session, he denies any pain. Focus of session to review floor transfers, bed mobility with bed height at 34inches, and furniture transfers to mimic home environment. WC transport from his room to ADL apartment room. Performed sit<>stand with CGA from w/c level, ambulated ~45ft on carpet with CGA and no AD, stand>sit with CGA to recliner. Instructed on techniques for standing from lower, soft surfaces. Able to perform transfer with minA from recliner height without AD. Similar situation needed for sofa couch. Instructed him on having wife present to assist at home for furniture transfers, pt understanding. WC transport then to ortho gym to perform floor transfers. Soft mat provided for comfort on floor. Stand<>pivot transfer with CGA from w/c to mat table. Sit>tall kneeling position with minA for stability on soft mat surface. From tall kneeling, positioned in quadruped where he performed several RUE NMR including modified bird dog with LUE extensions, focusing on RUE WBing, core activation, hip strengthening, and balance. He performed 2x10 reps of LUE extensions in quadruped. Then performed 2x10 reps of LUE extension with target reaching via cones that were placed strategically more towards  R side of body to promote further RUE WBing, good carryover. Pt required seated rest break on mat table b/w repetitions, requiring modA from tall kneeing to 1/2 kneeling and modA for 1/2 kneeling to mat table. Transition from tall kneeling to mat table limited by decreased hip ROM. Stand<>pivot from mat table to w/c with CGA. WC transport back to his room to practice bed transfers with bed height set to 34inches heigh to mimic home setup. Placed 4inch step stool at EOB and PT provided demonstration of safe technique via backward stepping onto stool with RW support, and then stool to sitting position on EOB. Pt performed x2 reps of this type of transfer, requiring CGA and RW for stability. Pt with good understanding and strong safety awareness during this, will continue to review and perform during his stay. Remainder of session focused on RUE strengthening. He performed the following RUE there-ex while seated in w/c: -2x10 AAROM wrist extension with towel support under wrist -2x10 AAROM elbow flex/ext in gravity eliminated positoin with towel to reduce friction -2x10 AAROM FA supination/pronation   WC transport back to his room, he remained seated in w/c at end of session with needs in reach.  Therapy Documentation Precautions:  Precautions Precautions: Fall Precaution Comments: R hemiparesis UE>LE Restrictions Weight Bearing Restrictions: No   Therapy/Group: Individual Therapy  Eria Lozoya P Kamorie Aldous PT 07/06/2020, 9:59 AM

## 2020-07-06 NOTE — Progress Notes (Signed)
[facility] PHYSICAL MEDICINE & REHABILITATION PROGRESS NOTE   Subjective/Complaints: Patient seen sitting up in a chair this morning.  He states he slept well overnight.  He has questions regarding discharge follow-up appointments.  ROS: Denies CP, SOB, N/V/D  Objective:   No results found. No results for input(s): WBC, HGB, HCT, PLT in the last 72 hours. No results for input(s): NA, K, CL, CO2, GLUCOSE, BUN, CREATININE, CALCIUM in the last 72 hours.  Intake/Output Summary (Last 24 hours) at 07/06/2020 1126 Last data filed at 07/06/2020 1021 Gross per 24 hour  Intake 1620 ml  Output 975 ml  Net 645 ml        Physical Exam: Vital Signs Blood pressure (!) 142/75, pulse 77, temperature 98.1 F (36.7 C), resp. rate 18, height 6\' 4"  (1.93 m), weight 81.6 kg, SpO2 98 %.  Constitutional: No distress . Vital signs reviewed. HENT: Normocephalic.  Atraumatic. Eyes: EOMI. No discharge. Cardiovascular: No JVD.  RRR. Respiratory: Normal effort.  No stridor.  Bilateral clear to auscultation. GI: Non-distended.  BS +. Skin: Warm and dry.  Intact. Psych: Normal mood.  Normal behavior. Musc: No edema in extremities.  No tenderness in extremities. Neuro: Alert Mild dysarthria, stable Good awareness of deficits.  Right facial droop, stable Motor:  RUE shoulder abduction 4/5, elbow flexion/extension 4/5, wrist extension 2/5, handgrip 1+/5, stable RLE: 4+-5/5 proximal to distal  Assessment/Plan: 1. Functional deficits secondary to left corona radiata infarct which require 3+ hours per day of interdisciplinary therapy in a comprehensive inpatient rehab setting.  Physiatrist is providing close team supervision and 24 hour management of active medical problems listed below.  Physiatrist and rehab team continue to assess barriers to discharge/monitor patient progress toward functional and medical goals  Care Tool:  Bathing    Body parts bathed by patient: Right arm, Chest, Abdomen,  Front perineal area, Buttocks, Right upper leg, Left upper leg, Face, Left arm, Left lower leg, Right lower leg   Body parts bathed by helper: Left arm Body parts n/a: Right lower leg, Left lower leg   Bathing assist Assist Level: Contact Guard/Touching assist     Upper Body Dressing/Undressing Upper body dressing   What is the patient wearing?: Pull over shirt    Upper body assist Assist Level: Set up assist    Lower Body Dressing/Undressing Lower body dressing      What is the patient wearing?: Pants, Incontinence brief     Lower body assist Assist for lower body dressing: Minimal Assistance - Patient > 75%     Toileting Toileting    Toileting assist Assist for toileting: Contact Guard/Touching assist Assistive Device Comment: bedpan   Transfers Chair/bed transfer  Transfers assist     Chair/bed transfer assist level: Supervision/Verbal cueing Chair/bed transfer assistive device: Armrests   Locomotion Ambulation   Ambulation assist      Assist level: Minimal Assistance - Patient > 75% Assistive device: Walker-rolling Max distance: 200   Walk 10 feet activity   Assist     Assist level: Minimal Assistance - Patient > 75% Assistive device: Walker-rolling   Walk 50 feet activity   Assist    Assist level: Minimal Assistance - Patient > 75% Assistive device: Walker-rolling    Walk 150 feet activity   Assist Walk 150 feet activity did not occur: Safety/medical concerns (fatigue after 70ft)  Assist level: Minimal Assistance - Patient > 75% Assistive device: Walker-rolling    Walk 10 feet on uneven surface  activity   Assist  Walk 10 feet on uneven surfaces activity did not occur: Safety/medical concerns         Wheelchair     Assist Will patient use wheelchair at discharge?: No Type of Wheelchair: Manual    Wheelchair assist level: Minimal Assistance - Patient > 75% Max wheelchair distance: 66' using L extremities.     Wheelchair 50 feet with 2 turns activity    Assist        Assist Level: Minimal Assistance - Patient > 75%   Wheelchair 150 feet activity     Assist          Medical Problem List and Plan: 1. Right side hemiparesis secondary to left corona radiata infarct secondary small vessel disease  Continue CIR  WHO don nightly 2.  Antithrombotics: -DVT/anticoagulation: Lovenox- d/c as ambulating >150 feet             -antiplatelet therapy: Current plan is for aspirin 81 mg daily and Brilinta 90 mg twice daily x30 days then transition back to aspirin and Plavix 3. Pain Management:   Robaxin PRN  Controlled on 10/14 4. Mood: Provide emotional support             -antipsychotic agents: N/A 5. Neuropsych: This patient is capable of making decisions on his own behalf. 6. Skin/Wound Care: Routine skin checks 7. Fluids/Electrolytes/Nutrition: Routine in and outs. 8.  CAD with angioplasty.  Continue aspirin. 9.  Diabetes mellitus with hyperglycemia and peripheral neuropathy.  Hemoglobin A1c 6.9.  Glucophage 1000 mg at breakfast 1500 mg with supper.    Glipizide 2.5 started on 10/1  Relatively controlled on 10/14  Monitor with increased mobility 10.  Hyperlipidemia.  Lipitor 11.  Tobacco abuse: Patient states that he has quit.  DCed nicotine patch. 12.  Hypertension.  Cozaar 25 mg daily, increased to 50 on 10/3, increased to 75 on 10/7, increased to 100 on 10/11  Relatively controlled on 10/14  Monitor with increased mobility. 13.  History of AAA without rupture.  Follow-up outpatient monitoring.  14.  Hyponatremia  Sodium 137 on 10/11  Continue to monitor 15.  Hypoalbuminemia  Supplement initiated on 9/28 16.  Leukocytosis:   WBCs 11.2 on 10/11, labs ordered for tomorrow  Afebrile, unchanged  Continue to monitor 17.  Mild acute blood loss anemia, stabilizing  Hemoglobin 12.9 on 10/11, labs ordered for tomorrow  Continue to monitor  LOS: 17 days A FACE TO FACE  EVALUATION WAS PERFORMED  Julius Matus Lorie Phenix 07/06/2020, 11:26 AM

## 2020-07-06 NOTE — Evaluation (Signed)
Recreational Therapy Assessment and Plan  Patient Details  Name: Jacob Short MRN: [id number] Date of Birth: 1946/05/22 Today's Date: 07/06/2020  Rehab Potential:  Good ELOS:   d/c 10//19   Hospital Problem: Principal Problem:   Small vessel disease, cerebrovascular   Past Medical History:      Past Medical History:  Diagnosis Date  . Coronary artery disease   . Diabetes mellitus without complication ([place])   . Peripheral vascular disease [facility])    Past Surgical History:       Past Surgical History:  Procedure Laterality Date  . CORONARY ANGIOPLASTY      Assessment & Plan Clinical Impression: Patient is a 74 year old right-handed male with history of CAD and angioplasty maintained on Plavix, diabetes mellitus, tobacco abuse, AAA without rupture and hyperlipidemia. Per chart review patient lives with spouse. Independent prior to admission. 1 level home 3 steps to entry. Presented 06/15/2020 with right side weakness. Cranial CT scan negative for acute changes. Chronic lacunar infarct of the right corona radiata. Patient did not receive TPA. CT angiogram of head and neck with no emergent findings. MRI showed acute lacunar infarct of the left corona radiata. Echocardiogram with ejection fraction of 50 to 95% grade 1 diastolic dysfunction. Admission chemistries unremarkable aside glucose 157, urine drug screen negative, urinalysis negative nitrite. Follow neurology services noted given failure of aspirin Plavix recommendations are for aspirin 81 mg daily and Brilinta 90 mg twice daily x30 days and transition back to aspirin and Plavix. Tolerating regular diet. Therapy evaluations completed and patient was admitted for a comprehensive rehab program.  Pt presents with decreased activity tolerance, decreased functional mobility, decreased balance, decreased coordination Limiting pt's independence with leisure/community pursuits.  Met with pt today to discuss leisure  and community interests, activity analysis/activity modifications, coping strategies.  Pt stated understanding of the above and is anxious to return home and continue therapies, is determined to regain independence.    Plan  Min 1 TR session for >20 minutes for community reintegration during LOS  Recommendations for other services: None   Discharge Criteria: Patient will be discharged from TR if patient refuses treatment 3 consecutive times without medical reason.  If treatment goals not met, if there is a change in medical status, if patient makes no progress towards goals or if patient is discharged from hospital.  The above assessment, treatment plan, treatment alternatives and goals were discussed and mutually agreed upon: by patient  [place] 07/06/2020, 11:40 AM

## 2020-07-06 NOTE — Progress Notes (Signed)
Physical Therapy Session Note  Patient Details  Name: Jacob Short MRN: [id number] Date of Birth: [birth year]  Today's Date: 07/06/2020 PT Individual Time: 2979-8921 PT Individual Time Calculation (min): 53 min   Short Term Goals: Week 3:  PT Short Term Goal 1 (Week 3): LTG = STG due to ELOS  Skilled Therapeutic Interventions/Progress Updates:    Pt received sitting in w/c and agreeable to therapy session. Transported to gym in w/c for time management and energy conservation. Gait training ~42ft using RW with CGA - demos R hip adduction during swing phase, with question cuing pt able to identify gait deviation and improve stance width though continues to have more narrow BOS also demos decreased gait speed with increased trunk flexion. Lateral side stepping at hallwy rail, no UE support, ~87ft down/back with min assist for balance - demos decreased L step length compared to R and delayed R step initiation when stepping towards the R. Sit<>stands during session with close supervision for safety - pt able to place R hand on/off RW without assist and sustain grasp during ambulation without use of orthosis. Donned maxisky harness for safety.  Performed the following dynamic gait training trials in the maxisky harness:  - forwards ambulation without AD focusing on increased B LE step length and increasing gait speed - agility ladder forward ambulation with 1 foot per square focusing on increased step length and step width - pt continues to demo difficulty performing longer step lengths - R/L lateral side stepping throughout agility ladder - continues to demo above impairments with significant difficulty taking large enough steps to place feet into the next square CGA provided throughout with no significant LOBs. Sit>supine on mat table with supervision and supine>sit with min assist for trunk upright. Assessed B LE hip PROM in supine with pt demoing significant ROM restrictions in all directions  (flexion, abduction, extension) with pt reporting deep pain at end ROM suggesting possible bony contribution to the limitations. Educated pt on importance of following up with his PCP regarding these findings for further assessment and imaging, if appropriate, as these limitations are contributing to his gait and balance impairments. Dynamic standing balance and R UE NMR task via grasping and placing cones from low<>high surfaces - therapist stabilizing cones and facilitating increased finger extension then pt able to grasp and pick up cone without assist. Gait training ~44ft back to room using RW with CGA - cuing for increased R foot clearance and hip abduction during swing - transitioned remainder ~61ft to room to ambulating without AD and min assist for balance - continued above cuing. Sit>supine on bed with supervision. Pt left supine in bed with needs in reach and bed alarm on.   Therapy Documentation Precautions:  Precautions Precautions: Fall Precaution Comments: R hemiparesis UE>LE Restrictions Weight Bearing Restrictions: No  Pain:   Reports deep hip pain at end ROM in all directions during passive hip ROM assessment - avoided the painful ROM.   Therapy/Group: Individual Therapy  Tawana Scale , PT, DPT, CSRS  07/06/2020, 3:09 PM

## 2020-07-06 NOTE — Progress Notes (Signed)
Occupational Therapy Session Note  Patient Details  Name: Jacob Short MRN: [id number] Date of Birth: May 19, 1946  Today's Date: 07/06/2020 OT Individual Time: 6728-9791 OT Individual Time Calculation (min): 57 min    Short Term Goals: Week 2:  OT Short Term Goal 1 (Week 2): Pt will donn/doff socks using AE as needed with min assist OT Short Term Goal 2 (Week 2): Pt will complete walk in shower with 2 inch threshold step over transfer with CGA OT Short Term Goal 3 (Week 2): Pt will achieve 15 degrees right active wrist extension. OT Short Term Goal 4 (Week 2): Pt will donn pants and brief with CGA.  Skilled Therapeutic Interventions/Progress Updates:    Pt sitting up in w/c, no c/o pain, agreeable to OT session.  Pt ambulated to bathroom using RW with CGA and completed TTB transfer with CGA.  Pt doffed clothing and bathed UB/LB using long handled sponge with supervision primarily in seated position.  Pt donned shirt, brief, and pants with setup using reacher and CGA during standing portion to pull over hips.  Pt ambulated to w/c at bedside with CGA using RW.  Attended NMES to right thumb palmar abduction with instruction on AAROM and manual positioning to isolate movement.  Call bell in reach, seat belt alarm on.  Therapy Documentation Precautions:  Precautions Precautions: Fall Precaution Comments: R hemiparesis UE>LE Restrictions Weight Bearing Restrictions: No   Therapy/Group: Individual Therapy  [name] 07/06/2020, 4:10 PM

## 2020-07-07 ENCOUNTER — Inpatient Hospital Stay (HOSPITAL_COMMUNITY): Payer: PPO | Admitting: Occupational Therapy

## 2020-07-07 ENCOUNTER — Inpatient Hospital Stay (HOSPITAL_COMMUNITY): Payer: PPO

## 2020-07-07 LAB — CBC WITH DIFFERENTIAL/PLATELET
Abs Immature Granulocytes: 0.04 10*3/uL (ref 0.00–0.07)
Basophils Absolute: 0 10*3/uL (ref 0.0–0.1)
Basophils Relative: 0 %
Eosinophils Absolute: 0.2 10*3/uL (ref 0.0–0.5)
Eosinophils Relative: 2 %
HCT: 40.5 % (ref 39.0–52.0)
Hemoglobin: 13 g/dL (ref 13.0–17.0)
Immature Granulocytes: 0 %
Lymphocytes Relative: 15 %
Lymphs Abs: 1.5 10*3/uL (ref 0.7–4.0)
MCH: 31.6 pg (ref 26.0–34.0)
MCHC: 32.1 g/dL (ref 30.0–36.0)
MCV: 98.3 fL (ref 80.0–100.0)
Monocytes Absolute: 0.9 10*3/uL (ref 0.1–1.0)
Monocytes Relative: 9 %
Neutro Abs: 7.6 10*3/uL (ref 1.7–7.7)
Neutrophils Relative %: 74 %
Platelets: 326 10*3/uL (ref 150–400)
RBC: 4.12 MIL/uL — ABNORMAL LOW (ref 4.22–5.81)
RDW: 13.6 % (ref 11.5–15.5)
WBC: 10.3 10*3/uL (ref 4.0–10.5)
nRBC: 0 % (ref 0.0–0.2)

## 2020-07-07 LAB — GLUCOSE, CAPILLARY: Glucose-Capillary: 105 mg/dL — ABNORMAL HIGH (ref 70–99)

## 2020-07-07 MED ORDER — HYDRALAZINE HCL 10 MG PO TABS
10.0000 mg | ORAL_TABLET | Freq: Three times a day (TID) | ORAL | Status: DC
  Administered 2020-07-07 – 2020-07-11 (×12): 10 mg via ORAL
  Filled 2020-07-07 (×12): qty 1

## 2020-07-07 NOTE — Progress Notes (Signed)
Occupational Therapy Weekly Progress Note  Patient Details  Name: Jacob Short MRN: [id number] Date of Birth: 1946-01-10  Beginning of progress report period: June 29, 2020 End of progress report period: July 07, 2020  Today's Date: 07/07/2020 OT Individual Time: 4825-0037 OT Individual Time Calculation (min): 60 min    Patient has met 4 of 4 short term goals.  Pt with excellent progress this week, meeting all of his short term goals.  Pt steadily gaining strength in RUE, however still very weak in right wrist and digital extension making it difficult to use hand functionally for fine motor tasks.  Pt exhibits good carryover of skills learned using AE for bathing and dressing including increased independence donning/doffing footwear from max assist to min assist.  Pt has improved technique during shower threshold transfer but still needs CGA and VCs to maintain balance and safety.  Pts wife participated in family education training and demonstrates good safety awareness and understanding of pts needs upon dc home.  Patient continues to demonstrate the following deficits: muscle weakness, decreased coordination and decreased motor planning and decreased standing balance and decreased balance strategies and therefore will continue to benefit from skilled OT intervention to enhance overall performance with BADL and IADL.  Patient progressing toward long term goals..  Continue plan of care.  OT Short Term Goals Week 2:  OT Short Term Goal 1 (Week 2): Pt will donn/doff socks using AE as needed with min assist OT Short Term Goal 1 - Progress (Week 2): Met OT Short Term Goal 2 (Week 2): Pt will complete walk in shower with 2 inch threshold step over transfer with CGA OT Short Term Goal 2 - Progress (Week 2): Met OT Short Term Goal 3 (Week 2): Pt will achieve 15 degrees right active wrist extension. OT Short Term Goal 3 - Progress (Week 2): Met OT Short Term Goal 4 (Week 2): Pt will donn  pants and brief with CGA. OT Short Term Goal 4 - Progress (Week 2): Met Week 3:  OT Short Term Goal 1 (Week 3): STGs=LTGs dt ELOS  Skilled Therapeutic Interventions/Progress Updates:    Pt sitting up in w/c, reports he is fine skipping bathing today and would like to focus therapy session on strengthening RUE.  Pt ambulated to large gym using RW with CGA.  Pt completed mat level strengthening in supine and sidelying including 2x15 reps of the following exercises:  Supine shoulder FF AROM Supine scapular protraction/retraction using 4lb dowel with hand secured using coban Supine triceps press using 4lb dowel with hand secured using coban Sidelying shoulder abduction AROM Seated ER/IR AROM Seated biceps curls AROM Seated FA pro/sup using 1lb free weight secured using coban Wrist extension/flexion at tabletop gravity eliminated AROM  Neuro re-ed completed to facilitate right thumb abduction using VCs/TCs/and visual cues to promote engagement of musculature.  Attended NMES performed to right thumb palmar abduction with manual positioning to isolate muscle and VCs to promote pt actively engage in task.    Pt ambulated to room and returned to w/c, call bell in reach, seat belt alarm applied.    Therapy Documentation Precautions:  Precautions Precautions: Fall Precaution Comments: R hemiparesis UE>LE Restrictions Weight Bearing Restrictions: No Pain: Pain Assessment Pain Scale: 0-10 Pain Score: 0-No pain ADL: ADL Equipment Provided: Long-handled sponge, Reacher, Long-handled shoe horn Eating: Set up Where Assessed-Eating: Wheelchair Grooming: Setup Where Assessed-Grooming: Sitting at sink Upper Body Bathing: Setup Where Assessed-Upper Body Bathing: Shower Lower Body Bathing: Contact guard Where Assessed-Lower  Body Bathing: Shower Upper Body Dressing: Setup Where Assessed-Upper Body Dressing: Chair Lower Body Dressing: Minimal assistance Where Assessed-Lower Body Dressing:  Chair Toileting: Contact guard Where Assessed-Toileting: Other (Comment) Toilet Transfer: Contact guard Toilet Transfer Method: Counselling psychologist: Radiographer, therapeutic: Not assessed Tub/Shower Transfer Method: Unable to assess Social research officer, government: Curator Method: Heritage manager: Radio broadcast assistant Vision Baseline Vision/History: No visual deficits;Wears glasses Wears Glasses: Reading only Patient Visual Report: No change from baseline Vision Assessment?: No apparent visual deficits Perception  Perception: Within Functional Limits Praxis Praxis: Intact   Therapy/Group: Individual Therapy  [name] 07/07/2020, 4:51 PM

## 2020-07-07 NOTE — Progress Notes (Signed)
[place] PHYSICAL MEDICINE & REHABILITATION PROGRESS NOTE   Subjective/Complaints: Patient seen sitting up in his chair, about to work with therapies this AM.  He states she slept well overnight.  He denies complaints.   ROS: Denies CP, SOB, N/V/D  Objective:   No results found. Recent Labs    07/07/20 0450  WBC 10.3  HGB 13.0  HCT 40.5  PLT 326   No results for input(s): NA, K, CL, CO2, GLUCOSE, BUN, CREATININE, CALCIUM in the last 72 hours.  Intake/Output Summary (Last 24 hours) at 07/07/2020 1005 Last data filed at 07/07/2020 0524 Gross per 24 hour  Intake 840 ml  Output 925 ml  Net -85 ml        Physical Exam: Vital Signs Blood pressure (!) 152/87, pulse 73, temperature 97.6 F (36.4 C), resp. rate 18, height 6\' 4"  (1.93 m), weight 81.6 kg, SpO2 97 %.  Constitutional: No distress . Vital signs reviewed. HENT: Normocephalic.  Atraumatic. Eyes: EOMI. No discharge. Cardiovascular: No JVD.  RRR. Respiratory: Normal effort.  No stridor.  Bilateral clear to auscultation. GI: Non-distended.  BS +. Skin: Warm and dry.  Intact. Psych: Normal mood.  Normal behavior. Musc: No edema in extremities.  No tenderness in extremities. Neuro: Alert Mild dysarthria, unchanged Good awareness of deficits.  Right facial droop, unchanged Motor:  RUE shoulder abduction 4/5, elbow flexion/extension 4/5, wrist extension 2/5, handgrip 1+/5, unchanged RLE: 4+-5/5 proximal to distal  Assessment/Plan: 1. Functional deficits secondary to left corona radiata infarct which require 3+ hours per day of interdisciplinary therapy in a comprehensive inpatient rehab setting.  Physiatrist is providing close team supervision and 24 hour management of active medical problems listed below.  Physiatrist and rehab team continue to assess barriers to discharge/monitor patient progress toward functional and medical goals  Care Tool:  Bathing    Body parts bathed by patient: Right arm, Chest,  Abdomen, Front perineal area, Buttocks, Right upper leg, Left upper leg, Face, Left arm, Left lower leg, Right lower leg   Body parts bathed by helper: Left arm Body parts n/a: Right lower leg, Left lower leg   Bathing assist Assist Level: Supervision/Verbal cueing     Upper Body Dressing/Undressing Upper body dressing   What is the patient wearing?: Pull over shirt    Upper body assist Assist Level: Set up assist    Lower Body Dressing/Undressing Lower body dressing      What is the patient wearing?: Pants, Incontinence brief     Lower body assist Assist for lower body dressing: Contact Guard/Touching assist     Toileting Toileting    Toileting assist Assist for toileting: Contact Guard/Touching assist Assistive Device Comment: bedpan   Transfers Chair/bed transfer  Transfers assist     Chair/bed transfer assist level: Supervision/Verbal cueing Chair/bed transfer assistive device: Programmer, multimedia   Ambulation assist      Assist level: Contact Guard/Touching assist Assistive device: Walker-rolling Max distance: 63ft   Walk 10 feet activity   Assist     Assist level: Contact Guard/Touching assist Assistive device: Walker-rolling   Walk 50 feet activity   Assist    Assist level: Contact Guard/Touching assist Assistive device: Walker-rolling    Walk 150 feet activity   Assist Walk 150 feet activity did not occur: Safety/medical concerns (fatigue after 46ft)  Assist level: Minimal Assistance - Patient > 75% Assistive device: Walker-rolling    Walk 10 feet on uneven surface  activity   Assist Walk 10 feet  on uneven surfaces activity did not occur: Safety/medical concerns         Wheelchair     Assist Will patient use wheelchair at discharge?: No Type of Wheelchair: Manual    Wheelchair assist level: Minimal Assistance - Patient > 75% Max wheelchair distance: 25' using L extremities.    Wheelchair 50 feet  with 2 turns activity    Assist        Assist Level: Minimal Assistance - Patient > 75%   Wheelchair 150 feet activity     Assist          Medical Problem List and Plan: 1. Right side hemiparesis secondary to left corona radiata infarct secondary small vessel disease  Continue CIR  WHO don nightly 2.  Antithrombotics: -DVT/anticoagulation: Lovenox- d/c as ambulating >150 feet             -antiplatelet therapy: Current plan is for aspirin 81 mg daily and Brilinta 90 mg twice daily x30 days then transition back to aspirin and Plavix 3. Pain Management:   Robaxin PRN  Controlled with meds on 10/15 4. Mood: Provide emotional support             -antipsychotic agents: N/A 5. Neuropsych: This patient is capable of making decisions on his own behalf. 6. Skin/Wound Care: Routine skin checks 7. Fluids/Electrolytes/Nutrition: Routine in and outs. 8.  CAD with angioplasty.  Continue aspirin. 9.  Diabetes mellitus with hyperglycemia and peripheral neuropathy.  Hemoglobin A1c 6.9.  Glucophage 1000 mg at breakfast 1500 mg with supper.    Glipizide 2.5 started on 10/1  Relatively controlled on 10/15  Monitor with increased mobility 10.  Hyperlipidemia.  Lipitor 11.  Tobacco abuse: Patient states that he has quit.  DCed nicotine patch. 12.  Hypertension.  Cozaar 25 mg daily, increased to 50 on 10/3, increased to 75 on 10/7, increased to 100 on 10/11  Hydralizine 10 TID started on 10/15  Monitor with increased mobility. 13.  History of AAA without rupture.  Follow-up outpatient monitoring.  14.  Hyponatremia  Sodium 137 on 10/11, labs ordered for Monday  Continue to monitor 15.  Hypoalbuminemia  Supplement initiated on 9/28 16.  Leukocytosis: Resolved  WBCs 10.3 on 10/15  Afebrile, unchanged  Continue to monitor 17.  Mild acute blood loss anemia: Resolved  Hemoglobin 13.0 on 10/15  Continue to monitor  LOS: 18 days A FACE TO FACE EVALUATION WAS PERFORMED  Ashwini Jago Lorie Phenix 07/07/2020, 10:05 AM

## 2020-07-07 NOTE — Progress Notes (Signed)
Physical Therapy Session Note  Patient Details  Name: Jacob Short MRN: [id number] Date of Birth: July 10, 1946  Today's Date: 07/07/2020 PT Individual Time: 0900-1000 + 6568-1275 PT Individual Time Calculation (min): 60 min + 73 min  Short Term Goals: Week 3:  PT Short Term Goal 1 (Week 3): LTG = STG due to ELOS  Skilled Therapeutic Interventions/Progress Updates:     1st session: Pt received sitting in w/c, agreeable to PT session; denies any pain and no complaints. He self propelled himself using BLE's only for hamstring activation/strengthening from his room to main hallway (~170ft). Performed gait training 222ft with minA and no AD in hallways with cues provided for increasing R heel strike and widening BOS. Performed additional gait training with RW and CGA, ambulating 255ft on level surfaces. Gait deficits improve with added AD. Pt then performed DGI, scoring 17/24 (see results below). Score indicative of increased falls risk. Pt then performed the following stand there-ex at // bars with BUE support: -2x10 RLE hamstring curls 2# ankle weight focusing on eccentric control -2x10 RLE hip flexion with 5 second isometric holds with 2# ankle weight -2x10 RLE hip flexion focusing on power with 2# ankle weight -2x10 single leg stance on RLE with BUE support to challenge both balance and RLE closed chain kinetics  Pt then ambulated inside // bars to perform higher level balance on inverted Bosu ball. Requiring minA for stepping on inverted Bosu ball and performed x10 minutes of standing balance with BUE support on // bars. Therapist providing perturbations to Bosu ball to challenge righting reactions, requiring minA guard for stability. He also performed x10 mini-squats on inverted bosu ball with BUE support on // bars and therapist providing minA guard. Ambulated with CGA and no AD back to his w/c and WC transport back to his room for time management. He remained seated in w/c at end of  session, needs in reach.  2nd session: Pt received supine in bed, agreeable to PT session; no c/o pain. Wife at bedside. Pt performed supine<>sit with CGA with use of bedrail. Donned his shoes with maxA for time management. Stand<>pivot transfer with CGA from EOB to w/c and then WC transport to dayroom gym. Performed dynamic gait training with minA and no AD on level surfaces, 2x61ft, stepping over objects and weaving in/out cones. After seated rest, he then performed dynamic standing balance with minA guard, stepping on 3 different colored cones with dual-task overlay. Increased difficulty with RLE coordination and R lateral stepping, possibly contributed to baseline hip ROM deficits. After seated rest break, then performed step-downs on 4inch platform with minA guard and no UE support. Focused on RLE eccentric lowering, RLE knee control, dynamic balance, and righting reactions. Pt then performed gait training over Lite gait with minimal to no body weight support (harness attached). He ambulated for 5 minutes at 1.1 mph, totaling 454ft and then additional 4 minutes at 0.8 mph at level 6 incline. With fatigue and with incline, demo's R intermittent toe drag and increased narrow BOS, visual cues for "straddling" white line on treadmill to widen BOS and for exaggerating R heel strike. Pt able to maintain standing for doffing/donning harness. WC transport for time management back to his room, stand<>pivot with CGA from w/c to EOB and sit>supine with supervision using bed rail. He remained supine in bed at end of session with needs in reach, wife at bedside, pt made comfortable.  Therapy Documentation Precautions:  Precautions Precautions: Fall Precaution Comments: R hemiparesis UE>LE Restrictions Weight  Bearing Restrictions: No  Balance Balance Assessed: Yes Standardized Balance Assessment Standardized Balance Assessment: Dynamic Gait Index Dynamic Gait Index Level Surface: Mild Impairment Change in  Gait Speed: Normal Gait with Horizontal Head Turns: Mild Impairment Gait with Vertical Head Turns: Mild Impairment Gait and Pivot Turn: Mild Impairment Step Over Obstacle: Mild Impairment Step Around Obstacles: Mild Impairment Steps: Mild Impairment Total Score: 17  Therapy/Group: Individual Therapy  Timika Muench P Sarabi Sockwell PT 07/07/2020, 10:57 AM

## 2020-07-07 NOTE — Progress Notes (Signed)
Occupational Therapy Discharge Summary  Patient Details  Name: Jacob Short MRN: [id number] Date of Birth: 04/02/1946     Patient has met 81 of 11 long term goals due to improved activity tolerance, improved balance, postural control, ability to compensate for deficits, functional use of  RIGHT upper and RIGHT lower extremity, improved awareness and improved coordination.  Patient to discharge at overall Supervision level.  Patient's care partner is independent to provide the necessary physical and cognitive assistance at discharge.    Reasons goals not met: na  Recommendation:  Patient will benefit from ongoing skilled OT services in outpatient setting to continue to advance functional skills in the area of BADL and iADL and strengthening/neuro re-ed of RUE.  Equipment: BSC  Reasons for discharge: treatment goals met and discharge from hospital  Patient/family agrees with progress made and goals achieved: Yes  OT Discharge Precautions/Restrictions  Precautions Precautions: Fall Precaution Comments: R hemiparesis UE>LE Restrictions Weight Bearing Restrictions: No Pain Pain Assessment Pain Scale: 0-10 Pain Score: 0-No pain ADL ADL Equipment Provided: Long-handled sponge, Reacher, Long-handled shoe horn Eating: Set up Where Assessed-Eating: Wheelchair Grooming: Setup Where Assessed-Grooming: Sitting at sink Upper Body Bathing: Setup Where Assessed-Upper Body Bathing: Shower Lower Body Bathing: Contact guard Where Assessed-Lower Body Bathing: Shower Upper Body Dressing: Setup Where Assessed-Upper Body Dressing: Chair Lower Body Dressing: Minimal assistance Where Assessed-Lower Body Dressing: Chair Toileting: Contact guard Where Assessed-Toileting: Other (Comment) Toilet Transfer: Therapist, music Method: Counselling psychologist: Radiographer, therapeutic: Not assessed Tub/Shower Transfer Method: Unable to assess Press photographer: Curator Method: Heritage manager: Radio broadcast assistant Vision Baseline Vision/History: No visual deficits;Wears glasses Wears Glasses: Reading only Patient Visual Report: No change from baseline Vision Assessment?: No apparent visual deficits Perception  Perception: Within Functional Limits Praxis Praxis: Intact Cognition Overall Cognitive Status: Within Functional Limits for tasks assessed Arousal/Alertness: Awake/alert Attention: Sustained;Focused Focused Attention: Appears intact Sustained Attention: Appears intact Memory: Appears intact Awareness: Appears intact Problem Solving: Appears intact Sequencing: Appears intact Decision Making: Appears intact Initiating: Appears intact Safety/Judgment: Appears intact Sensation Sensation Light Touch: Appears Intact Hot/Cold: Appears Intact Proprioception: Impaired by gross assessment Additional Comments: Impaired proprioception RUE Coordination Gross Motor Movements are Fluid and Coordinated: No Fine Motor Movements are Fluid and Coordinated: No Coordination and Movement Description: Impaired gross motor and fine motor coordination RUE however greatly improved from IE Finger Nose Finger Test: UTA RUE due to weak finger extensors; WNL LUE Motor  Motor Motor: Hemiplegia Motor - Skilled Clinical Observations: R HB weakness however improved from IE Mobility  Bed Mobility Rolling Right: Supervision/verbal cueing Rolling Left: Supervision/Verbal cueing Supine to Sit: Supervision/Verbal cueing Sit to Supine: Supervision/Verbal cueing Transfers Sit to Stand: Contact Guard/Touching assist Stand to Sit: Contact Guard/Touching assist  Trunk/Postural Assessment  Cervical Assessment Cervical Assessment: Exceptions to [facility] (forward head and shoulders) Thoracic Assessment Thoracic Assessment: Exceptions to [facility] Rodney Cruise) Lumbar Assessment Lumbar Assessment: Exceptions to  [facility] (posterior pelvic tilt) Postural Control Postural Control: Within Functional Limits  Balance Balance Balance Assessed: Yes Static Sitting Balance Static Sitting - Level of Assistance: 7: Independent Dynamic Sitting Balance Dynamic Sitting - Level of Assistance: 5: Stand by assistance Static Standing Balance Static Standing - Level of Assistance: 5: Stand by assistance Dynamic Standing Balance Dynamic Standing - Level of Assistance: 5: Stand by assistance Extremity/Trunk Assessment RUE Assessment RUE Assessment: Exceptions to [facility] Passive Range of Motion (PROM) Comments: WNL Active Range of Motion (AROM) Comments: Against  gravity: Shoulder abd: 90 degrees ; elbow flex/ext and FA WFL; wrist against gravity achieve to 0; flexion [facility] General Strength Comments: hand flex 3-/5, ext 1/5; wrist flex 3/5, ext 2-/5; forearm 3/5, bicep 3+/5, tricep 3+/5, shldr ff 3-/5; abd 2+/5, ER 2+/5, IR 3+/5, ext 3/5 LUE Assessment LUE Assessment: Within Functional Limits   [name] 07/07/2020, 5:09 PM

## 2020-07-08 ENCOUNTER — Inpatient Hospital Stay (HOSPITAL_COMMUNITY): Payer: PPO

## 2020-07-08 ENCOUNTER — Inpatient Hospital Stay (HOSPITAL_COMMUNITY): Payer: PPO | Admitting: Physical Therapy

## 2020-07-08 LAB — GLUCOSE, CAPILLARY: Glucose-Capillary: 101 mg/dL — ABNORMAL HIGH (ref 70–99)

## 2020-07-08 NOTE — Progress Notes (Signed)
[facility] PHYSICAL MEDICINE & REHABILITATION PROGRESS NOTE   Subjective/Complaints:  Breathing is good, ate breakfast , asking about hospital bed   ROS: Denies CP, SOB, N/V/D  Objective:   No results found. Recent Labs    07/07/20 0450  WBC 10.3  HGB 13.0  HCT 40.5  PLT 326   No results for input(s): NA, K, CL, CO2, GLUCOSE, BUN, CREATININE, CALCIUM in the last 72 hours.  Intake/Output Summary (Last 24 hours) at 07/08/2020 0704 Last data filed at 07/08/2020 0650 Gross per 24 hour  Intake 997 ml  Output 1075 ml  Net -78 ml        Physical Exam: Vital Signs Blood pressure (!) 147/86, pulse 83, temperature 98.3 F (36.8 C), temperature source Oral, resp. rate 16, height 6\' 4"  (1.93 m), weight 81.9 kg, SpO2 98 %.   General: No acute distress Mood and affect are appropriate Heart: Regular rate and rhythm no rubs murmurs or extra sounds Lungs: Clear to auscultation, breathing unlabored, no rales or wheezes Abdomen: Positive bowel sounds, soft nontender to palpation, nondistended Extremities: No clubbing, cyanosis, or edema Skin: No evidence of breakdown, no evidence of rash Good awareness of deficits.  Right facial droop, unchanged Motor:  RUE shoulder abduction 4/5, elbow flexion/extension 4/5, wrist extension 2/5, handgrip 1+/5, unchanged RLE: 4+-5/5 proximal to distal  Assessment/Plan: 1. Functional deficits secondary to left corona radiata infarct which require 3+ hours per day of interdisciplinary therapy in a comprehensive inpatient rehab setting.  Physiatrist is providing close team supervision and 24 hour management of active medical problems listed below.  Physiatrist and rehab team continue to assess barriers to discharge/monitor patient progress toward functional and medical goals  Care Tool:  Bathing    Body parts bathed by patient: Right arm, Chest, Abdomen, Front perineal area, Buttocks, Right upper leg, Left upper leg, Face, Left arm, Left lower  leg, Right lower leg   Body parts bathed by helper: Left arm Body parts n/a: Right lower leg, Left lower leg   Bathing assist Assist Level: Supervision/Verbal cueing     Upper Body Dressing/Undressing Upper body dressing   What is the patient wearing?: Pull over shirt    Upper body assist Assist Level: Set up assist    Lower Body Dressing/Undressing Lower body dressing      What is the patient wearing?: Pants, Incontinence brief     Lower body assist Assist for lower body dressing: Contact Guard/Touching assist     Toileting Toileting    Toileting assist Assist for toileting: Contact Guard/Touching assist Assistive Device Comment: bedpan   Transfers Chair/bed transfer  Transfers assist     Chair/bed transfer assist level: Supervision/Verbal cueing Chair/bed transfer assistive device: Programmer, multimedia   Ambulation assist      Assist level: Contact Guard/Touching assist Assistive device: Walker-rolling Max distance: 37ft   Walk 10 feet activity   Assist     Assist level: Contact Guard/Touching assist Assistive device: Walker-rolling   Walk 50 feet activity   Assist    Assist level: Contact Guard/Touching assist Assistive device: Walker-rolling    Walk 150 feet activity   Assist Walk 150 feet activity did not occur: Safety/medical concerns (fatigue after 78ft)  Assist level: Minimal Assistance - Patient > 75% Assistive device: Walker-rolling    Walk 10 feet on uneven surface  activity   Assist Walk 10 feet on uneven surfaces activity did not occur: Safety/medical concerns         Wheelchair  Assist Will patient use wheelchair at discharge?: No Type of Wheelchair: Manual    Wheelchair assist level: Minimal Assistance - Patient > 75% Max wheelchair distance: 48' using L extremities.    Wheelchair 50 feet with 2 turns activity    Assist        Assist Level: Minimal Assistance - Patient > 75%    Wheelchair 150 feet activity     Assist          Medical Problem List and Plan: 1. Right side hemiparesis secondary to left corona radiata infarct secondary small vessel disease  Continue CIR PT, OT  WHO don nightly 2.  Antithrombotics: -DVT/anticoagulation: Lovenox- d/c as ambulating >150 feet             -antiplatelet therapy: Current plan is for aspirin 81 mg daily and Brilinta 90 mg twice daily x30 days then transition back to aspirin and Plavix 3. Pain Management:   Robaxin PRN  Controlled with meds on 10/15 4. Mood: Provide emotional support             -antipsychotic agents: N/A 5. Neuropsych: This patient is capable of making decisions on his own behalf. 6. Skin/Wound Care: Routine skin checks 7. Fluids/Electrolytes/Nutrition: Routine in and outs. 8.  CAD with angioplasty.  Continue aspirin. 9.  Diabetes mellitus with hyperglycemia and peripheral neuropathy.  Hemoglobin A1c 6.9.  Glucophage 1000 mg at breakfast 1500 mg with supper.    Glipizide 2.5 started on 10/1  Relatively controlled on 10/15   CBG (last 3)  Recent Labs    07/06/20 0619 07/07/20 0609 07/08/20 0617  GLUCAP 107* 105* 101*    10.  Hyperlipidemia.  Lipitor 11.  Tobacco abuse: Patient states that he has quit.  DCed nicotine patch. 12.  Hypertension.  Cozaar 25 mg daily, increased to 50 on 10/3, increased to 75 on 10/7, increased to 100 on 10/11  Hydralizine 10 TID started on 10/15-monitor effect   Vitals:   07/07/20 1944 07/08/20 0616  BP: (!) 158/84 (!) 147/86  Pulse: 71 83  Resp: 16 16  Temp: 98.2 F (36.8 C) 98.3 F (36.8 C)  SpO2: 97% 98%   13.  History of AAA without rupture.  Follow-up outpatient monitoring.  14.  Hyponatremia  Sodium 137 on 10/11, labs ordered for Monday  Continue to monitor 15.  Hypoalbuminemia  Supplement initiated on 9/28 16.  Leukocytosis: Resolved  WBCs 10.3 on 10/15  Afebrile, unchanged  Continue to monitor 17.  Mild acute blood loss anemia:  Resolved  Hemoglobin 13.0 on 10/15  Continue to monitor  LOS: 19 days Hopkins E Diondra Pines 07/08/2020, 7:04 AM

## 2020-07-08 NOTE — Progress Notes (Signed)
Occupational Therapy Session Note  Patient Details  Name: Jacob Short MRN: [id number] Date of Birth: March 20, 1946  Today's Date: 07/08/2020 OT Individual Time: 3335-4562 OT Individual Time Calculation (min): 75 min    Short Term Goals: Week 2:  OT Short Term Goal 1 (Week 2): Pt will donn/doff socks using AE as needed with min assist OT Short Term Goal 1 - Progress (Week 2): Met OT Short Term Goal 2 (Week 2): Pt will complete walk in shower with 2 inch threshold step over transfer with CGA OT Short Term Goal 2 - Progress (Week 2): Met OT Short Term Goal 3 (Week 2): Pt will achieve 15 degrees right active wrist extension. OT Short Term Goal 3 - Progress (Week 2): Met OT Short Term Goal 4 (Week 2): Pt will donn pants and brief with CGA. OT Short Term Goal 4 - Progress (Week 2): Met Week 3:  OT Short Term Goal 1 (Week 3): STGs=LTGs dt ELOS  Skilled Therapeutic Interventions/Progress Updates:    OT session focused on R-NMR, functional transfers, bed mobility, and strengthening/endurance. Pt received sitting in w/c reporting he would like a hospital bed at home due to height of bed. MD also present and informed that he may not qualify for this DME due to functional status. OT left message with SW to follow-up next week. Transitioned to therapy gym, completing stand pivot transfer with SBA. Simulated bed mobility with elevated mat at 29 inches (bed is 34 in) and use of stool, requiring min assist. Completed 3x 10 reps of seated push-ups into mat for tricep strengthening required for positioning in bed/chair. OT facilitated R-NMR via transitioning R-side lying to sitting 2x 10 reps with min stabilization of R hand. Completed towel exercises 3x10 reps of retraction/protraction, adduction/abduction, and digit flexion extension. Also completed forearm pronation/supination with min-mod assist. Completed SciFit with R hand ace wrapped using increments of 1:30-2:00 forward and backwards directions 2x  each. At end of session, pt ambulated to room with SBA using RW and left sitting in w/c with all needs in reach.  Therapy Documentation Precautions:  Precautions Precautions: Fall Precaution Comments: R hemiparesis UE>LE Restrictions Weight Bearing Restrictions: No General:   Vital Signs: Therapy Vitals Temp: 98.3 F (36.8 C) Temp Source: Oral Pulse Rate: 83 Resp: 16 BP: (!) 147/86 Patient Position (if appropriate): Lying Oxygen Therapy SpO2: 98 % O2 Device: Room Air Pain: Pain Assessment Pain Scale: 0-10 Pain Score: 0-No pain ADL: ADL Equipment Provided: Long-handled sponge, Reacher, Long-handled shoe horn Eating: Set up Where Assessed-Eating: Wheelchair Grooming: Setup Where Assessed-Grooming: Sitting at sink Upper Body Bathing: Setup Where Assessed-Upper Body Bathing: Shower Lower Body Bathing: Contact guard Where Assessed-Lower Body Bathing: Shower Upper Body Dressing: Setup Where Assessed-Upper Body Dressing: Chair Lower Body Dressing: Minimal assistance Where Assessed-Lower Body Dressing: Chair Toileting: Contact guard Where Assessed-Toileting: Other (Comment) Toilet Transfer: Therapist, music Method: Counselling psychologist: Radiographer, therapeutic: Not assessed Tub/Shower Transfer Method: Unable to assess Social research officer, government: Curator Method: Heritage manager: Nurse, learning disability    Praxis   Exercises:   Other Treatments:     Therapy/Group: Individual Therapy  Duayne Cal 07/08/2020, 9:37 AM

## 2020-07-09 LAB — GLUCOSE, CAPILLARY
Glucose-Capillary: 123 mg/dL — ABNORMAL HIGH (ref 70–99)
Glucose-Capillary: 184 mg/dL — ABNORMAL HIGH (ref 70–99)

## 2020-07-09 NOTE — Discharge Summary (Addendum)
Physician Discharge Summary  Patient ID: Jacob Short MRN: [id number] DOB/AGE: 10-31-1945 74 y.o.  Admit date: 06/19/2020 Discharge date: 07/11/2020  Discharge Diagnoses:  Principal Problem:   Small vessel disease, cerebrovascular Active Problems:   Leukocytosis   Hypoalbuminemia due to protein-calorie malnutrition (HCC)   Hyponatremia   Essential hypertension   Diabetic peripheral neuropathy (HCC)   Right hemiparesis (HCC)   Muscle spasm   Labile blood pressure   Acute blood loss anemia   Controlled type 2 diabetes mellitus with hyperglycemia, without long-term current use of insulin (HCC)   Labile blood glucose Hyperlipidemia Tobacco abuse History of AAA without rupture  Discharged Condition: Stable  Significant Diagnostic Studies: CT Code Stroke CTA Head W/WO contrast  Result Date: 06/15/2020 CLINICAL DATA:  Right-sided weakness. EXAM: CT ANGIOGRAPHY HEAD AND NECK CT PERFUSION BRAIN TECHNIQUE: Multidetector CT imaging of the head and neck was performed using the standard protocol during bolus administration of intravenous contrast. Multiplanar CT image reconstructions and MIPs were obtained to evaluate the vascular anatomy. Carotid stenosis measurements (when applicable) are obtained utilizing NASCET criteria, using the distal internal carotid diameter as the denominator. Multiphase CT imaging of the brain was performed following IV bolus contrast injection. Subsequent parametric perfusion maps were calculated using RAPID software. CONTRAST:  Dose is currently unknown. COMPARISON:  Head CT from earlier the same day FINDINGS: CTA NECK FINDINGS Aortic arch: Atheromatous plaque.  Three vessel branching. Right carotid system: Mixed density mild for age plaque at the bifurcation without stenosis or ulceration. No ICA beading. Left carotid system: ICA tortuosity with loop. Mild plaque at the common carotid and bifurcation. No stenosis or ulceration. Vertebral arteries: No proximal  subclavian stenosis. There is bilateral subclavian atherosclerosis. Codominant vertebral arteries that are widely patent and smoothly contoured Skeleton: Facet osteoarthritis which is generalized with particularly bulky left-sided spurring at C4-5. Other neck: No evidence of mass or inflammation. Upper chest: Mild scar-like appearance in the upper lobes. Review of the MIP images confirms the above findings CTA HEAD FINDINGS Anterior circulation: Atheromatous plaque along the carotid siphons. No branch occlusion, beading, or aneurysm. No proximal flow limiting stenosis Posterior circulation: Vertebral and basilar arteries are smooth and widely patent. Elongation of the basilar which contacts the ventral brain. No branch occlusion, beading, or aneurysm. No proximal significant stenosis. Venous sinuses: Patent Anatomic variants: None significant Review of the MIP images confirms the above findings Prelim results were called by telephone at the time of interpretation on 06/15/2020 at 8:13 am to provider [place] . CT Brain Perfusion Findings: ASPECTS: 10 CBF (<30%) Volume: 44mL Perfusion (Tmax>6.0s) volume: 78mL. This area is 1 slice above the left petrous temporal bone and could be artifactual given history of pure motor weakness. As well, the area appears to extend to the medial temporal lobe, extending towards PCA distribution IMPRESSION: CTA: 1. No emergent finding. Negative for large vessel occlusion or flow reducing stenosis. 2. Moderate atherosclerosis in the head and neck. CT perfusion: No detected infarct. There is a 7 cc area highlighted in the inferior left temporal lobe, possibly artifactual given the provided history and location along the skull base. Electronically Signed   By: Monte Fantasia M.D.   On: 06/15/2020 08:22   CT Code Stroke CTA Neck W/WO contrast  Result Date: 06/15/2020 CLINICAL DATA:  Right-sided weakness. EXAM: CT ANGIOGRAPHY HEAD AND NECK CT PERFUSION BRAIN TECHNIQUE: Multidetector  CT imaging of the head and neck was performed using the standard protocol during bolus administration of intravenous contrast. Multiplanar  CT image reconstructions and MIPs were obtained to evaluate the vascular anatomy. Carotid stenosis measurements (when applicable) are obtained utilizing NASCET criteria, using the distal internal carotid diameter as the denominator. Multiphase CT imaging of the brain was performed following IV bolus contrast injection. Subsequent parametric perfusion maps were calculated using RAPID software. CONTRAST:  Dose is currently unknown. COMPARISON:  Head CT from earlier the same day FINDINGS: CTA NECK FINDINGS Aortic arch: Atheromatous plaque.  Three vessel branching. Right carotid system: Mixed density mild for age plaque at the bifurcation without stenosis or ulceration. No ICA beading. Left carotid system: ICA tortuosity with loop. Mild plaque at the common carotid and bifurcation. No stenosis or ulceration. Vertebral arteries: No proximal subclavian stenosis. There is bilateral subclavian atherosclerosis. Codominant vertebral arteries that are widely patent and smoothly contoured Skeleton: Facet osteoarthritis which is generalized with particularly bulky left-sided spurring at C4-5. Other neck: No evidence of mass or inflammation. Upper chest: Mild scar-like appearance in the upper lobes. Review of the MIP images confirms the above findings CTA HEAD FINDINGS Anterior circulation: Atheromatous plaque along the carotid siphons. No branch occlusion, beading, or aneurysm. No proximal flow limiting stenosis Posterior circulation: Vertebral and basilar arteries are smooth and widely patent. Elongation of the basilar which contacts the ventral brain. No branch occlusion, beading, or aneurysm. No proximal significant stenosis. Venous sinuses: Patent Anatomic variants: None significant Review of the MIP images confirms the above findings Prelim results were called by telephone at the time of  interpretation on 06/15/2020 at 8:13 am to provider [place] . CT Brain Perfusion Findings: ASPECTS: 10 CBF (<30%) Volume: 37mL Perfusion (Tmax>6.0s) volume: 63mL. This area is 1 slice above the left petrous temporal bone and could be artifactual given history of pure motor weakness. As well, the area appears to extend to the medial temporal lobe, extending towards PCA distribution IMPRESSION: CTA: 1. No emergent finding. Negative for large vessel occlusion or flow reducing stenosis. 2. Moderate atherosclerosis in the head and neck. CT perfusion: No detected infarct. There is a 7 cc area highlighted in the inferior left temporal lobe, possibly artifactual given the provided history and location along the skull base. Electronically Signed   By: Monte Fantasia M.D.   On: 06/15/2020 08:22   MR BRAIN WO CONTRAST  Result Date: 06/16/2020 CLINICAL DATA:  Right-sided weakness. EXAM: MRI HEAD WITHOUT CONTRAST TECHNIQUE: Multiplanar, multiecho pulse sequences of the brain and surrounding structures were obtained without intravenous contrast. COMPARISON:  MRI of the brain June 15, 2020 FINDINGS: Brain: No significant change of the focus of acute/subacute infarct in the left corona radiata. No new focus of restricted diffusion seen. Again noted susceptibility artifact in the left parasagittal posterior frontal lobe, suggesting hemosiderin deposit. Vascular: Normal flow voids. Skull and upper cervical spine: Negative Sinuses/Orbits: Negative Other: None. IMPRESSION: No significant change of the focus of acute/subacute infarct in the left corona radiata. No new focus of restricted diffusion identified. Electronically Signed   By: Pedro Earls M.D.   On: 06/16/2020 12:55   MR BRAIN WO CONTRAST  Result Date: 06/15/2020 CLINICAL DATA:  Neuro deficit, acute, with stroke suspected. Right-sided weakness EXAM: MRI HEAD WITHOUT CONTRAST TECHNIQUE: Multiplanar, multiecho pulse sequences of the brain and  surrounding structures were obtained without intravenous contrast. COMPARISON:  Head CT and CTA from earlier today FINDINGS: Brain: 10 mm acute infarct in the left corona radiata, adjacent to the lateral ventricle. Remote lacunar infarct at the right corona radiata. Ischemic gliosis is minor.  Small focus of remote hemorrhage along the parasagittal left frontal lobe.Generalized cortical volume loss. No hemorrhage, hydrocephalus, or masslike finding. Vascular: Normal flow voids. Skull and upper cervical spine: Normal marrow signal Sinuses/Orbits: Retention cysts along the floor of the right maxillary sinus. IMPRESSION: Acute lacunar infarct at the left corona radiata. Electronically Signed   By: Monte Fantasia M.D.   On: 06/15/2020 10:51   CT Code Stroke Cerebral Perfusion with contrast  Result Date: 06/15/2020 CLINICAL DATA:  Right-sided weakness. EXAM: CT ANGIOGRAPHY HEAD AND NECK CT PERFUSION BRAIN TECHNIQUE: Multidetector CT imaging of the head and neck was performed using the standard protocol during bolus administration of intravenous contrast. Multiplanar CT image reconstructions and MIPs were obtained to evaluate the vascular anatomy. Carotid stenosis measurements (when applicable) are obtained utilizing NASCET criteria, using the distal internal carotid diameter as the denominator. Multiphase CT imaging of the brain was performed following IV bolus contrast injection. Subsequent parametric perfusion maps were calculated using RAPID software. CONTRAST:  Dose is currently unknown. COMPARISON:  Head CT from earlier the same day FINDINGS: CTA NECK FINDINGS Aortic arch: Atheromatous plaque.  Three vessel branching. Right carotid system: Mixed density mild for age plaque at the bifurcation without stenosis or ulceration. No ICA beading. Left carotid system: ICA tortuosity with loop. Mild plaque at the common carotid and bifurcation. No stenosis or ulceration. Vertebral arteries: No proximal subclavian  stenosis. There is bilateral subclavian atherosclerosis. Codominant vertebral arteries that are widely patent and smoothly contoured Skeleton: Facet osteoarthritis which is generalized with particularly bulky left-sided spurring at C4-5. Other neck: No evidence of mass or inflammation. Upper chest: Mild scar-like appearance in the upper lobes. Review of the MIP images confirms the above findings CTA HEAD FINDINGS Anterior circulation: Atheromatous plaque along the carotid siphons. No branch occlusion, beading, or aneurysm. No proximal flow limiting stenosis Posterior circulation: Vertebral and basilar arteries are smooth and widely patent. Elongation of the basilar which contacts the ventral brain. No branch occlusion, beading, or aneurysm. No proximal significant stenosis. Venous sinuses: Patent Anatomic variants: None significant Review of the MIP images confirms the above findings Prelim results were called by telephone at the time of interpretation on 06/15/2020 at 8:13 am to provider [place] . CT Brain Perfusion Findings: ASPECTS: 10 CBF (<30%) Volume: 19mL Perfusion (Tmax>6.0s) volume: 32mL. This area is 1 slice above the left petrous temporal bone and could be artifactual given history of pure motor weakness. As well, the area appears to extend to the medial temporal lobe, extending towards PCA distribution IMPRESSION: CTA: 1. No emergent finding. Negative for large vessel occlusion or flow reducing stenosis. 2. Moderate atherosclerosis in the head and neck. CT perfusion: No detected infarct. There is a 7 cc area highlighted in the inferior left temporal lobe, possibly artifactual given the provided history and location along the skull base. Electronically Signed   By: Monte Fantasia M.D.   On: 06/15/2020 08:22   ECHOCARDIOGRAM COMPLETE  Result Date: 06/16/2020    ECHOCARDIOGRAM REPORT   Patient Name:   QADIR FOLKS Date of Exam: 06/16/2020 Medical Rec #:  [id number]       Height:       76.0 in  Accession #:    [id number]      Weight:       181.0 lb Date of Birth:  October 14, 1945        BSA:          2.123 m Patient Age:    48 years  BP:           183/103 mmHg Patient Gender: M               HR:           59 bpm. Exam Location:  Inpatient Procedure: 2D Echo, Cardiac Doppler and Color Doppler Indications:    Stroke 434.91 / I163.9  History:        Patient has no prior history of Echocardiogram examinations.                 CAD; Risk Factors:Diabetes.  Sonographer:    Bernadene Person RDCS Referring Phys: [id number] [place]  1. Left ventricular ejection fraction, by estimation, is 55 to 60%. The left ventricle has normal function. The left ventricle has no regional wall motion abnormalities. Left ventricular diastolic parameters are consistent with Grade I diastolic dysfunction (impaired relaxation).  2. Right ventricular systolic function is normal. The right ventricular size is normal. Tricuspid regurgitation signal is inadequate for assessing PA pressure.  3. The mitral valve is grossly normal. Trivial mitral valve regurgitation. No evidence of mitral stenosis.  4. The aortic valve is tricuspid. Aortic valve regurgitation is not visualized. No aortic stenosis is present.  5. The inferior vena cava is normal in size with greater than 50% respiratory variability, suggesting right atrial pressure of 3 mmHg. Conclusion(s)/Recommendation(s): No intracardiac source of embolism detected on this transthoracic study. A transesophageal echocardiogram is recommended to exclude cardiac source of embolism if clinically indicated. FINDINGS  Left Ventricle: Left ventricular ejection fraction, by estimation, is 55 to 60%. The left ventricle has normal function. The left ventricle has no regional wall motion abnormalities. The left ventricular internal cavity size was normal in size. There is  no left ventricular hypertrophy. Left ventricular diastolic parameters are consistent with Grade I diastolic  dysfunction (impaired relaxation). Normal left ventricular filling pressure. Right Ventricle: The right ventricular size is normal. No increase in right ventricular wall thickness. Right ventricular systolic function is normal. Tricuspid regurgitation signal is inadequate for assessing PA pressure. Left Atrium: Left atrial size was normal in size. Right Atrium: Right atrial size was normal in size. Prominent Eustachian valve. Pericardium: Trivial pericardial effusion is present. Mitral Valve: The mitral valve is grossly normal. Trivial mitral valve regurgitation. No evidence of mitral valve stenosis. Tricuspid Valve: The tricuspid valve is grossly normal. Tricuspid valve regurgitation is trivial. No evidence of tricuspid stenosis. Aortic Valve: The aortic valve is tricuspid. Aortic valve regurgitation is not visualized. No aortic stenosis is present. Pulmonic Valve: The pulmonic valve was grossly normal. Pulmonic valve regurgitation is not visualized. No evidence of pulmonic stenosis. Aorta: The aortic root and ascending aorta are structurally normal, with no evidence of dilitation. Venous: The inferior vena cava is normal in size with greater than 50% respiratory variability, suggesting right atrial pressure of 3 mmHg. IAS/Shunts: The atrial septum is grossly normal. EKG: Rhythm strip during this exam demostrated normal sinus rhythm and premature ventricular contractions.  LEFT VENTRICLE PLAX 2D LVIDd:         5.40 cm      Diastology LVIDs:         3.50 cm      LV e' medial:    4.30 cm/s LV PW:         1.30 cm      LV E/e' medial:  11.9 LV IVS:        1.00 cm      LV e' lateral:  7.54 cm/s LVOT diam:     2.40 cm      LV E/e' lateral: 6.8 LV SV:         92 LV SV Index:   43 LVOT Area:     4.52 cm  LV Volumes (MOD) LV vol d, MOD A2C: 118.0 ml LV vol d, MOD A4C: 118.0 ml LV vol s, MOD A2C: 65.3 ml LV vol s, MOD A4C: 66.1 ml LV SV MOD A2C:     52.7 ml LV SV MOD A4C:     118.0 ml LV SV MOD BP:      53.6 ml RIGHT  VENTRICLE RV S prime:     7.14 cm/s TAPSE (M-mode): 1.6 cm LEFT ATRIUM             Index       RIGHT ATRIUM           Index LA diam:        3.10 cm 1.46 cm/m  RA Area:     14.50 cm LA Vol (A2C):   80.5 ml 37.91 ml/m RA Volume:   33.10 ml  15.59 ml/m LA Vol (A4C):   43.1 ml 20.30 ml/m LA Biplane Vol: 61.2 ml 28.82 ml/m  AORTIC VALVE LVOT Vmax:   95.50 cm/s LVOT Vmean:  64.750 cm/s LVOT VTI:    0.202 m  AORTA Ao Root diam: 3.50 cm Ao Asc diam:  3.30 cm MITRAL VALVE MV Area (PHT): 1.91 cm    SHUNTS MV Decel Time: 398 msec    Systemic VTI:  0.20 m MV E velocity: 51.00 cm/s  Systemic Diam: 2.40 cm MV A velocity: 67.90 cm/s MV E/A ratio:  0.75 Eleonore Chiquito MD Electronically signed by Eleonore Chiquito MD Signature Date/Time: 06/16/2020/10:58:42 AM    Final    CT HEAD CODE STROKE WO CONTRAST  Result Date: 06/15/2020 CLINICAL DATA:  Code stroke.  Left-sided weakness EXAM: CT HEAD WITHOUT CONTRAST TECHNIQUE: Contiguous axial images were obtained from the base of the skull through the vertex without intravenous contrast. COMPARISON:  None. FINDINGS: Brain: No evidence of acute infarction, hemorrhage, hydrocephalus, extra-axial collection or mass lesion/mass effect. Chronic lacunar infarct at the right corona radiata. Cerebral volume loss in keeping with aging. Vascular: No hyperdense vessel or unexpected calcification. Skull: Normal. Negative for fracture or focal lesion. Sinuses/Orbits: No acute finding. Other: These results were communicated to Dr. Rory Percy at 8:04 amon 9/23/2021by text page via the [facility] messaging system. ASPECTS [facility] Stroke Program Early CT Score) - Ganglionic level infarction (caudate, lentiform nuclei, internal capsule, insula, M1-M3 cortex): 7 - Supraganglionic infarction (M4-M6 cortex): 3 Total score (0-10 with 10 being normal): 10 IMPRESSION: 1. No acute finding.ASPECTS is 10. 2. Chronic lacunar infarct at the right corona radiata. Electronically Signed   By: Monte Fantasia M.D.   On:  06/15/2020 08:05    Labs:  Basic Metabolic Panel: Recent Labs  Lab 07/10/20 0705  NA 137  K 3.9  CL 104  CO2 25  GLUCOSE 124*  BUN 12  CREATININE 0.70  CALCIUM 9.0    CBC: Recent Labs  Lab 07/07/20 0450  WBC 10.3  NEUTROABS 7.6  HGB 13.0  HCT 40.5  MCV 98.3  PLT 326    CBG: Recent Labs  Lab 07/07/20 0609 07/08/20 0617 07/09/20 0614 07/09/20 0813 07/10/20 0616  GLUCAP 105* 101* 123* 184* 44   Family history positive for hypertension as well as hyperlipidemia.  Denies any colon cancer esophageal cancer or rectal cancer  Brief HPI:   Jacob Short is a 74 y.o. right-handed male with history of CAD and angioplasty maintained on Plavix diabetes mellitus tobacco abuse AAA without rupture and hyperlipidemia.  Lives with spouse independent prior to admission.  1 level home 3 steps to entry.  Presented 06/15/2020 with right side weakness.  Cranial CT scan negative for acute changes.  Chronic lacunar infarct of the right corona radiata.  Patient did not receive TPA.  CT angiogram of head and neck no emergent findings.  MRI showed acute lacunar infarct of the left corona radiata.  Echocardiogram with ejection fraction of 50 to 24% grade 1 diastolic dysfunction.  Admission chemistries unremarkable aside from glucose 157 urine drug screen negative urinalysis negative nitrite.  Follow neurology services noted given failure of aspirin Plavix recommendations for aspirin 81 mg daily and Brilinta 90 mg twice daily x30 days transition back to aspirin and Plavix.  Tolerating a regular diet.  Patient was admitted for a comprehensive rehab program.   Hospital Course: Jacob Short was admitted to rehab 06/19/2020 for inpatient therapies to consist of PT, ST and OT at least three hours five days a week. Past admission physiatrist, therapy team and rehab RN have worked together to provide customized collaborative inpatient rehab.  Pertaining to patient's left corona radiata infarction  secondary small vessel disease currently maintained on aspirin 81 mg daily and Brilinta 90 mg daily x30 days then transition back to aspirin and Plavix.  Subcutaneous Lovenox for DVT prophylaxis discontinued patient ambulating greater than 150 feet.  CAD angioplasty continue on aspirin.  Blood sugars overall controlled hemoglobin A1c 6.9 patient maintained on Glucotrol as well as Glucophage a diabetic teaching.  Blood pressure control with hydralazine as well as Cozaar patient would follow-up primary MD.  Lipitor for hyperlipidemia.  He did have a history of tobacco abuse receiving counsel guard cessation of nicotine products.  Patient with history of AAA without rupture follow-up outpatient.   Blood pressures were monitored on TID basis and controlled  Diabetes has been monitored with ac/hs CBG checks and SSI was use prn for tighter BS control.    Rehab course: During patient's stay in rehab weekly team conferences were held to monitor patient's progress, set goals and discuss barriers to discharge. At admission, patient required moderate assist 15 feet 1 person hand-held assist moderate assist stand pivot transfers minimal assist sit to supine.  Minimal assist for eating moderate assist upper body bathing mod assist lower body bathing mod assist upper body dressing max is lower body dressing  Physical exam.  Blood pressure 127/89 pulse 76 temperature 98.8 respirations 18 oxygen saturations 99% room air HEENT Head.  Normocephalic and atraumatic Eyes.  Pupils round and reactive to light no discharge.nystagmus Neck.  Supple nontender no JVD without thyromegaly Cardiac regular rate rhythm without any extra sounds or murmur heard Abdomen.  Soft nontender positive bowel sounds without rebound Respiratory effort normal no respiratory distress without wheeze Extremities.  No clubbing cyanosis or edema Skin.  Clean and intact without breakdown Neurological.  Alert no acute distress mild dysarthria but  intelligible oriented x3 fair awareness of deficits.  Right upper extremity 1/5 SA, EF, 2/5 EE, 0/5 WT and hand.  Right lower extremity 4+/5 throughout left side strength intact   He/  has had improvement in activity tolerance, balance, postural control as well as ability to compensate for deficits. He/ has had improvement in functional use RUE/LUE  and RLE/LLE as well as improvement in awareness.  Perform  supine to sit contact-guard assist donned shoes with max assist for time management stand pivot transfers with contact-guard assist from edge of bed.  Ambulate 75 feet x 2 stepping over objects and weaving in and out of cones.  Perform step downs on 4 inch platform with minimal guard and no upper extremity support.  He was able to increase his ambulation up to 400 feet with rest breaks.  Patient exhibits good carryover of skills.  Improved technique during showering threshold transfers contact-guard assist.  Full family teaching completed plan discharge to home       Disposition: Discharged to home    Diet: Carb modified  Special Instructions: No driving smoking or alcohol  Plan is for aspirin 81 mg daily and Brilinta 90 mg twice daily through 07/18/2020 and Plavix 75 mg daily along with aspirin 81 mg daily  Medications at discharge 1.  Tylenol as needed 2.  Aspirin 81 mg p.o. daily 3.  Lipitor 80 mg p.o. daily 4.  Glucotrol 2.5 mg p.o. daily 5.  Hydralazine 10 mg p.o. every 8 hours 6.  Cozaar 100 mg p.o. daily 7.  Glucophage 1000 mg daily at breakfast 1500 mg with supper 8.  Robaxin 500 mg every 6 hours as needed muscle spasms 9.  Brilinta 90 mg p.o. twice daily  30-35 minutes were spent completing discharge summary and discharge planning  Discharge Instructions     Ambulatory referral to Neurology   Complete by: As directed    An appointment is requested in approximately 4 weeks left corona radiata infarction secondary to small vessel disease        Follow-up Information      Jamse Arn, MD Follow up.   Specialty: Physical Medicine and Rehabilitation Why: Office to call for appointment Contact information: [place] [id number] [phone]                 Signed: Cathlyn Parsons 07/11/2020, 5:28 AM Patient was seen, face-face, and physical exam performed by me on day of discharge, greater than 30 minutes of total time spent.. Please see progress note from day of discharge as well.  Delice Lesch, MD, ABPMR

## 2020-07-09 NOTE — Plan of Care (Signed)
  Problem: RH BOWEL ELIMINATION Goal: RH STG MANAGE BOWEL WITH ASSISTANCE Description: STG Manage Bowel with Min Assistance. Pt will perform pericare and hygiene with min assistance. Will have normal bowel pattern. Outcome: Progressing   Problem: RH BLADDER ELIMINATION Goal: RH STG MANAGE BLADDER WITH ASSISTANCE Description: STG Manage Bladder With  Min Assistance. Pt will be able to perform pericare and use equipment with min assist Outcome: Progressing   Problem: RH SKIN INTEGRITY Goal: RH STG SKIN FREE OF INFECTION/BREAKDOWN Description: Pt will be knowledgeable of techniques to maintain skin integrity with minimal cues.  Outcome: Progressing   Problem: RH SAFETY Goal: RH STG ADHERE TO SAFETY PRECAUTIONS W/ASSISTANCE/DEVICE Description: STG Adhere to Safety Precautions With  verbal cues  supervision /Devices per therapy Outcome: Progressing

## 2020-07-09 NOTE — Progress Notes (Signed)
No problems noted throughout the day. Pt able to verbalize needs, bowel movement noted today. Denies pain or discomfort. No prn meds given.

## 2020-07-10 ENCOUNTER — Inpatient Hospital Stay (HOSPITAL_COMMUNITY): Payer: PPO | Admitting: Occupational Therapy

## 2020-07-10 ENCOUNTER — Inpatient Hospital Stay (HOSPITAL_COMMUNITY): Payer: PPO

## 2020-07-10 LAB — GLUCOSE, CAPILLARY: Glucose-Capillary: 97 mg/dL (ref 70–99)

## 2020-07-10 LAB — BASIC METABOLIC PANEL
Anion gap: 8 (ref 5–15)
BUN: 12 mg/dL (ref 8–23)
CO2: 25 mmol/L (ref 22–32)
Calcium: 9 mg/dL (ref 8.9–10.3)
Chloride: 104 mmol/L (ref 98–111)
Creatinine, Ser: 0.7 mg/dL (ref 0.61–1.24)
GFR, Estimated: >60 mL/min (ref >60)
Glucose, Bld: 124 mg/dL — ABNORMAL HIGH (ref 70–99)
Potassium: 3.9 mmol/L (ref 3.5–5.1)
Sodium: 137 mmol/L (ref 135–145)

## 2020-07-10 MED ORDER — TICAGRELOR 90 MG PO TABS: 90.0000 mg | ORAL_TABLET | Freq: Two times a day (BID) | ORAL | 0 refills | Status: DC

## 2020-07-10 MED ORDER — METHOCARBAMOL 500 MG PO TABS: 500.0000 mg | ORAL_TABLET | Freq: Four times a day (QID) | ORAL | 0 refills | Status: DC | PRN

## 2020-07-10 MED ORDER — HYDRALAZINE HCL 10 MG PO TABS: 10.0000 mg | ORAL_TABLET | Freq: Three times a day (TID) | ORAL | 0 refills | Status: DC

## 2020-07-10 MED ORDER — CLOPIDOGREL BISULFATE 75 MG PO TABS: 75.0000 mg | ORAL_TABLET | Freq: Every day | ORAL | 0 refills | Status: DC

## 2020-07-10 MED ORDER — ATORVASTATIN CALCIUM 80 MG PO TABS: 80.0000 mg | ORAL_TABLET | Freq: Every day | ORAL | 0 refills | Status: DC

## 2020-07-10 MED ORDER — METFORMIN HCL 500 MG PO TABS: 1000.0000 mg | ORAL_TABLET | ORAL | 0 refills | Status: DC

## 2020-07-10 MED ORDER — GLIPIZIDE 5 MG PO TABS: 2.5000 mg | ORAL_TABLET | Freq: Every day | ORAL | 0 refills | Status: DC

## 2020-07-10 MED ORDER — LOSARTAN POTASSIUM 100 MG PO TABS: 100.0000 mg | ORAL_TABLET | Freq: Every day | ORAL | 0 refills | Status: DC

## 2020-07-10 MED ORDER — ACETAMINOPHEN 325 MG PO TABS: 650.0000 mg | ORAL_TABLET | Freq: Four times a day (QID) | ORAL | Status: DC | PRN

## 2020-07-10 NOTE — Progress Notes (Signed)
Inpatient Rehabilitation Care Coordinator  Discharge Note  The overall goal for the admission was met for:   Discharge location: Yes-HOME WITH WIFE WHO CAN PROVIDE ASSIST-WAS IN FOR EDUCATION  Length of Stay: Yes- 22 DAYS  Discharge activity level: Yes-SUPERVISION-CGA LEVEL  Home/community participation: Yes  Services provided included: MD, RD, PT, OT, RN, CM, Pharmacy, Neuropsych and SW  Financial Services: Private Insurance: [employer]  Follow-up services arranged: Outpatient: [facility]-OPPT & OT WILL CALL WIFE TO SET UP APPOINTMENTS, DME: [facility] & 3 IN 1 and Patient/Family has no preference for HH/DME agencies  Comments (or additional information): WIFE WAS [place] AND BOTH FEEL PREPARED TO [place]. OPPT AND OT SET UP VIA [facility]. PT HAS CONTACTED HIS PCP AND SCHEDULING FOLLOW UP APPOINTMENT. Patient/Family verbalized understanding of follow-up arrangements: Yes  Individual responsible for coordination of the follow-up plan: SELF & KIMBERLY-WIFE [phone]  Confirmed correct DME delivered: Elease Hashimoto 07/10/2020    Elease Hashimoto

## 2020-07-10 NOTE — Progress Notes (Signed)
Occupational Therapy Session Note  Patient Details  Name: BREK REECE MRN: [id number] Date of Birth: June 17, 1946  Today's Date: 07/10/2020 OT Individual Time: 0800-0900 OT Individual Time Calculation (min): 60 min     Skilled Therapeutic Interventions/Progress Updates:    Patient in bed, alert and ready for therapy session.  He denies pain and requests a shower this am.  Supine to sitting edge of bed mod I.  Sit to stand and ambulation with RW to/from bed, shower bench and w/c with CGA.  Shower completed seated on shower bench with set up/CS.  Dressing completed seated on arm chair - set up for [facility] shirt, set up for pants, CGA for CM in stance, mod A socks.  Grooming tasks completed w.c level at sink with mod I.  Completed SPT to/from mat table in therapy gym CGA without AD - right UE NMRE exercises completed seated and in stance with min A, cues for scapular positioning, proximal control, inhibition of distal flexors.  He remained seated in w/c at close of session, callbell and tray table in reach.    Therapy Documentation Precautions:  Precautions Precautions: Fall Precaution Comments: R hemiparesis UE>LE Restrictions Weight Bearing Restrictions: No  Therapy/Group: Individual Therapy  Carlos Levering 07/10/2020, 7:33 AM

## 2020-07-10 NOTE — Progress Notes (Signed)
Physical Therapy Session Note  Patient Details  Name: Jacob Short MRN: [id number] Date of Birth: 04-27-1946  Today's Date: 07/10/2020 PT Individual Time: 1000-1100 PT Individual Time Calculation (min): 60 min   Short Term Goals: Week 3:  PT Short Term Goal 1 (Week 3): LTG = STG due to ELOS  Skilled Therapeutic Interventions/Progress Updates:    Patient received sitting up in wc agreeable to PT. He denies pain, but endorses R hip "stiffness." PT propelling patient in wc to therapy gym for time management and energy conservation. He was able to transfer to therapy mat via stand pivot and supervision. Patient completing the berg balance scale: 36/56. He had greatest difficulty completing SLS tasks including toe taps onto 6" box. He had LOB to there R requiring intervention from PT to regain balance. Discussed findings of test with patient, reminding him of his risk for falling once home and how to mitigate risk. Patient performing standing toe taps onto 6" box 3x40 with CGA and verbal/tactile cues to engage standing leg glutes/hip stabilizers with good success and minimizing LOB. PT provided patient with HEP for hip/glute strengthening to be performed at home. Exercises as follows: sidelying clamshells 2x12 B, sidelying hip abduction 2 x12 B, hooklying marches 2x12, hooklying bridges 2x12, sidelying hip adduction 2x12 B, standing hip abduction with B UE support 2x12 B, lateral stepping with B UE support x93ft. Patient returning to room in wc, seatbelt alarm on, call light within reach.   Therapy Documentation Precautions:  Precautions Precautions: Fall Precaution Comments: R hemiparesis UE>LE Restrictions Weight Bearing Restrictions: No    Therapy/Group: Individual Therapy  Karoline Caldwell, PT, DPT, CBIS 07/10/2020, 7:44 AM

## 2020-07-10 NOTE — Progress Notes (Signed)
[place] PHYSICAL MEDICINE & REHABILITATION PROGRESS NOTE   Subjective/Complaints: Patient seen sitting up in his chair this morning working with therapies.  He states he slept well overnight.  He is looking forward to discharge tomorrow.  He states he is working on try to get a hospital bed.    ROS: Denies CP, SOB, N/V/D  Objective:   No results found. No results for input(s): WBC, HGB, HCT, PLT in the last 72 hours. Recent Labs    07/10/20 0705  NA 137  K 3.9  CL 104  CO2 25  GLUCOSE 124*  BUN 12  CREATININE 0.70  CALCIUM 9.0    Intake/Output Summary (Last 24 hours) at 07/10/2020 1402 Last data filed at 07/10/2020 0600 Gross per 24 hour  Intake 240 ml  Output 750 ml  Net -510 ml        Physical Exam: Vital Signs Blood pressure (!) 137/92, pulse 80, temperature 97.7 F (36.5 C), resp. rate 17, height 6\' 4"  (1.93 m), weight 78.3 kg, SpO2 98 %.  Constitutional: No distress . Vital signs reviewed. HENT: Normocephalic.  Atraumatic. Eyes: EOMI. No discharge. Cardiovascular: No JVD.  RRR. Respiratory: Normal effort.  No stridor.  Bilateral clear to auscultation. GI: Non-distended.  BS +. Skin: Warm and dry.  Intact. Psych: Normal mood.  Normal behavior. Musc: No edema in extremities.  No tenderness in extremities. Neuro: Alert Right facial droop, unchanged Motor:  RUE shoulder abduction 4/5, elbow flexion/extension 4/5, wrist extension 2+/5, handgrip 1+/5 RLE: 4+-5/5 proximal to distal  Assessment/Plan: 1. Functional deficits secondary to left corona radiata infarct which require 3+ hours per day of interdisciplinary therapy in a comprehensive inpatient rehab setting.  Physiatrist is providing close team supervision and 24 hour management of active medical problems listed below.  Physiatrist and rehab team continue to assess barriers to discharge/monitor patient progress toward functional and medical goals  Care Tool:  Bathing    Body parts bathed by  patient: Right arm, Chest, Abdomen, Front perineal area, Buttocks, Right upper leg, Left upper leg, Face, Left arm, Left lower leg, Right lower leg   Body parts bathed by helper: Left arm Body parts n/a: Right lower leg, Left lower leg   Bathing assist Assist Level: Supervision/Verbal cueing     Upper Body Dressing/Undressing Upper body dressing   What is the patient wearing?: Pull over shirt    Upper body assist Assist Level: Set up assist    Lower Body Dressing/Undressing Lower body dressing      What is the patient wearing?: Pants, Incontinence brief     Lower body assist Assist for lower body dressing: Contact Guard/Touching assist     Toileting Toileting    Toileting assist Assist for toileting: Contact Guard/Touching assist Assistive Device Comment: bedpan   Transfers Chair/bed transfer  Transfers assist     Chair/bed transfer assist level: Supervision/Verbal cueing Chair/bed transfer assistive device: Programmer, multimedia   Ambulation assist      Assist level: Contact Guard/Touching assist Assistive device: No Device Max distance: 120   Walk 10 feet activity   Assist     Assist level: Contact Guard/Touching assist Assistive device: No Device   Walk 50 feet activity   Assist    Assist level: Contact Guard/Touching assist Assistive device: No Device    Walk 150 feet activity   Assist Walk 150 feet activity did not occur: Safety/medical concerns (fatigue after 22ft)  Assist level: Minimal Assistance - Patient > 75% Assistive device:  Walker-rolling    Walk 10 feet on uneven surface  activity   Assist Walk 10 feet on uneven surfaces activity did not occur: Safety/medical concerns         Wheelchair     Assist Will patient use wheelchair at discharge?: No Type of Wheelchair: Manual    Wheelchair assist level: Minimal Assistance - Patient > 75% Max wheelchair distance: 52' using L extremities.    Wheelchair  50 feet with 2 turns activity    Assist        Assist Level: Minimal Assistance - Patient > 75%   Wheelchair 150 feet activity     Assist          Medical Problem List and Plan: 1. Right side hemiparesis secondary to left corona radiata infarct secondary small vessel disease  Continue CIR  WHO don nightly 2.  Antithrombotics: -DVT/anticoagulation: Lovenox- d/c as ambulating >150 feet             -antiplatelet therapy: Current plan is for aspirin 81 mg daily and Brilinta 90 mg twice daily x30 days then transition back to aspirin and Plavix 3. Pain Management:   Robaxin PRN  Controlled with meds on 10/18 4. Mood: Provide emotional support             -antipsychotic agents: N/A 5. Neuropsych: This patient is capable of making decisions on his own behalf. 6. Skin/Wound Care: Routine skin checks 7. Fluids/Electrolytes/Nutrition: Routine in and outs. 8.  CAD with angioplasty.  Continue aspirin. 9.  Diabetes mellitus with hyperglycemia and peripheral neuropathy.  Hemoglobin A1c 6.9.  Glucophage 1000 mg at breakfast 1500 mg with supper.    Glipizide 2.5 started on 10/1  Slightly labile on 10/18   CBG (last 3)  Recent Labs    07/09/20 0614 07/09/20 0813 07/10/20 0616  GLUCAP 123* 184* 97    10.  Hyperlipidemia.  Lipitor 11.  Tobacco abuse: Patient states that he has quit.  DCed nicotine patch. 12.  Hypertension.  Cozaar 25 mg daily, increased to 50 on 10/3, increased to 75 on 10/7, increased to 100 on 10/11  Hydralizine 10 TID started on 10/15-monitor effect   Vitals:   07/10/20 0352 07/10/20 1301  BP: 119/78 (!) 137/92  Pulse: 72 80  Resp: 18 17  Temp: 98.1 F (36.7 C) 97.7 F (36.5 C)  SpO2: 98% 98%   Relatively controlled on 10/18 13.  History of AAA without rupture.  Follow-up outpatient monitoring.  14.  Hyponatremia  Sodium 137 on 10/18  Continue to monitor 15.  Hypoalbuminemia  Supplement initiated on 9/28 16.  Leukocytosis: Resolved  WBCs 10.3  on 10/15  Afebrile, unchanged  Continue to monitor 17.  Mild acute blood loss anemia: Resolved  Hemoglobin 13.0 on 10/15  Continue to monitor  LOS: 21 days A FACE TO FACE EVALUATION WAS PERFORMED  Carmita Boom Lorie Phenix 07/10/2020, 2:02 PM

## 2020-07-10 NOTE — Progress Notes (Signed)
Patient ID: Jacob Short, male   DOB: 1945-12-06, 74 y.o.   MRN: [id number] Met with pt who feels he is ready for discharge tomorrow. His equipment was delivered and his OP is set up. Wife has been in for education. See in am for any last minute questions.

## 2020-07-10 NOTE — Progress Notes (Signed)
Physical Therapy Session Note  Patient Details  Name: Jacob Short MRN: [id number] Date of Birth: 10-23-1945  Today's Date: 07/10/2020 PT Individual Time: 1130-1200 PT Individual Time Calculation (min): 30 min   Short Term Goals: Week 1:  PT Short Term Goal 1 (Week 1): Pt will perform bed mobility with minA PT Short Term Goal 2 (Week 1): Pt will perform bed<>chair transfer with minA and LRAD PT Short Term Goal 3 (Week 1): Pt will ambulate at least 29ft with minA and LRAD PT Short Term Goal 4 (Week 1): Pt will navigate up/down at least 4 steps with minA and LRAD Week 2:  PT Short Term Goal 1 (Week 2): Pt will perform bed mobility with CGA PT Short Term Goal 2 (Week 2): Pt will perform bed<>chair transfer with CGA and LRAD PT Short Term Goal 3 (Week 2): Pt will ambulate at least 157ft with CGA and LRAD PT Short Term Goal 4 (Week 2): Pt will navigate up.down x4 steps with CGA and LRAD Week 3:  PT Short Term Goal 1 (Week 3): LTG = STG due to ELOS  Skilled Therapeutic Interventions/Progress Updates:    PAIN Denies pain  Pt initially oob in wc.  STS and gait x 158ft x 2 no AD and cues for increased clearance RLE/to widen base, for posture, cga.  Standing balance: Standing reverse partial lunges w/single UE support x 10 each, repeated x 2 using LUE/RUE w/deceased balance w/RUE retrogait using rail to L w/light touch for balance x 49ft w/cga Standing marching in place w/single UE support, more challenging w/RUE only Standing heel raises - 50% ROM, maintains balance w/supervision Standing tandem w/single UE support up to 10 count bilat w/cga  Pt provided w/2 additional exercised for HEP w/focus on balance including forward/retrogait and tandem stance using counter for support for each exercise.  Pt propels wc 120ft w/bilat LEs for R HS strengthening/bipedal acticity. SPT to bed w/cga. Sit tos supine mod I Pt left supine w/rails up x 3, alarm set, bed in lowest position, and needs in  reach.  Therapy Documentation Precautions:  Precautions Precautions: Fall Precaution Comments: R hemiparesis UE>LE Restrictions Weight Bearing Restrictions: No    Therapy/Group: Individual Therapy  Callie Fielding, PT   Jerrilyn Cairo 07/10/2020, 12:25 PM

## 2020-07-10 NOTE — Discharge Summary (Signed)
Physical Therapy Discharge Summary  Patient Details  Name: Jacob Short MRN: [id number] Date of Birth: 08-27-1946  Today's Date: 07/10/2020 PT Individual Time: 1330-1430 PT Individual Time Calculation (min): 60 min    Patient has met 9 of 9 long term goals due to improved activity tolerance, improved balance, improved postural control, increased strength, ability to compensate for deficits, functional use of  right upper extremity and right lower extremity and improved coordination.  Patient to discharge at an ambulatory level Supervision.   Patient's care partner is independent to provide the necessary physical assistance at discharge.  Reasons goals not met: n/a  Recommendation:  Patient will benefit from ongoing skilled PT services in outpatient setting to continue to advance safe functional mobility, address ongoing impairments in RHB weakness, decreased dynamic standing balance, gait deficits in order to minimize fall risk.  Equipment: RW  Reasons for discharge: treatment goals met and discharge from hospital  Patient/family agrees with progress made and goals achieved: Yes  PT Discharge Precautions/Restrictions Precautions Precautions: Fall Precaution Comments: R hemiparesis UE>LE Restrictions Weight Bearing Restrictions: No Vision/Perception  Vision - Assessment Ocular Range of Motion: Within Functional Limits Alignment/Gaze Preference: Within Defined Limits Perception Perception: Within Functional Limits Comments: Improved awareness of RUE during functional mobility tasks Praxis Praxis: Intact  Cognition Overall Cognitive Status: Within Functional Limits for tasks assessed Arousal/Alertness: Awake/alert Orientation Level: Oriented X4 Attention: Sustained;Focused Focused Attention: Appears intact Sustained Attention: Appears intact Problem Solving: Appears intact Safety/Judgment: Appears intact Sensation Sensation Light Touch: Appears  Intact Coordination Gross Motor Movements are Fluid and Coordinated: No Fine Motor Movements are Fluid and Coordinated: No Coordination and Movement Description: Greatly improved since initial evalutation however impaired gross motor and fine motor coordination with RUE Finger Nose Finger Test: UTA RUE due to weak finger extensors; WNL LUE Motor  Motor Motor: Hemiplegia Motor - Skilled Clinical Observations: R HB weakness however improved from IE  Mobility Bed Mobility Bed Mobility: Rolling Right;Rolling Left;Supine to Sit;Sit to Supine Rolling Right: Supervision/verbal cueing Rolling Left: Supervision/Verbal cueing Supine to Sit: Supervision/Verbal cueing Sit to Supine: Supervision/Verbal cueing Transfers Transfers: Sit to Stand;Stand to Sit;Stand Pivot Transfers Sit to Stand: Supervision/Verbal cueing Stand to Sit: Supervision/Verbal cueing Stand Pivot Transfers: Supervision/Verbal cueing Stand Pivot Transfer Details: Verbal cues for precautions/safety Transfer (Assistive device): Rolling walker Locomotion  Gait Ambulation: Yes Gait Assistance: Supervision/Verbal cueing Gait Distance (Feet): 150 Feet Assistive device: Rolling walker Gait Assistance Details: Verbal cues for precautions/safety;Verbal cues for safe use of DME/AE;Verbal cues for sequencing;Verbal cues for gait pattern Gait Gait: Yes Gait Pattern: Impaired Gait Pattern: Within Functional Limits;Decreased step length - right;Decreased step length - left;Narrow base of support;Decreased weight shift to right;Decreased hip/knee flexion - left;Decreased hip/knee flexion - right;Poor foot clearance - right Gait velocity: decreased Stairs / Additional Locomotion Stairs: Yes Stairs Assistance: Contact Guard/Touching assist Stair Management Technique: Two rails Number of Stairs: 12 Height of Stairs: 6 Ramp: Supervision/Verbal cueing  Trunk/Postural Assessment  Cervical Assessment Cervical Assessment: Exceptions to  [facility] (forward head posture, rounded shldrs) Thoracic Assessment Thoracic Assessment: Exceptions to [facility] (kyphosis) Lumbar Assessment Lumbar Assessment: Exceptions to [facility] (post pelvic tilt) Postural Control Postural Control: Within Functional Limits  Balance Balance Balance Assessed: Yes Standardized Balance Assessment Standardized Balance Assessment: Berg Balance Test Berg Balance Test Sit to Stand: Able to stand without using hands and stabilize independently Standing Unsupported: Able to stand 2 minutes with supervision Sitting with Back Unsupported but Feet Supported on Floor or Stool: Able to sit safely and securely 2  minutes Stand to Sit: Sits safely with minimal use of hands Transfers: Able to transfer safely, definite need of hands Standing Unsupported with Eyes Closed: Able to stand 10 seconds with supervision Standing Ubsupported with Feet Together: Able to place feet together independently and stand for 1 minute with supervision From Standing, Reach Forward with Outstretched Arm: Can reach forward >12 cm safely (5") From Standing Position, Pick up Object from Floor: Unable to try/needs assist to keep balance From Standing Position, Turn to Look Behind Over each Shoulder: Looks behind from both sides and weight shifts well Turn 360 Degrees: Able to turn 360 degrees safely but slowly Standing Unsupported, Alternately Place Feet on Step/Stool: Needs assistance to keep from falling or unable to try Standing Unsupported, One Foot in Front: Able to plae foot ahead of the other independently and hold 30 seconds Standing on One Leg: Unable to try or needs assist to prevent fall Total Score: 36 Extremity Assessment  RUE Assessment RUE Assessment: Exceptions to [facility] Passive Range of Motion (PROM) Comments: WNL Active Range of Motion (AROM) Comments: Against gravity: Shoulder abd: 90 degrees ; elbow flex/ext and FA WFL; wrist against gravity achieve to 0; flexion [facility] General Strength  Comments: hand flex 3-/5, ext 1/5; wrist flex 3/5, ext 2-/5; forearm 3/5, bicep 3+/5, tricep 3+/5, shldr ff 3-/5; abd 2+/5, ER 2+/5, IR 3+/5, ext 3/5 LUE Assessment LUE Assessment: Within Functional Limits General Strength Comments: Grossly 5/5 RLE Assessment RLE Assessment: Exceptions to [facility] General Strength Comments: Ankle DF 4+/5, knee ext 4+/5, hip flex 4/5 LLE Assessment LLE Assessment: Within Functional Limits General Strength Comments: Grossly 5/5  Skilled Intervention: Pt received supine in bed, agreeable to PT session without c/o pain. Supine<>sit with supervision with use of bed features. Stand<>pivot with supervision and no AD from EOB to w/c. WC transport for time management during session. Performed car transfer with CGA and RW and then negotiated up/down ~69f ramp with supervision and RW. WC transport to ADL apartment room. He ambulated on carpet with RW and supervision, stand>sit with RW to regular flat bed with supervision. Sit>supine with supervision without bed features, able to roll L<>R with supervision as well. Supine<>sit with supervision on regular flat bed. Ambulated ~150-1719fwith supervision and RW on level surfaces. After seated rest break, performed additional gait training with suitcase carry via 7lb dumbbell, ambulating ~17580fith CGA, increased lateral sway with intermittent R toe drag noted. Pt requesting to focus remainder of session on RUE strengthening. Therefore, performed the following there-ex while seated edge of mat table: -2x10 AAROM elbow flex/ext in antigravity position -2x10 AAROM shldr push/upll in antigravity position -2x10 chair push ups with RUE bias  Stand<>pivot with supervision from mat table to w/c and then returned to his room where he remained seated in w/c at end of session with needs in reach. All questions and concerns addressed regarding DC planning.    Mayara Paulson P Disha Cottam PT 07/10/2020, 12:35 PM

## 2020-07-10 NOTE — Discharge Instructions (Signed)
Inpatient Rehab Discharge Instructions  Jacob Short [facility] Discharge date and time: No discharge date for patient encounter.   Activities/Precautions/ Functional Status: Activity: activity as tolerated Diet: diabetic diet Wound Care: Routine skin checks Functional status:  ___ No restrictions     ___ Walk up steps independently ___ 24/7 supervision/assistance   ___ Walk up steps with assistance ___ Intermittent supervision/assistance  ___ Bathe/dress independently ___ Walk with walker     _x__ Bathe/dress with assistance ___ Walk Independently    ___ Shower independently ___ Walk with assistance    ___ Shower with assistance ___ No alcohol     ___ Return to work/school ________  Special Instructions: No driving smoking or alcohol  Plan aspirin 81 mg daily and Brilinta 90 mg twice daily until 07/18/2020 then discontinue Brilinta and begin Plavix 75 mg daily along with aspirin 81 mg daily   COMMUNITY REFERRALS UPON DISCHARGE:   Outpatient: PT & OT             Agency:[facility]  Phone:[phone]              Appointment Date/Time:WILL CALL TO SET UP APPOINTMENTS  Medical Equipment/Items Ordered:ROLLING WALKER & 3 IN 1                                                 Agency/Supplier:[facility] [employer]   [phone]    STROKE/TIA DISCHARGE INSTRUCTIONS SMOKING Cigarette smoking nearly doubles your risk of having a stroke & is the single most alterable risk factor  If you smoke or have smoked in the last 12 months, you are advised to quit smoking for your health.  Most of the excess cardiovascular risk related to smoking disappears within a year of stopping.  Ask you doctor about anti-smoking medications  [place] Quit Line: 1-800-QUIT NOW  Free Smoking Cessation Classes [phone]  CHOLESTEROL Know your levels; limit fat & cholesterol in your diet  Lipid Panel     Component Value Date/Time   CHOL 104 06/15/2020 1822   TRIG 147 06/15/2020 1822   HDL 37 (L)  06/15/2020 1822   CHOLHDL 2.8 06/15/2020 1822   VLDL 29 06/15/2020 1822   LDLCALC 38 06/15/2020 1822      Many patients benefit from treatment even if their cholesterol is at goal.  Goal: Total Cholesterol (CHOL) less than 160  Goal:  Triglycerides (TRIG) less than 150  Goal:  HDL greater than 40  Goal:  LDL (LDLCALC) less than 100   BLOOD PRESSURE American Stroke Association blood pressure target is less that 120/80 mm/Hg  Your discharge blood pressure is:  BP: (!) 159/80  Monitor your blood pressure  Limit your salt and alcohol intake  Many individuals will require more than one medication for high blood pressure  DIABETES (A1c is a blood sugar average for last 3 months) Goal HGBA1c is under 7% (HBGA1c is blood sugar average for last 3 months)  Diabetes:     Lab Results  Component Value Date   HGBA1C 6.9 (H) 06/15/2020     Your HGBA1c can be lowered with medications, healthy diet, and exercise.  Check your blood sugar as directed by your physician  Call your physician if you experience unexplained or low blood sugars.  PHYSICAL ACTIVITY/REHABILITATION Goal is 30 minutes at least 4 days per week  Activity: Increase activity slowly,  Therapies: Physical Therapy: Home Health Return to work:   Activity decreases your risk of heart attack and stroke and makes your heart stronger.  It helps control your weight and blood pressure; helps you relax and can improve your mood.  Participate in a regular exercise program.  Talk with your doctor about the best form of exercise for you (dancing, walking, swimming, cycling).  DIET/WEIGHT Goal is to maintain a healthy weight  Your discharge diet is:  Diet Order            Diet Carb Modified Fluid consistency: Thin; Room service appropriate? Yes  Diet effective now                 liquids Your height is:  Height: 6\' 4"  (193 cm) Your current weight is: Weight: 84.9 kg Your Body Mass Index (BMI) is:  BMI (Calculated): 22.79   Following the type of diet specifically designed for you will help prevent another stroke.  Your goal weight range is:    Your goal Body Mass Index (BMI) is 19-24.  Healthy food habits can help reduce 3 risk factors for stroke:  High cholesterol, hypertension, and excess weight.  RESOURCES Stroke/Support Group:  Call [phone]   STROKE EDUCATION PROVIDED/REVIEWED AND GIVEN TO PATIENT Stroke warning signs and symptoms How to activate emergency medical system (call 911). Medications prescribed at discharge. Need for follow-up after discharge. Personal risk factors for stroke. Pneumonia vaccine given:  Flu vaccine given:  My questions have been answered, the writing is legible, and I understand these instructions.  I will adhere to these goals & educational materials that have been provided to me after my discharge from the hospital.      My questions have been answered and I understand these instructions. I will adhere to these goals and the provided educational materials after my discharge from the hospital.  Patient/Caregiver Signature _______________________________ Date __________  Clinician Signature _______________________________________ Date __________  Please bring this form and your medication list with you to all your follow-up doctor's appointments.

## 2020-07-11 LAB — GLUCOSE, CAPILLARY: Glucose-Capillary: 100 mg/dL — ABNORMAL HIGH (ref 70–99)

## 2020-07-11 NOTE — Progress Notes (Signed)
Recreational Therapy Discharge Summary Patient Details  Name: Jacob Short MRN: [id number] Date of Birth: [birth year] Today's Date: 07/11/2020  Long term goals set: 1  Long term goals met: 0  Comments on progress toward goals: Pt has made good progress during LOS and is discharging home today at supervision ambulatory level.  TR sessions focused on activity analysis/modifications, leisure's impact on all aspects of wellness and coping strategies.  Pt stated understanding of this information and is anxious to return home.  Community mobility goal not met due to time constraints.   Reasons for discharge: discharge from hospital  Patient/family agrees with progress made and goals achieved: Yes  Macy Lingenfelter 07/11/2020, 9:20 AM

## 2020-07-11 NOTE — Progress Notes (Signed)
Pt discharged from hospital with spouse via wheelchair. He is alert and oriented and not showing any signs of distress. No complaints of pain. Pt left with all belongings. Skin warm and intact.

## 2020-07-11 NOTE — Progress Notes (Signed)
[place] PHYSICAL MEDICINE & REHABILITATION PROGRESS NOTE   Subjective/Complaints: Patient seen sitting up in bed this morning.  He states he slept well overnight.  He is ready for discharge.  He states he is discussed with his primary care that he is being discharged today as well as therapies in [place].  ROS: Denies CP, SOB, N/V/D  Objective:   No results found. No results for input(s): WBC, HGB, HCT, PLT in the last 72 hours. Recent Labs    07/10/20 0705  NA 137  K 3.9  CL 104  CO2 25  GLUCOSE 124*  BUN 12  CREATININE 0.70  CALCIUM 9.0    Intake/Output Summary (Last 24 hours) at 07/11/2020 0906 Last data filed at 07/11/2020 0634 Gross per 24 hour  Intake 444 ml  Output 775 ml  Net -331 ml        Physical Exam: Vital Signs Blood pressure (!) 152/70, pulse 76, temperature 98.7 F (37.1 C), resp. rate 17, height 6\' 4"  (1.93 m), weight 78.4 kg, SpO2 96 %.  Constitutional: No distress . Vital signs reviewed. HENT: Normocephalic.  Atraumatic. Eyes: EOMI. No discharge. Cardiovascular: No JVD.  RRR. Respiratory: Normal effort.  No stridor.  Bilateral clear to auscultation. GI: Non-distended.  BS +. Skin: Warm and dry.  Intact. Psych: Normal mood.  Normal behavior. Musc: No edema in extremities.  No tenderness in extremities. Neuro: Alert Right facial droop, stable Motor:  RUE shoulder abduction 4/5, elbow flexion/extension 4/5, wrist extension 2+/5, handgrip 1+/5 RLE: 4+-5/5 proximal to distal  Assessment/Plan: 1. Functional deficits secondary to left corona radiata infarct which require 3+ hours per day of interdisciplinary therapy in a comprehensive inpatient rehab setting.  Physiatrist is providing close team supervision and 24 hour management of active medical problems listed below.  Physiatrist and rehab team continue to assess barriers to discharge/monitor patient progress toward functional and medical goals  Care Tool:  Bathing    Body parts  bathed by patient: Right arm, Chest, Abdomen, Front perineal area, Buttocks, Right upper leg, Left upper leg, Face, Left arm, Left lower leg, Right lower leg   Body parts bathed by helper: Left arm Body parts n/a: Right lower leg, Left lower leg   Bathing assist Assist Level: Supervision/Verbal cueing     Upper Body Dressing/Undressing Upper body dressing   What is the patient wearing?: Pull over shirt    Upper body assist Assist Level: Set up assist    Lower Body Dressing/Undressing Lower body dressing      What is the patient wearing?: Pants, Incontinence brief     Lower body assist Assist for lower body dressing: Contact Guard/Touching assist     Toileting Toileting    Toileting assist Assist for toileting: Contact Guard/Touching assist Assistive Device Comment: bedpan   Transfers Chair/bed transfer  Transfers assist     Chair/bed transfer assist level: Supervision/Verbal cueing Chair/bed transfer assistive device: Programmer, multimedia   Ambulation assist      Assist level: Supervision/Verbal cueing Assistive device: Walker-rolling Max distance: 150   Walk 10 feet activity   Assist     Assist level: Supervision/Verbal cueing Assistive device: Walker-rolling   Walk 50 feet activity   Assist    Assist level: Supervision/Verbal cueing Assistive device: Walker-rolling    Walk 150 feet activity   Assist Walk 150 feet activity did not occur: Safety/medical concerns (fatigue after 33ft)  Assist level: Supervision/Verbal cueing Assistive device: Walker-rolling    Walk 10 feet on uneven  surface  activity   Assist Walk 10 feet on uneven surfaces activity did not occur: Safety/medical concerns   Assist level: Supervision/Verbal cueing Assistive device: Aeronautical engineer Will patient use wheelchair at discharge?: No Type of Wheelchair: Manual    Wheelchair assist level: Minimal Assistance - Patient  > 75% Max wheelchair distance: 5' using L extremities.    Wheelchair 50 feet with 2 turns activity    Assist        Assist Level: Minimal Assistance - Patient > 75%   Wheelchair 150 feet activity     Assist          Medical Problem List and Plan: 1. Right side hemiparesis secondary to left corona radiata infarct secondary small vessel disease  DC today  Will see patient for transitional care management in 1-2 weeks post-discharge  WHO don nightly 2.  Antithrombotics: -DVT/anticoagulation: Lovenox- d/c as ambulating >150 feet             -antiplatelet therapy: Current plan is for aspirin 81 mg daily and Brilinta 90 mg twice daily x30 days then transition back to aspirin and Plavix 3. Pain Management:   Robaxin PRN  Controlled with meds on 10/19 4. Mood: Provide emotional support             -antipsychotic agents: N/A 5. Neuropsych: This patient is capable of making decisions on his own behalf. 6. Skin/Wound Care: Routine skin checks 7. Fluids/Electrolytes/Nutrition: Routine in and outs. 8.  CAD with angioplasty.  Continue aspirin. 9.  Diabetes mellitus with hyperglycemia and peripheral neuropathy.  Hemoglobin A1c 6.9.  Glucophage 1000 mg at breakfast 1500 mg with supper.    Glipizide 2.5 started on 10/1  Controlled on 10/19   CBG (last 3)  Recent Labs    07/09/20 0813 07/10/20 0616 07/11/20 0623  GLUCAP 184* 97 100*    10.  Hyperlipidemia.  Lipitor 11.  Tobacco abuse: Patient states that he has quit.  DCed nicotine patch. 12.  Hypertension.  Cozaar 25 mg daily, increased to 50 on 10/3, increased to 75 on 10/7, increased to 100 on 10/11  Hydralizine 10 TID started on 10/15-monitor effect   Vitals:   07/11/20 0318 07/11/20 0825  BP: 132/77 (!) 152/70  Pulse: 76   Resp: 17   Temp: 98.7 F (37.1 C)   SpO2: 96%    Labile on 10/19, monitor in ambulatory setting with further adjustments as necessary 13.  History of AAA without rupture.  Follow-up  outpatient monitoring.  14.  Hyponatremia  Sodium 137 on 10/18  Continue to monitor 15.  Hypoalbuminemia  Supplement initiated on 9/28 16.  Leukocytosis: Resolved  WBCs 10.3 on 10/15  Afebrile, unchanged  Continue to monitor 17.  Mild acute blood loss anemia: Resolved  Hemoglobin 13.0 on 10/15  Continue to monitor  > 30 minutes spent in total in discharge planning between myself and PA regarding aforementioned, as well discussion regarding DME equipment, follow-up appointments, follow-up therapies, discharge medications, discharge recommendations  LOS: 22 days A FACE TO [place] 07/11/2020, 9:06 AM

## 2020-07-11 NOTE — Progress Notes (Signed)
Resting throughout shift, anticipates discharge home today, denies need for pain medication or ROBAXIN upon questioning by Probation officer. Made as comfortable as possible, Right hand splint applied as ordered, Monitor closely upon unit rounding. and appears asleep in no acute distress or discomfort, respirations even and steady, coloration with no changes. Call bell placed on left side and within reach, bed alarm on, Continue regime

## 2020-07-13 ENCOUNTER — Telehealth: Payer: Self-pay | Admitting: *Deleted

## 2020-07-13 NOTE — Telephone Encounter (Signed)
Transitional Care call--I spoke with his wife Joelene Millin and Mr [facility]    1. Are you/is patient experiencing any problems since coming home? Are there any questions regarding any aspect of care?Joelene Millin called about him having spasms in his right leg. 2. Are there any questions regarding medications administration/dosing? Are meds being taken as prescribed? Patient should review meds with caller to confirm NO, he has all his medications except the Robaxin and I have let them know it was sent to his [facility] in [place] and he should take it to help with the spasms. 3. Have there been any falls? NO 4. Has Home Health been to the house and/or have they contacted you? If not, have you tried to contact them? Can we help you contact them? NO HH ordered. His outpt PT/OT has contacted him and he starts Monday. 5. Are bowels and bladder emptying properly? Are there any unexpected incontinence issues? If applicable, is patient following bowel/bladder programs? NO problems 6. Any fevers, problems with breathing, unexpected pain? NO 7. Are there any skin problems or new areas of breakdown?NO 8. Has the patient/family member arranged specialty MD follow up (ie cardiology/neurology/renal/surgical/etc)?  Can we help arrange?They do not have neurologist and I have let them know a referral was placed to [facility] and they should call him. 9. Does the patient need any other services or support that we can help arrange? NO, he did ask about who discharged him and I gave them Silvestre Mesi PA and if they had questions for him they can call 4th floor rehab and have him paged. 10. Are caregivers following through as expected in assisting the patient? YES 11. Has the patient quit smoking, drinking alcohol, or using drugs as recommended?YES  Appointment Tuesday 07/18/20 @10 :00 arrive by 9:30 to see Dr Posey Pronto. No packet mailed due to short time span, address reviewed. [place]

## 2020-07-17 DIAGNOSIS — R2689 Other abnormalities of gait and mobility: Secondary | ICD-10-CM | POA: Diagnosis not present

## 2020-07-17 DIAGNOSIS — Z8673 Personal history of transient ischemic attack (TIA), and cerebral infarction without residual deficits: Secondary | ICD-10-CM | POA: Diagnosis not present

## 2020-07-17 DIAGNOSIS — G8191 Hemiplegia, unspecified affecting right dominant side: Secondary | ICD-10-CM | POA: Diagnosis not present

## 2020-07-17 DIAGNOSIS — M6281 Muscle weakness (generalized): Secondary | ICD-10-CM | POA: Diagnosis not present

## 2020-07-17 DIAGNOSIS — R2681 Unsteadiness on feet: Secondary | ICD-10-CM | POA: Diagnosis not present

## 2020-07-17 DIAGNOSIS — R278 Other lack of coordination: Secondary | ICD-10-CM | POA: Diagnosis not present

## 2020-07-18 ENCOUNTER — Encounter: Payer: PPO | Attending: Registered Nurse | Admitting: Registered Nurse

## 2020-07-18 ENCOUNTER — Other Ambulatory Visit: Payer: Self-pay

## 2020-07-18 ENCOUNTER — Encounter: Payer: PPO | Admitting: Physical Medicine & Rehabilitation

## 2020-07-18 VITALS — BP 144/79 | HR 69 | Temp 98.2°F | Ht 76.0 in | Wt 174.4 lb

## 2020-07-18 DIAGNOSIS — I10 Essential (primary) hypertension: Secondary | ICD-10-CM | POA: Diagnosis not present

## 2020-07-18 DIAGNOSIS — I679 Cerebrovascular disease, unspecified: Secondary | ICD-10-CM | POA: Diagnosis not present

## 2020-07-18 DIAGNOSIS — E1165 Type 2 diabetes mellitus with hyperglycemia: Secondary | ICD-10-CM | POA: Diagnosis not present

## 2020-07-18 NOTE — Progress Notes (Signed)
Subjective:    Patient ID: Jacob Short, male    DOB: Jan 26, 1946, 74 y.o.   MRN: [id number]  HPI: Jacob Short is a 74 y.o. male who is here for Transitional Care Visit in follow of of his Small vessel disease ,cerebrovascular, Essential Hypertension and Controlled Typ 2 DM, without long-term current use of insulin. Jacob Short present to [facility] on 06/15/2020 via EMS, with complaints of right sided facial weakness and right arm weakness. Neurology Consulted.   CT Head Code Stroke WO Contrast:  IMPRESSION: 1. No acute finding.ASPECTS is 10. 2. Chronic lacunar infarct at the right corona radiata.  CT Code Stroke CTA Head W/WO Contrast:  1. No emergent finding. Negative for large vessel occlusion or flow reducing stenosis. 2. Moderate atherosclerosis in the head and neck.  CT perfusion:  No detected infarct. There is a 7 cc area highlighted in the inferior left temporal lobe, possibly artifactual given the provided history and location along the skull base.  MR Brain WO Contrast:  IMPRESSION: Acute lacunar infarct at the left corona radiata.  Jacob Short was admitted to inpatient Rehabilitation on 06/19/2020 and discharged home on 07/11/2020. [name] is receiving outpatient therapy at [facility]. He denies any pain. He rates his pain 0. Also reports he has a good appetite.   Jacob Short in room    Pain Inventory Average Pain 0 Pain Right Now 0 My pain is na  LOCATION OF PAIN    BOWEL Number of stools per week: 3 Oral laxative use No  Type of laxative na Enema or suppository use No  History of colostomy No  Incontinent No   BLADDER Normal In and out cath, frequency na Able to self cath No  Bladder incontinence No  Frequent urination No  Leakage with coughing No  Difficulty starting stream No  Incomplete bladder emptying No    Mobility use a walker how many minutes can you walk? 15 ability to climb steps?   yes do you drive?  no  Function retired I need assistance with the following:  dressing, bathing, toileting, meal prep, household duties and shopping  Neuro/Psych trouble walking spasms  Prior Studies TC appt  Physicians involved in your care TC appt   No family history on file. Social History   Socioeconomic History  . Marital status: Married    Spouse name: Not on file  . Number of children: Not on file  . Years of education: Not on file  . Highest education level: Not on file  Occupational History  . Not on file  Tobacco Use  . Smoking status: Former Smoker    Quit date: 06/15/2020    Years since quitting: 0.0  . Smokeless tobacco: Current User  Vaping Use  . Vaping Use: Never used  Substance and Sexual Activity  . Alcohol use: Not Currently  . Drug use: Not Currently  . Sexual activity: Not on file  Other Topics Concern  . Not on file  Social History Narrative  . Not on file   Social Determinants of Health   Financial Resource Strain:   . Difficulty of Paying Living Expenses: Not on file  Food Insecurity:   . Worried About Charity fundraiser in the Last Year: Not on file  . Ran Out of Food in the Last Year: Not on file  Transportation Needs:   . Lack of Transportation (Medical): Not on file  . Lack of Transportation (Non-Medical): Not on file  Physical Activity:   . Days of Exercise per Week: Not on file  . Minutes of Exercise per Session: Not on file  Stress:   . Feeling of Stress : Not on file  Social Connections:   . Frequency of Communication with Friends and Family: Not on file  . Frequency of Social Gatherings with Friends and Family: Not on file  . Attends Religious Services: Not on file  . Active Member of Clubs or Organizations: Not on file  . Attends Archivist Meetings: Not on file  . Marital Status: Not on file   Past Surgical History:  Procedure Laterality Date  . CORONARY ANGIOPLASTY     Past Medical History:   Diagnosis Date  . Coronary artery disease   . Diabetes mellitus without complication ([place])   . Peripheral vascular disease ([facility])    BP (!) 144/79   Pulse 69   Temp 98.2 F (36.8 C)   Ht 6\' 4"  (1.93 m)   Wt 174 lb 6.4 oz (79.1 kg)   SpO2 97%   BMI 21.23 kg/m   Opioid Risk Score:   Fall Risk Score:  `1  Depression screen PHQ 2/9  Depression screen PHQ 2/9 07/18/2020  Decreased Interest 0  Down, Depressed, Hopeless 0  PHQ - 2 Score 0     Review of Systems  Musculoskeletal: Positive for gait problem.       Spasms  All other systems reviewed and are negative.      Objective:   Physical Exam Vitals and nursing note reviewed.  Constitutional:      Appearance: Normal appearance.  Cardiovascular:     Rate and Rhythm: Normal rate and regular rhythm.     Pulses: Normal pulses.     Heart sounds: Normal heart sounds.  Pulmonary:     Effort: Pulmonary effort is normal.     Breath sounds: Normal breath sounds.  Musculoskeletal:     Cervical back: Normal range of motion and neck supple.     Comments: Normal Muscle Bulk and Muscle Testing Reveals:  Upper Extremities: Full ROM and Muscle Strength on Right 1/5 and Left 5/5  Lower Extremities: Full ROM and Muscle Strength 5/5 Arises from Table Slowly using walker for support Narrow Based  Gait   Skin:    General: Skin is warm and dry.  Neurological:     Mental Status: He is alert and oriented to person, place, and time.  Psychiatric:        Mood and Affect: Mood normal.        Behavior: Behavior normal.           Assessment & Plan:  1. Small vessel disease ,cerebrovascular: Continue current medication regimen. He has a scheduled appointment with Neurology. Continue outpatient therapy at [facility].  2. Essential Hypertension: Continue current medication regimen. PCP Following. Continue to Monitor.  3. Controlled Typ 2 DM, without long-term current use of insulin. Continue current medication  regimen. PCP Following.   20 minutes of face to face patient care time was spent during this visit. All questions were encouraged and answered.  He has a F/U appointment with Dr Posey Pronto in 4-6 weeks

## 2020-07-24 ENCOUNTER — Encounter: Payer: Self-pay | Admitting: Registered Nurse

## 2020-07-25 DIAGNOSIS — Z8673 Personal history of transient ischemic attack (TIA), and cerebral infarction without residual deficits: Secondary | ICD-10-CM | POA: Diagnosis not present

## 2020-07-25 DIAGNOSIS — I679 Cerebrovascular disease, unspecified: Secondary | ICD-10-CM | POA: Diagnosis not present

## 2020-07-26 DIAGNOSIS — G8191 Hemiplegia, unspecified affecting right dominant side: Secondary | ICD-10-CM | POA: Diagnosis not present

## 2020-07-26 DIAGNOSIS — R2681 Unsteadiness on feet: Secondary | ICD-10-CM | POA: Diagnosis not present

## 2020-07-26 DIAGNOSIS — R2689 Other abnormalities of gait and mobility: Secondary | ICD-10-CM | POA: Diagnosis not present

## 2020-07-26 DIAGNOSIS — Z8673 Personal history of transient ischemic attack (TIA), and cerebral infarction without residual deficits: Secondary | ICD-10-CM | POA: Diagnosis not present

## 2020-07-26 DIAGNOSIS — R278 Other lack of coordination: Secondary | ICD-10-CM | POA: Diagnosis not present

## 2020-07-26 DIAGNOSIS — M6281 Muscle weakness (generalized): Secondary | ICD-10-CM | POA: Diagnosis not present

## 2020-08-09 ENCOUNTER — Ambulatory Visit: Payer: PPO | Admitting: Adult Health

## 2020-08-09 ENCOUNTER — Other Ambulatory Visit: Payer: Self-pay

## 2020-08-09 ENCOUNTER — Encounter: Payer: Self-pay | Admitting: Adult Health

## 2020-08-09 VITALS — BP 130/75 | HR 67 | Ht 76.0 in | Wt 176.4 lb

## 2020-08-09 DIAGNOSIS — I10 Essential (primary) hypertension: Secondary | ICD-10-CM | POA: Diagnosis not present

## 2020-08-09 DIAGNOSIS — E785 Hyperlipidemia, unspecified: Secondary | ICD-10-CM

## 2020-08-09 DIAGNOSIS — E119 Type 2 diabetes mellitus without complications: Secondary | ICD-10-CM

## 2020-08-09 DIAGNOSIS — I639 Cerebral infarction, unspecified: Secondary | ICD-10-CM | POA: Diagnosis not present

## 2020-08-09 DIAGNOSIS — Z87891 Personal history of nicotine dependence: Secondary | ICD-10-CM

## 2020-08-09 MED ORDER — ASPIRIN EC 325 MG PO TBEC: 325.0000 mg | DELAYED_RELEASE_TABLET | Freq: Every day | ORAL | 0 refills | Status: DC

## 2020-08-09 NOTE — Patient Instructions (Signed)
Continue working with therapies for likely ongoing improvement   Continue aspirin 325 mg daily and clopidogrel 75 mg daily  and atorvastatin  for secondary stroke prevention  Continue to follow up with PCP regarding cholesterol, blood pressure and diabetes management  Maintain strict control of hypertension with blood pressure goal below 130/90, diabetes with hemoglobin A1c goal below 7% and cholesterol with LDL cholesterol (bad cholesterol) goal below 70 mg/dL.      Followup in the future with me in 3 months or call earlier if needed     Thank you for coming to see [place] at [facility]. I hope we have been able to provide you high quality care today.  You may receive a patient satisfaction survey over the next few weeks. We would appreciate your feedback and comments so that we may continue to improve ourselves and the health of our patients.

## 2020-08-09 NOTE — Progress Notes (Signed)
[facility] [address] [place]. [place] [address] [phone]       HOSPITAL FOLLOW UP NOTE  Jacob Short [facility] Date of Birth:  10/02/1945 Medical Record Number:  [id number]   Reason for Referral:  hospital stroke follow up    SUBJECTIVE:   CHIEF COMPLAINT:  Chief Complaint  Patient presents with  . Hospitalization Follow-up    HFU after stroke, states he has been doing well since being home. Has been working out at home to regain strength on right side. Has had 2 falls at home.   . treatment room    with wife     HPI:   [name] is a 74 y.o. male with history of diabetes, hypertension, peripheral vascular disease, abdominal aortic aneurysm who presented on 06/15/2020 with R arm weakness and neglect.  Personally reviewed hospitalization pertinent progress notes, lab work and imaging with summary provided.  Evaluated by Dr. Erlinda Hong with stroke work-up revealing left corona radiata infarct secondary to small vessel disease source.  On aspirin 325 mg daily and Plavix 75 mg daily PTA therefore recommended 30 days of aspirin 81 mg daily and Brilinta 90 mg twice daily and then transition back to aspirin and Plavix. Hx of HTN stable during admission. Hx of HLD with LDL 38 and recommended continuation of Crestor 10 mg daily.  Controlled DM with A1c 6.9.  Current tobacco use with smoking cessation counseling provided.  Other stroke risk factors include advanced age, EtOH use, CAD s/p PCI, AAA repair 2018 and PAD w/ stent in leg.  No prior stroke history.  Other active problems include iliac artery aneurysm.  Evaluated by therapies who recommended discharge to CIR for ongoing therapy needs and discharged to CIR on 06/19/2020.  Stroke:   L corona radiata infarct secondary to small vessel disease source  CT head No acute abnormality. Old R corona radiata lacune. ASPECTS 10.     CTA head & neck no LVO. Moderate atherosclerosis   CT perfusion no core. L temporal  lobe w/ 1mm likely artifactual  MRI 9/23  Acute L corona radiata infarct   MRI 9/24 no change  2D Echo EF 55-60%. No source of embolus   LDL 38  HgbA1c 6.9  VTE prophylaxis - Lovenox 40 mg sq daily   aspirin 325 mg daily and clopidogrel 75 mg daily prior to admission, now on aspirin 325 mg daily and clopidogrel 75 mg daily. Given failure of aspirin and plavix, recommend treatment with aspirin 81 and Brilinta 90 bid x 30 days then transition back to aspirin and plavix. insurance card availalbe via TOC $5 for 30d supply   Therapy recommendations:  CIR  Disposition:  CIR  Today, 08/09/2020, [name] is being seen for hospital follow-up accompanied by his wife.  After 22 days, he was discharged from CIR to home on 07/11/2020.  Reports residual right arm weakness and gait impairment.  Currently working with [facility] PT/OT twice weekly with ongoing improvement.  Currently ambulating with rolling walker but has been using a cane during therapy sessions.  His wife has been assisting with ADLs due to left arm weakness.  Denies new or worsening stroke/TIA symptoms.  Completed 30 days of aspirin and Brilinta combination and has transition back to aspirin 325 mg daily and Plavix 75 mg daily without bleeding or bruising.  Remains on atorvastatin 80 mg daily without myalgias.  Blood pressure today satisfactory 130/75.  He reports complete tobacco cessation since hospital discharge.  No  further concerns at this time.     ROS:   14 system review of systems performed and negative with exception of those listed in HPI  PMH:  Past Medical History:  Diagnosis Date  . Coronary artery disease   . Diabetes mellitus without complication ([place])   . Peripheral vascular disease (HCC)     PSH:  Past Surgical History:  Procedure Laterality Date  . CORONARY ANGIOPLASTY      Social History:  Social History   Socioeconomic History  . Marital status: Married    Spouse name: Not on file  .  Number of children: Not on file  . Years of education: Not on file  . Highest education level: Not on file  Occupational History  . Not on file  Tobacco Use  . Smoking status: Former Smoker    Quit date: 06/15/2020    Years since quitting: 0.1  . Smokeless tobacco: Current User  Vaping Use  . Vaping Use: Never used  Substance and Sexual Activity  . Alcohol use: Not Currently  . Drug use: Not Currently  . Sexual activity: Not on file  Other Topics Concern  . Not on file  Social History Narrative  . Not on file   Social Determinants of Health   Financial Resource Strain:   . Difficulty of Paying Living Expenses: Not on file  Food Insecurity:   . Worried About Charity fundraiser in the Last Year: Not on file  . Ran Out of Food in the Last Year: Not on file  Transportation Needs:   . Lack of Transportation (Medical): Not on file  . Lack of Transportation (Non-Medical): Not on file  Physical Activity:   . Days of Exercise per Week: Not on file  . Minutes of Exercise per Session: Not on file  Stress:   . Feeling of Stress : Not on file  Social Connections:   . Frequency of Communication with Friends and Family: Not on file  . Frequency of Social Gatherings with Friends and Family: Not on file  . Attends Religious Services: Not on file  . Active Member of Clubs or Organizations: Not on file  . Attends Archivist Meetings: Not on file  . Marital Status: Not on file  Intimate Partner Violence:   . Fear of Current or Ex-Partner: Not on file  . Emotionally Abused: Not on file  . Physically Abused: Not on file  . Sexually Abused: Not on file    Family History: No family history on file.  Medications:   Current Outpatient Medications on File Prior to Visit  Medication Sig Dispense Refill  . acetaminophen (TYLENOL) 325 MG tablet Take 2 tablets (650 mg total) by mouth every 6 (six) hours as needed for mild pain (or Fever >/= 101).    Marland Kitchen atorvastatin (LIPITOR) 80 MG  tablet Take 1 tablet (80 mg total) by mouth daily. 30 tablet 0  . carvedilol (COREG) 12.5 MG tablet Take 12.5 mg by mouth 2 (two) times daily.    . clopidogrel (PLAVIX) 75 MG tablet Take 1 tablet (75 mg total) by mouth daily. 30 tablet 0  . glipiZIDE (GLUCOTROL) 5 MG tablet Take 0.5 tablets (2.5 mg total) by mouth daily before breakfast. 30 tablet 0  . hydrALAZINE (APRESOLINE) 10 MG tablet Take 1 tablet (10 mg total) by mouth every 8 (eight) hours. 90 tablet 0  . losartan (COZAAR) 25 MG tablet Take 25 mg by mouth daily.    . metFORMIN (  GLUCOPHAGE) 500 MG tablet Take 2-3 tablets (1,000-1,500 mg total) by mouth See admin instructions. Take 1000mg  in the morning and 1500mg  in the evening. 180 tablet 0  . methocarbamol (ROBAXIN) 500 MG tablet Take 1 tablet (500 mg total) by mouth every 6 (six) hours as needed for muscle spasms. 30 tablet 0   No current facility-administered medications on file prior to visit.    Allergies:  No Known Allergies    OBJECTIVE:  Physical Exam  Vitals:   08/09/20 1524  BP: 130/75  Pulse: 67  Weight: 176 lb 6.4 oz (80 kg)  Height: 6\' 4"  (1.93 m)   Body mass index is 21.47 kg/m. No exam data present  Post stroke PHQ 2/9 Depression screen PHQ 2/9 08/09/2020  Decreased Interest 0  Down, Depressed, Hopeless 0  PHQ - 2 Score 0     General: well developed, well nourished,  very pleasant elderly Caucasian male, seated, in no evident distress Head: head normocephalic and atraumatic.   Neck: supple with no carotid or supraclavicular bruits Cardiovascular: regular rate and rhythm, no murmurs Musculoskeletal: no deformity Skin:  no rash/petichiae Vascular:  Normal pulses all extremities   Neurologic Exam Mental Status: Awake and fully alert.   Fluent speech and language.  Oriented to place and time. Recent and remote memory intact. Attention span, concentration and fund of knowledge appropriate. Mood and affect appropriate.  Cranial Nerves: Fundoscopic exam  reveals sharp disc margins. Pupils equal, briskly reactive to light. Extraocular movements full without nystagmus. Visual fields full to confrontation. Hearing intact. Facial sensation intact.  Very slight left lower facial weakness.  Tongue, palate moves normally and symmetrically.  Motor: Normal bulk and tone and strength RUE and RLE.  LUE: 4/5 proximal and 2-3/5 hand with increased swelling.  LLE: 5/5 Sensory.: intact to touch , pinprick , position and vibratory sensation.  Coordination: Rapid alternating movements normal in all extremities except left hand. Finger-to-nose performed accurately RUE and heel-to-shin performed accurately bilaterally. Gait and Station: Arises from chair without difficulty. Stance is normal. Gait demonstrates normal stride length and balance with use of rolling walker. Reflexes: 1+ and symmetric. Toes downgoing.     NIHSS  1 Modified Rankin  3      ASSESSMENT: Jacob Short is a 74 y.o. year old male presented with right arm weakness and neglect on 06/15/2020 with stroke work-up revealed left corona radiata infarct secondary to small vessel disease. Vascular risk factors include HTN, HLD, DM, tobacco use, EtOH use, CAD s/p PCI, AAA s/p repair 2018 and PAD w/ stent in leg.      PLAN:  1. L CR stroke :  a. Residual deficit: LUE weakness and gait impairment.  Ongoing improvement working with [facility] PT[facility]OT.  Discussed possible participation in TRANSPORT 2 trial at [facility] -provided additional information. b. Continue aspirin 325 mg daily and clopidogrel 75 mg daily  and atorvastatin 80 mg daily for secondary stroke prevention.  c. Discussed secondary stroke prevention measures and importance of close PCP follow up for aggressive stroke risk factor management  2. HTN: BP goal <130/90.  Stable on losartan, hydralazine and carvedilol per PCP 3. HLD: LDL goal <70. Recent LDL 38.  Transition from Crestor to atorvastatin 80 mg daily during stroke  admission.  Request ongoing prescribing and monitoring via PCP 4. DMII: A1c goal<7.0. Recent A1c 6.9.  Well-controlled on Metformin and glipizide per PCP 5. Tobacco use: Congratulated on complete tobacco cessation and highly encouraged continuation    Follow  up in 3 months or call earlier if needed   CC:  [facility] provider: Dr. Luberta Mutter, MD    I spent 45 minutes of face-to-face and non-face-to-face time with patient and wife.  This included previsit chart review including recent hospitalization pertinent progress notes, lab work and imaging, lab review, study review, order entry, electronic health record documentation, patient education regarding recent stroke, residual deficits, importance of managing stroke risk factors and answered all questions to patient satisfaction    Frann Rider, [facility]  [facility] [address] [place], [place] [address]  Phone [phone] Fax [phone] Note: This document was prepared with digital dictation and possible smart phrase technology. Any transcriptional errors that result from this process are unintentional.

## 2020-08-10 DIAGNOSIS — H9190 Unspecified hearing loss, unspecified ear: Secondary | ICD-10-CM | POA: Diagnosis not present

## 2020-08-14 NOTE — Progress Notes (Signed)
I agree with the above plan 

## 2020-08-22 ENCOUNTER — Telehealth: Payer: Self-pay | Admitting: Physical Medicine & Rehabilitation

## 2020-08-22 ENCOUNTER — Encounter: Payer: Self-pay | Admitting: Physical Medicine & Rehabilitation

## 2020-08-22 ENCOUNTER — Other Ambulatory Visit: Payer: Self-pay

## 2020-08-22 ENCOUNTER — Encounter: Payer: PPO | Attending: Registered Nurse | Admitting: Physical Medicine & Rehabilitation

## 2020-08-22 VITALS — BP 158/89 | HR 61 | Temp 98.3°F | Ht 76.0 in | Wt 179.0 lb

## 2020-08-22 DIAGNOSIS — I679 Cerebrovascular disease, unspecified: Secondary | ICD-10-CM | POA: Diagnosis not present

## 2020-08-22 DIAGNOSIS — M62838 Other muscle spasm: Secondary | ICD-10-CM | POA: Diagnosis not present

## 2020-08-22 DIAGNOSIS — G8191 Hemiplegia, unspecified affecting right dominant side: Secondary | ICD-10-CM | POA: Diagnosis not present

## 2020-08-22 DIAGNOSIS — I10 Essential (primary) hypertension: Secondary | ICD-10-CM | POA: Insufficient documentation

## 2020-08-22 DIAGNOSIS — R269 Unspecified abnormalities of gait and mobility: Secondary | ICD-10-CM | POA: Insufficient documentation

## 2020-08-22 DIAGNOSIS — R600 Localized edema: Secondary | ICD-10-CM | POA: Insufficient documentation

## 2020-08-22 MED ORDER — METHOCARBAMOL 500 MG PO TABS: 500.0000 mg | ORAL_TABLET | Freq: Two times a day (BID) | ORAL | 0 refills | Status: DC | PRN

## 2020-08-22 NOTE — Telephone Encounter (Signed)
Patient was in office today and they called about a Doppler.  I do not see an order in system for this.  Please advise.

## 2020-08-22 NOTE — Addendum Note (Signed)
Addended by: Delice Lesch A on: 08/22/2020 12:30 PM   Modules accepted: Orders

## 2020-08-22 NOTE — Progress Notes (Addendum)
Subjective:    Patient ID: Jacob Short, male    DOB: 1945-12-08, 74 y.o.   MRN: [id number]  HPI Right-handed male with history of CAD and angioplasty, diabetes mellitus, tobacco abuse, AAA, hyperlipidemia presents for follow up for left corona radiata infarct secondary small vessel disease   Last clinic visit with NP on 07/18/20, notes reviewed.  Wife supplements history. Since that time, pt states he transitioned to Plavix and ASA. Denies pain. He notes muscle spasms in his RUE, notes improvement with Robaxin.  CBGs have been controlled. BP is elevated today.  Therapies: 2/week DME: Home previously modified Mobility: Cane at all times  Pain Inventory Average Pain 0 Pain Right Now 0 My pain is no pain  LOCATION OF PAIN  No pain  BOWEL Number of stools per week: 7 Oral laxative use No  Type of laxative n/a Enema or suppository use No  History of colostomy No  Incontinent No   BLADDER Normal In and out cath, frequency n/a Able to self cath n/a Bladder incontinence No  Frequent urination No  Leakage with coughing No  Difficulty starting stream No  Incomplete bladder emptying No    Mobility walk with assistance use a cane ability to climb steps?  yes do you drive?  no  Function retired  Neuro/Psych trouble walking spasms  Prior Studies Any changes since last visit?  no  Physicians involved in your care Any changes since last visit?  no   History reviewed. No pertinent family history. Social History   Socioeconomic History  . Marital status: Married    Spouse name: Not on file  . Number of children: Not on file  . Years of education: Not on file  . Highest education level: Not on file  Occupational History  . Not on file  Tobacco Use  . Smoking status: Former Smoker    Quit date: 06/15/2020    Years since quitting: 0.1  . Smokeless tobacco: Current User  Vaping Use  . Vaping Use: Never used  Substance and Sexual Activity  . Alcohol use:  Not Currently  . Drug use: Not Currently  . Sexual activity: Not on file  Other Topics Concern  . Not on file  Social History Narrative  . Not on file   Social Determinants of Health   Financial Resource Strain:   . Difficulty of Paying Living Expenses: Not on file  Food Insecurity:   . Worried About Charity fundraiser in the Last Year: Not on file  . Ran Out of Food in the Last Year: Not on file  Transportation Needs:   . Lack of Transportation (Medical): Not on file  . Lack of Transportation (Non-Medical): Not on file  Physical Activity:   . Days of Exercise per Week: Not on file  . Minutes of Exercise per Session: Not on file  Stress:   . Feeling of Stress : Not on file  Social Connections:   . Frequency of Communication with Friends and Family: Not on file  . Frequency of Social Gatherings with Friends and Family: Not on file  . Attends Religious Services: Not on file  . Active Member of Clubs or Organizations: Not on file  . Attends Archivist Meetings: Not on file  . Marital Status: Not on file   Past Surgical History:  Procedure Laterality Date  . CORONARY ANGIOPLASTY     Past Medical History:  Diagnosis Date  . Coronary artery disease   . Diabetes  mellitus without complication ([place])   . Peripheral vascular disease (HCC)    BP (!) 158/89   Pulse 61   Temp 98.3 F (36.8 C)   Ht 6\' 4"  (1.93 m)   Wt 179 lb (81.2 kg)   SpO2 98%   BMI 21.79 kg/m   Opioid Risk Score:   Fall Risk Score:  `1  Depression screen PHQ 2/9  Depression screen [facility] 2/9 08/09/2020 07/18/2020  Decreased Interest 0 0  Down, Depressed, Hopeless 0 0  PHQ - 2 Score 0 0    Review of Systems  Constitutional: Negative.   HENT: Negative.   Eyes: Negative.   Respiratory: Negative.   Cardiovascular: Negative.   Gastrointestinal: Negative.   Endocrine: Negative.   Musculoskeletal: Positive for arthralgias, gait problem and myalgias.       Hand swelling  Spasms   Skin:  Negative.   Neurological: Positive for weakness.  Psychiatric/Behavioral: Negative.   All other systems reviewed and are negative.    Objective:   Physical Exam  Constitutional: No distress . Vital signs reviewed. HENT: Normocephalic.  Atraumatic. Eyes: EOMI. No discharge. Cardiovascular: No JVD.   Respiratory: Normal effort.  No stridor.   GI: Non-distended.   Skin: Warm and dry.  Intact. Psych: Normal mood.  Normal behavior. Musc: Right hand edema. No tenderness in extremities. Gait: Mild/Mod Step to gait Neuro: Alert Right facial droop, unchanged Motor:  RUE shoulder abduction 4/5, elbow flexion/extension 4/5, wrist extension 2+/5, handgrip 2-/5 RLE: 4+-5/5 proximal to distal    Assessment & Plan:  Right-handed male with history of CAD and angioplasty, diabetes mellitus, tobacco abuse, AAA, hyperlipidemia presents for follow up for left corona radiata infarct secondary small vessel disease  1. Right side hemiparesis secondary to left corona radiata infarct secondary small vessel disease  Continue therapies  Continue follow up Neurology  Encouraged WHO   2. Muscle spasms:              Robaxin PRN  3.  Hypertension.    Cont meds  Elevated today, however, better controlled at home per pt and wife  4. Gait abnormality  Cont therapies  Cont cane for safety  5. Right hand edema  Likely secondary to stroke, however, given weakness and increasing edema will order doppler  Encouraged bracing, glove  Encouraged elevation

## 2020-08-23 DIAGNOSIS — M79601 Pain in right arm: Secondary | ICD-10-CM | POA: Diagnosis not present

## 2020-08-23 DIAGNOSIS — R600 Localized edema: Secondary | ICD-10-CM | POA: Diagnosis not present

## 2020-08-23 NOTE — Telephone Encounter (Signed)
Please see orders for the visit, "VAS [place] UPPER EXTREMITY VENOUS DUPLEX".  Thanks.

## 2020-08-28 DIAGNOSIS — R2681 Unsteadiness on feet: Secondary | ICD-10-CM | POA: Diagnosis not present

## 2020-08-28 DIAGNOSIS — M6281 Muscle weakness (generalized): Secondary | ICD-10-CM | POA: Diagnosis not present

## 2020-08-28 DIAGNOSIS — G8191 Hemiplegia, unspecified affecting right dominant side: Secondary | ICD-10-CM | POA: Diagnosis not present

## 2020-08-28 DIAGNOSIS — R278 Other lack of coordination: Secondary | ICD-10-CM | POA: Diagnosis not present

## 2020-08-28 DIAGNOSIS — I679 Cerebrovascular disease, unspecified: Secondary | ICD-10-CM | POA: Diagnosis not present

## 2020-08-28 DIAGNOSIS — R2689 Other abnormalities of gait and mobility: Secondary | ICD-10-CM | POA: Diagnosis not present

## 2020-09-19 ENCOUNTER — Encounter: Payer: PPO | Attending: Registered Nurse | Admitting: Physical Medicine & Rehabilitation

## 2020-09-19 DIAGNOSIS — I10 Essential (primary) hypertension: Secondary | ICD-10-CM | POA: Insufficient documentation

## 2020-09-19 DIAGNOSIS — G8191 Hemiplegia, unspecified affecting right dominant side: Secondary | ICD-10-CM | POA: Insufficient documentation

## 2020-09-19 DIAGNOSIS — I679 Cerebrovascular disease, unspecified: Secondary | ICD-10-CM | POA: Insufficient documentation

## 2020-09-19 DIAGNOSIS — M62838 Other muscle spasm: Secondary | ICD-10-CM | POA: Insufficient documentation

## 2020-09-19 DIAGNOSIS — R269 Unspecified abnormalities of gait and mobility: Secondary | ICD-10-CM | POA: Insufficient documentation

## 2020-09-19 DIAGNOSIS — R600 Localized edema: Secondary | ICD-10-CM | POA: Insufficient documentation

## 2020-10-16 DIAGNOSIS — Z8673 Personal history of transient ischemic attack (TIA), and cerebral infarction without residual deficits: Secondary | ICD-10-CM | POA: Diagnosis not present

## 2020-10-16 DIAGNOSIS — G8191 Hemiplegia, unspecified affecting right dominant side: Secondary | ICD-10-CM | POA: Diagnosis not present

## 2020-10-25 DIAGNOSIS — I679 Cerebrovascular disease, unspecified: Secondary | ICD-10-CM | POA: Diagnosis not present

## 2020-10-25 DIAGNOSIS — M6281 Muscle weakness (generalized): Secondary | ICD-10-CM | POA: Diagnosis not present

## 2020-10-25 DIAGNOSIS — G8191 Hemiplegia, unspecified affecting right dominant side: Secondary | ICD-10-CM | POA: Diagnosis not present

## 2020-10-25 DIAGNOSIS — R278 Other lack of coordination: Secondary | ICD-10-CM | POA: Diagnosis not present

## 2020-10-25 DIAGNOSIS — R2689 Other abnormalities of gait and mobility: Secondary | ICD-10-CM | POA: Diagnosis not present

## 2020-10-25 DIAGNOSIS — R2681 Unsteadiness on feet: Secondary | ICD-10-CM | POA: Diagnosis not present

## 2020-10-30 DIAGNOSIS — E7800 Pure hypercholesterolemia, unspecified: Secondary | ICD-10-CM | POA: Diagnosis not present

## 2020-10-30 DIAGNOSIS — I2510 Atherosclerotic heart disease of native coronary artery without angina pectoris: Secondary | ICD-10-CM | POA: Diagnosis not present

## 2020-10-30 DIAGNOSIS — Z6822 Body mass index (BMI) 22.0-22.9, adult: Secondary | ICD-10-CM | POA: Diagnosis not present

## 2020-10-30 DIAGNOSIS — I10 Essential (primary) hypertension: Secondary | ICD-10-CM | POA: Diagnosis not present

## 2020-10-30 DIAGNOSIS — Z87891 Personal history of nicotine dependence: Secondary | ICD-10-CM | POA: Diagnosis not present

## 2020-10-30 DIAGNOSIS — Z1339 Encounter for screening examination for other mental health and behavioral disorders: Secondary | ICD-10-CM | POA: Diagnosis not present

## 2020-10-30 DIAGNOSIS — I679 Cerebrovascular disease, unspecified: Secondary | ICD-10-CM | POA: Diagnosis not present

## 2020-10-30 DIAGNOSIS — Z1331 Encounter for screening for depression: Secondary | ICD-10-CM | POA: Diagnosis not present

## 2020-10-30 DIAGNOSIS — Z0000 Encounter for general adult medical examination without abnormal findings: Secondary | ICD-10-CM | POA: Diagnosis not present

## 2020-10-30 DIAGNOSIS — E1159 Type 2 diabetes mellitus with other circulatory complications: Secondary | ICD-10-CM | POA: Diagnosis not present

## 2020-10-30 DIAGNOSIS — N400 Enlarged prostate without lower urinary tract symptoms: Secondary | ICD-10-CM | POA: Diagnosis not present

## 2020-11-14 DIAGNOSIS — I69351 Hemiplegia and hemiparesis following cerebral infarction affecting right dominant side: Secondary | ICD-10-CM | POA: Diagnosis not present

## 2020-11-16 DIAGNOSIS — I69351 Hemiplegia and hemiparesis following cerebral infarction affecting right dominant side: Secondary | ICD-10-CM | POA: Diagnosis not present

## 2020-11-22 DIAGNOSIS — M6281 Muscle weakness (generalized): Secondary | ICD-10-CM | POA: Diagnosis not present

## 2020-11-22 DIAGNOSIS — I679 Cerebrovascular disease, unspecified: Secondary | ICD-10-CM | POA: Diagnosis not present

## 2020-11-22 DIAGNOSIS — R2681 Unsteadiness on feet: Secondary | ICD-10-CM | POA: Diagnosis not present

## 2020-11-22 DIAGNOSIS — R2689 Other abnormalities of gait and mobility: Secondary | ICD-10-CM | POA: Diagnosis not present

## 2020-11-22 DIAGNOSIS — R278 Other lack of coordination: Secondary | ICD-10-CM | POA: Diagnosis not present

## 2020-11-22 DIAGNOSIS — G8191 Hemiplegia, unspecified affecting right dominant side: Secondary | ICD-10-CM | POA: Diagnosis not present

## 2020-11-29 ENCOUNTER — Ambulatory Visit: Payer: PPO | Admitting: Adult Health

## 2020-11-29 ENCOUNTER — Encounter: Payer: Self-pay | Admitting: Adult Health

## 2020-11-29 ENCOUNTER — Other Ambulatory Visit: Payer: Self-pay

## 2020-11-29 VITALS — BP 151/91 | HR 69 | Ht 76.0 in | Wt 191.0 lb

## 2020-11-29 DIAGNOSIS — I639 Cerebral infarction, unspecified: Secondary | ICD-10-CM

## 2020-11-29 MED ORDER — ASPIRIN EC 325 MG PO TBEC: 325.0000 mg | DELAYED_RELEASE_TABLET | Freq: Every day | ORAL | 0 refills | Status: AC

## 2020-11-29 NOTE — Progress Notes (Signed)
[facility] [address] [place]. [place] [address] [phone]       STROKE FOLLOW UP NOTE  [name] [facility] Date of Birth:  1945/10/07 Medical Record Number:  [id number]   Reason for Referral: stroke follow up    SUBJECTIVE:   CHIEF COMPLAINT:  Chief Complaint  Patient presents with  . Follow-up    TR with wife Jacob Short) Pt is having R arm immobility and balance is off     HPI:   Today, 11/29/2020, Jacob Short returns for stroke follow-up accompanied by his wife.  Reports residual right arm weakness and gait impairment with imbalance Consulted by [facility] for botox injections - f/u visit scheduled 3/11 to start botox injections He did TRANSPORT 2 trial at [facility] end of December with improvement of his hand function - he has f/u for study next month - he does report some regression since then especially with stiffness Still doing PT/OT thru [facility] 2x weekly - continues to do exercies daily He has been doing more ADLs independently  Use of cane when outdoors especially on uneven ground - does not use in his home Denies new stroke/TIA symptoms  Reports compliance on aspirin and Plavix but apparently taking aspirin 81 mg twice daily as advised by PCP per patient and wife report but unable to confirm this via epic Per home med list review, currently taking Crestor 10 mg daily although prescribed atorvastatin at hospital discharge and advised to discontinue Crestor and was taking atorvastatin at prior visit.  He is not sure if he is taking atorvastatin in addition to Crestor  Blood pressure today 151/91 -monitored at home and typically stable  No new concerns at this time    History provided for reference purposes only Initial visit 08/09/2020 JM: Jacob Short is being seen for hospital follow-up accompanied by his wife.  After 22 days, he was discharged from CIR to home on 07/11/2020.  Reports residual right arm weakness and gait  impairment.  Currently working with [facility] PT/OT twice weekly with ongoing improvement.  Currently ambulating with rolling walker but has been using a cane during therapy sessions.  His wife has been assisting with ADLs due to left arm weakness.  Denies new or worsening stroke/TIA symptoms.  Completed 30 days of aspirin and Brilinta combination and has transition back to aspirin 325 mg daily and Plavix 75 mg daily without bleeding or bruising.  Remains on atorvastatin 80 mg daily without myalgias.  Blood pressure today satisfactory 130/75.  He reports complete tobacco cessation since hospital discharge.  No further concerns at this time.  Stroke admission 06/15/2020 [name] is a 75 y.o. male with history of diabetes, hypertension, peripheral vascular disease, abdominal aortic aneurysm who presented on 06/15/2020 with R arm weakness and neglect.  Personally reviewed hospitalization pertinent progress notes, lab work and imaging with summary provided.  Evaluated by Dr. Erlinda Hong with stroke work-up revealing left corona radiata infarct secondary to small vessel disease source.  On aspirin 325 mg daily and Plavix 75 mg daily PTA therefore recommended 30 days of aspirin 81 mg daily and Brilinta 90 mg twice daily and then transition back to aspirin and Plavix. Hx of HTN stable during admission. Hx of HLD with LDL 38 and recommended continuation of Crestor 10 mg daily.  Controlled DM with A1c 6.9.  Current tobacco use with smoking cessation counseling provided.  Other stroke risk factors include advanced age, EtOH use, CAD s/p PCI, AAA repair 2018 and  PAD w/ stent in leg.  No prior stroke history.  Other active problems include iliac artery aneurysm.  Evaluated by therapies who recommended discharge to CIR for ongoing therapy needs and discharged to CIR on 06/19/2020.  Stroke:   L corona radiata infarct secondary to small vessel disease source  CT head No acute abnormality. Old R corona radiata lacune.  ASPECTS 10.     CTA head & neck no LVO. Moderate atherosclerosis   CT perfusion no core. L temporal lobe w/ 17mm likely artifactual  MRI 9/23  Acute L corona radiata infarct   MRI 9/24 no change  2D Echo EF 55-60%. No source of embolus   LDL 38  HgbA1c 6.9  VTE prophylaxis - Lovenox 40 mg sq daily   aspirin 325 mg daily and clopidogrel 75 mg daily prior to admission, now on aspirin 325 mg daily and clopidogrel 75 mg daily. Given failure of aspirin and plavix, recommend treatment with aspirin 81 and Brilinta 90 bid x 30 days then transition back to aspirin and plavix. insurance card availalbe via TOC $5 for 30d supply   Therapy recommendations:  CIR  Disposition:  CIR     ROS:   14 system review of systems performed and negative with exception of those listed in HPI  PMH:  Past Medical History:  Diagnosis Date  . Coronary artery disease   . Diabetes mellitus without complication ([place])   . Peripheral vascular disease (HCC)     PSH:  Past Surgical History:  Procedure Laterality Date  . CORONARY ANGIOPLASTY      Social History:  Social History   Socioeconomic History  . Marital status: Married    Spouse name: Not on file  . Number of children: Not on file  . Years of education: Not on file  . Highest education level: Not on file  Occupational History  . Not on file  Tobacco Use  . Smoking status: Former Smoker    Quit date: 06/15/2020    Years since quitting: 0.4  . Smokeless tobacco: Current User  Vaping Use  . Vaping Use: Never used  Substance and Sexual Activity  . Alcohol use: Not Currently  . Drug use: Not Currently  . Sexual activity: Not on file  Other Topics Concern  . Not on file  Social History Narrative  . Not on file   Social Determinants of Health   Financial Resource Strain: Not on file  Food Insecurity: Not on file  Transportation Needs: Not on file  Physical Activity: Not on file  Stress: Not on file  Social Connections: Not on  file  Intimate Partner Violence: Not on file    Family History: History reviewed. No pertinent family history.  Medications:   Current Outpatient Medications on File Prior to Visit  Medication Sig Dispense Refill  . acetaminophen (TYLENOL) 325 MG tablet Take 2 tablets (650 mg total) by mouth every 6 (six) hours as needed for mild pain (or Fever >/= 101).    Marland Kitchen aspirin EC 325 MG tablet Take 1 tablet (325 mg total) by mouth daily. (Patient taking differently: Take 325 mg by mouth 2 (two) times daily.) 30 tablet 0  . atorvastatin (LIPITOR) 80 MG tablet Take 1 tablet (80 mg total) by mouth daily. 30 tablet 0  . carvedilol (COREG) 12.5 MG tablet Take 12.5 mg by mouth 2 (two) times daily.    . clopidogrel (PLAVIX) 75 MG tablet Take 1 tablet (75 mg total) by mouth daily. 30 tablet  0  . glipiZIDE (GLUCOTROL) 5 MG tablet Take 0.5 tablets (2.5 mg total) by mouth daily before breakfast. 30 tablet 0  . hydrALAZINE (APRESOLINE) 10 MG tablet Take 1 tablet (10 mg total) by mouth every 8 (eight) hours. 90 tablet 0  . losartan (COZAAR) 25 MG tablet Take 25 mg by mouth daily.    . metFORMIN (GLUCOPHAGE) 500 MG tablet Take 2-3 tablets (1,000-1,500 mg total) by mouth See admin instructions. Take 1000mg  in the morning and 1500mg  in the evening. 180 tablet 0  . methocarbamol (ROBAXIN) 500 MG tablet Take 1 tablet (500 mg total) by mouth 2 (two) times daily as needed for muscle spasms. 60 tablet 0   No current facility-administered medications on file prior to visit.    Allergies:  No Known Allergies    OBJECTIVE:  Physical Exam  Vitals:   11/29/20 0836  BP: (!) 151/91  Pulse: 69  Weight: 191 lb (86.6 kg)  Height: 6\' 4"  (1.93 m)   Body mass index is 23.25 kg/m. No exam data present  General: well developed, well nourished,  very pleasant elderly Caucasian male, seated, in no evident distress Head: head normocephalic and atraumatic.   Neck: supple with no carotid or supraclavicular  bruits Cardiovascular: regular rate and rhythm, no murmurs Musculoskeletal: no deformity Skin:  no rash/petichiae Vascular:  Normal pulses all extremities   Neurologic Exam Mental Status: Awake and fully alert.   Fluent speech and language.  Oriented to place and time. Recent and remote memory intact. Attention span, concentration and fund of knowledge appropriate. Mood and affect appropriate.  Cranial Nerves: Pupils equal, briskly reactive to light. Extraocular movements full without nystagmus. Visual fields full to confrontation. Hearing intact. Facial sensation intact.  Very slight left lower facial weakness.  Tongue, palate moves normally and symmetrically.  Motor: Normal bulk and tone and strength RUE and RLE.  LUE: 4/5 proximal and 2-3/5 hand with increased tone and mild/minimal swelling.  LLE: 5/5 Sensory.: intact to touch , pinprick , position and vibratory sensation.  Coordination: Rapid alternating movements normal in all extremities except left hand. Finger-to-nose performed accurately RUE and heel-to-shin performed accurately bilaterally. Gait and Station: Arises from chair without difficulty. Stance is normal. Gait demonstrates  normal stride length with decreased step height and use of cane.  Tandem walk and heel toe not attempted Reflexes: 1+ and symmetric. Toes downgoing.        ASSESSMENT: Jacob Short is a 75 y.o. year old male presented with right arm weakness and neglect on 06/15/2020 with stroke work-up revealed left corona radiata infarct secondary to small vessel disease. Vascular risk factors include HTN, HLD, DM, tobacco use, EtOH use, CAD s/p PCI, AAA s/p repair 2018 and PAD w/ stent in leg.      PLAN:  1. L CR stroke :  a. Residual deficit: LUE weakness and gait impairment.   i. Continue working with outpatient PT/OT f/u with [facility] with plans on starting Botox.   ii. Completed TRANSPORT 2 trial at [facility] with reported improvement b. take  aspirin 325 mg daily and clopidogrel 75 mg daily -he was advised to schedule follow-up with cardiology and vascular surgery for further recommendations of aspirin dosage but as he was on full dose aspirin prior to his stroke, he was advised to return to that dosage unless otherwise instructed by cardiology or vascular surgery.  Remains on Crestor 10 mg daily and advised to ensure he is not also taking atorvastatin in addition.  c. Discussed secondary stroke prevention measures and importance of close PCP follow up for aggressive stroke risk factor management  2. HTN: BP goal <130/90.  Stable at home per patient on current regimen per PCP 3. HLD: LDL goal <70.  Prior LDL 38.  On Crestor 10 mg daily per PCP - advised to ensure he is only on Crestor and not both atorvastatin and crestor together.  Request routine follow-up with PCP for routine monitoring of lipid panel and ongoing prescribing of statin 4. DMII: A1c goal<7.0.  Monitor by PCP    Follow up in 6 months or call earlier if needed   CC:  [facility] provider: Dr. Luberta Mutter, MD    I spent 40 minutes of face-to-face and non-face-to-face time with patient and wife.  This included previsit chart review, lab review, study review, order entry, electronic health record documentation, patient education regarding prior stroke, residual deficits, participation with [facility] trial, Botox and ongoing therapy, importance of managing stroke risk factors and answered all other questions to patient and wife's satisfaction  Frann Rider, AGNP-BC  [facility] [address] [place], [place] [address]  Phone [phone] Fax [phone] Note: This document was prepared with digital dictation and possible smart phrase technology. Any transcriptional errors that result from this process are unintentional.

## 2020-11-29 NOTE — Patient Instructions (Addendum)
Continue working with physical and Occupational Therapy and use of cane at all times for fall prevention  Continue to follow with [facility] for Botox injections  Consider shoulder injections with Dr. Posey Pronto for shoulder stiffness  Continue aspirin 325 mg daily and clopidogrel 75 mg daily  and Crestor 10mg  daily for secondary stroke prevention Please ensure that you are not taking atorvastatin (Lipitor) in addition to Crestor Please take full dose aspirin 325mg  daily until you speak further with your cardiologist   Continue to follow up with PCP regarding cholesterol, blood pressure and diabetes management  Maintain strict control of hypertension with blood pressure goal below 130/90, diabetes with hemoglobin A1c goal below 7% and cholesterol with LDL cholesterol (bad cholesterol) goal below 70 mg/dL.      Followup in the future with me in 6 months or call earlier if needed      Thank you for coming to see [place] at [facility]. I hope we have been able to provide you high quality care today.  You may receive a patient satisfaction survey over the next few weeks. We would appreciate your feedback and comments so that we may continue to improve ourselves and the health of our patients.

## 2020-11-30 ENCOUNTER — Other Ambulatory Visit: Payer: Self-pay

## 2020-11-30 ENCOUNTER — Encounter: Payer: Self-pay | Admitting: Physical Medicine & Rehabilitation

## 2020-11-30 ENCOUNTER — Encounter: Payer: PPO | Attending: Registered Nurse | Admitting: Physical Medicine & Rehabilitation

## 2020-11-30 VITALS — BP 160/88 | HR 72 | Temp 98.7°F | Ht 76.0 in | Wt 190.4 lb

## 2020-11-30 DIAGNOSIS — R269 Unspecified abnormalities of gait and mobility: Secondary | ICD-10-CM | POA: Diagnosis not present

## 2020-11-30 DIAGNOSIS — G8191 Hemiplegia, unspecified affecting right dominant side: Secondary | ICD-10-CM | POA: Diagnosis not present

## 2020-11-30 DIAGNOSIS — M7501 Adhesive capsulitis of right shoulder: Secondary | ICD-10-CM | POA: Diagnosis not present

## 2020-11-30 DIAGNOSIS — M62838 Other muscle spasm: Secondary | ICD-10-CM | POA: Diagnosis not present

## 2020-11-30 DIAGNOSIS — I679 Cerebrovascular disease, unspecified: Secondary | ICD-10-CM | POA: Diagnosis not present

## 2020-11-30 DIAGNOSIS — G8110 Spastic hemiplegia affecting unspecified side: Secondary | ICD-10-CM | POA: Insufficient documentation

## 2020-11-30 MED ORDER — BACLOFEN 10 MG PO TABS: 10.0000 mg | ORAL_TABLET | Freq: Three times a day (TID) | ORAL | 1 refills | Status: DC

## 2020-11-30 NOTE — Progress Notes (Signed)
Subjective:    Patient ID: Jacob Short, male    DOB: [birth year], 75 y.o.   MRN: [id number]  HPI Right-handed male with history of CAD and angioplasty, diabetes mellitus, tobacco abuse, AAA, hyperlipidemia presents for follow up for left corona radiata infarct secondary small vessel disease   Last clinic on visit on 08/22/20.  Wife supplements history. He was enrolled in a blinded ?transcranial stimulation yesterday. Patient is planned to have Botulinum toxin tomorrow. He is still in therapies.  He is following up with Neuro. He is no longer wearing his WHO.  Muscle spasms are intermittent.  Denies falls.  Pain Inventory Average Pain 4 Pain Right Now 4 My pain is aching and no pain  LOCATION OF PAIN  Shoulder, elbow,wrist BOWEL Number of stools per week: 4 Oral laxative use No  Type of laxative n/a Enema or suppository use No  History of colostomy No  Incontinent No   BLADDER Normal In and out cath, frequency n/a Able to self cath n/a Bladder incontinence No  Frequent urination No  Leakage with coughing No  Difficulty starting stream No  Incomplete bladder emptying No    Mobility walk with assistance use a cane ability to climb steps?  yes do you drive?  no  Function retired  Neuro/Psych trouble walking spasms, numbness  Prior Studies Any changes since last visit?  no  Physicians involved in your care Any changes since last visit?  no   History reviewed. No pertinent family history. Social History   Socioeconomic History  . Marital status: Married    Spouse name: Not on file  . Number of children: Not on file  . Years of education: Not on file  . Highest education level: Not on file  Occupational History  . Not on file  Tobacco Use  . Smoking status: Former Smoker    Quit date: 06/15/2020    Years since quitting: 0.4  . Smokeless tobacco: Current User  Vaping Use  . Vaping Use: Never used  Substance and Sexual Activity  . Alcohol use: Not  Currently  . Drug use: Not Currently  . Sexual activity: Not on file  Other Topics Concern  . Not on file  Social History Narrative  . Not on file   Social Determinants of Health   Financial Resource Strain: Not on file  Food Insecurity: Not on file  Transportation Needs: Not on file  Physical Activity: Not on file  Stress: Not on file  Social Connections: Not on file   Past Surgical History:  Procedure Laterality Date  . CORONARY ANGIOPLASTY     Past Medical History:  Diagnosis Date  . Coronary artery disease   . Diabetes mellitus without complication ([place])   . Peripheral vascular disease (HCC)    BP (!) 160/88   Pulse 72   Temp 98.7 F (37.1 C)   Ht 6\' 4"  (1.93 m)   Wt 190 lb 6.4 oz (86.4 kg)   SpO2 96%   BMI 23.18 kg/m   Opioid Risk Score:   Fall Risk Score:  `1  Depression screen PHQ 2/9  Depression screen [facility] 2/9 11/30/2020 08/09/2020 07/18/2020  Decreased Interest 0 0 0  Down, Depressed, Hopeless 0 0 0  PHQ - 2 Score 0 0 0    Review of Systems  Constitutional: Negative.   HENT: Negative.   Eyes: Negative.   Respiratory: Negative.   Cardiovascular: Negative.   Gastrointestinal: Negative.   Endocrine: Negative.   Musculoskeletal: Positive  for arthralgias, gait problem and myalgias.       Hand swelling  Spasms   Skin: Negative.   Neurological: Positive for weakness.  Psychiatric/Behavioral: Negative.   All other systems reviewed and are negative.    Objective:   Physical Exam  Constitutional: No distress . Vital signs reviewed. HENT: Normocephalic.  Atraumatic. Eyes: EOMI. No discharge. Cardiovascular: No JVD.   Respiratory: Normal effort.  No stridor.   GI: Non-distended.   Skin: Warm and dry.  Intact. Psych: Normal mood.  Normal behavior. Musc: Right hand edema, some improvement. No tenderness in extremities. Gait: Mild/Mod Step to gait, some improvement Limited ER in right shoulder Neuro: Alert Right facial droop, unchanged Motor:   RUE shoulder abduction 4/5, elbow flexion/extension 4/5, wrist extension 3-/5, handgrip 3-/5 RLE: 4+-5/5 proximal to distal Mas: Shoulder abduction 1/4, elbow flexors 1/4, wrist extensors 1/4    Assessment & Plan:  Right-handed male with history of CAD and angioplasty, diabetes mellitus, tobacco abuse, AAA, hyperlipidemia presents for follow up for left corona radiata infarct secondary small vessel disease  1. Right side hemiparesis, now with spasticity secondary to left corona radiata infarct secondary small vessel disease  Continue therapies  Continue follow up Neurology  Encouraged WHO again  Patient is scheduled to have Botox at [facility]  Will trial Baclofen 10 TID  2. Muscle spasms:              Continue Robaxin PRN  3. Gait abnormality  Continue therapies  Continue cane for safety  4. Right hand edema  Likely secondary to stroke, however, given weakness and increasing edema ordered doppler, unremarkable per patient (at OSH)  Encouraged bracing, glove  Encouraged elevation  5. Adhesive capsulitis

## 2020-12-01 DIAGNOSIS — I69351 Hemiplegia and hemiparesis following cerebral infarction affecting right dominant side: Secondary | ICD-10-CM | POA: Diagnosis not present

## 2020-12-10 ENCOUNTER — Other Ambulatory Visit: Payer: Self-pay | Admitting: Physical Medicine & Rehabilitation

## 2020-12-10 NOTE — Progress Notes (Signed)
I agree with the above plan 

## 2020-12-22 NOTE — Telephone Encounter (Signed)
Encounter opened in error

## 2020-12-25 DIAGNOSIS — M6281 Muscle weakness (generalized): Secondary | ICD-10-CM | POA: Diagnosis not present

## 2020-12-25 DIAGNOSIS — R278 Other lack of coordination: Secondary | ICD-10-CM | POA: Diagnosis not present

## 2020-12-25 DIAGNOSIS — G8191 Hemiplegia, unspecified affecting right dominant side: Secondary | ICD-10-CM | POA: Diagnosis not present

## 2020-12-25 DIAGNOSIS — R2681 Unsteadiness on feet: Secondary | ICD-10-CM | POA: Diagnosis not present

## 2020-12-25 DIAGNOSIS — R2689 Other abnormalities of gait and mobility: Secondary | ICD-10-CM | POA: Diagnosis not present

## 2021-01-18 ENCOUNTER — Encounter: Payer: PPO | Attending: Registered Nurse | Admitting: Physical Medicine & Rehabilitation

## 2021-01-18 DIAGNOSIS — G8110 Spastic hemiplegia affecting unspecified side: Secondary | ICD-10-CM | POA: Insufficient documentation

## 2021-01-18 DIAGNOSIS — R269 Unspecified abnormalities of gait and mobility: Secondary | ICD-10-CM | POA: Insufficient documentation

## 2021-01-18 DIAGNOSIS — I679 Cerebrovascular disease, unspecified: Secondary | ICD-10-CM | POA: Insufficient documentation

## 2021-01-18 DIAGNOSIS — M7501 Adhesive capsulitis of right shoulder: Secondary | ICD-10-CM | POA: Insufficient documentation

## 2021-01-18 DIAGNOSIS — G8191 Hemiplegia, unspecified affecting right dominant side: Secondary | ICD-10-CM | POA: Insufficient documentation

## 2021-01-18 DIAGNOSIS — M62838 Other muscle spasm: Secondary | ICD-10-CM | POA: Insufficient documentation

## 2021-01-24 DIAGNOSIS — M6281 Muscle weakness (generalized): Secondary | ICD-10-CM | POA: Diagnosis not present

## 2021-01-24 DIAGNOSIS — R2681 Unsteadiness on feet: Secondary | ICD-10-CM | POA: Diagnosis not present

## 2021-01-24 DIAGNOSIS — R2689 Other abnormalities of gait and mobility: Secondary | ICD-10-CM | POA: Diagnosis not present

## 2021-01-24 DIAGNOSIS — I679 Cerebrovascular disease, unspecified: Secondary | ICD-10-CM | POA: Diagnosis not present

## 2021-01-24 DIAGNOSIS — R278 Other lack of coordination: Secondary | ICD-10-CM | POA: Diagnosis not present

## 2021-01-24 DIAGNOSIS — G8191 Hemiplegia, unspecified affecting right dominant side: Secondary | ICD-10-CM | POA: Diagnosis not present

## 2021-01-29 DIAGNOSIS — I679 Cerebrovascular disease, unspecified: Secondary | ICD-10-CM | POA: Diagnosis not present

## 2021-01-29 DIAGNOSIS — I10 Essential (primary) hypertension: Secondary | ICD-10-CM | POA: Diagnosis not present

## 2021-01-29 DIAGNOSIS — E1165 Type 2 diabetes mellitus with hyperglycemia: Secondary | ICD-10-CM | POA: Diagnosis not present

## 2021-01-30 ENCOUNTER — Encounter: Payer: PPO | Attending: Registered Nurse | Admitting: Physical Medicine & Rehabilitation

## 2021-01-30 DIAGNOSIS — M7501 Adhesive capsulitis of right shoulder: Secondary | ICD-10-CM | POA: Insufficient documentation

## 2021-01-30 DIAGNOSIS — I679 Cerebrovascular disease, unspecified: Secondary | ICD-10-CM | POA: Insufficient documentation

## 2021-01-30 DIAGNOSIS — R269 Unspecified abnormalities of gait and mobility: Secondary | ICD-10-CM | POA: Insufficient documentation

## 2021-01-30 DIAGNOSIS — G8191 Hemiplegia, unspecified affecting right dominant side: Secondary | ICD-10-CM | POA: Insufficient documentation

## 2021-02-06 DIAGNOSIS — I10 Essential (primary) hypertension: Secondary | ICD-10-CM | POA: Diagnosis not present

## 2021-02-06 DIAGNOSIS — E1165 Type 2 diabetes mellitus with hyperglycemia: Secondary | ICD-10-CM | POA: Diagnosis not present

## 2021-02-15 ENCOUNTER — Encounter: Payer: PPO | Admitting: Physical Medicine & Rehabilitation

## 2021-02-15 ENCOUNTER — Other Ambulatory Visit: Payer: Self-pay

## 2021-02-15 ENCOUNTER — Encounter: Payer: Self-pay | Admitting: Physical Medicine & Rehabilitation

## 2021-02-15 VITALS — BP 136/78 | HR 76 | Temp 98.6°F | Ht 76.0 in | Wt 193.0 lb

## 2021-02-15 DIAGNOSIS — G8191 Hemiplegia, unspecified affecting right dominant side: Secondary | ICD-10-CM

## 2021-02-15 DIAGNOSIS — R269 Unspecified abnormalities of gait and mobility: Secondary | ICD-10-CM | POA: Diagnosis not present

## 2021-02-15 DIAGNOSIS — I679 Cerebrovascular disease, unspecified: Secondary | ICD-10-CM | POA: Diagnosis not present

## 2021-02-15 DIAGNOSIS — M7501 Adhesive capsulitis of right shoulder: Secondary | ICD-10-CM

## 2021-02-15 NOTE — Progress Notes (Addendum)
Subjective:    Patient ID: Jacob Short, male    DOB: [birth year], 75 y.o.   MRN: [id number]  HPI Right-handed male with history of CAD and angioplasty, diabetes mellitus, tobacco abuse, AAA, hyperlipidemia presents for follow up for left corona radiata infarct secondary small vessel disease   Last clinic on visit on 11/30/20.  Since that time, pt states he completed PT, but is still in OT. He continues to follow up with Neuro. He states he not longer needs his WHO.  Pt notes improvement with Botulinum toxin at [facility]. He is not taking Baclofen. CBGs have been labile per patient. Denies falls.   Pain Inventory Average Pain 0 Pain Right Now 0 My pain is aching and no pain  LOCATION OF PAIN  Shoulder, elbow,wrist BOWEL Number of stools per week: 4 Oral laxative use No  Type of laxative n/a Enema or suppository use No  History of colostomy No  Incontinent No   BLADDER Normal In and out cath, frequency n/a Able to self cath n/a Bladder incontinence No  Frequent urination No  Leakage with coughing No  Difficulty starting stream No  Incomplete bladder emptying No    Mobility walk with assistance use a cane how many minutes can you walk? 45 ability to climb steps?  yes do you drive?  no  Function retired  Neuro/Psych trouble walking spasms, numbness  Prior Studies Any changes since last visit?  no  Physicians involved in your care Any changes since last visit?  no   History reviewed. No pertinent family history. Social History   Socioeconomic History  . Marital status: Married    Spouse name: Not on file  . Number of children: Not on file  . Years of education: Not on file  . Highest education level: Not on file  Occupational History  . Not on file  Tobacco Use  . Smoking status: Former Smoker    Quit date: 06/15/2020    Years since quitting: 0.6  . Smokeless tobacco: Current User  Vaping Use  . Vaping Use: Never used  Substance and Sexual Activity   . Alcohol use: Not Currently  . Drug use: Not Currently  . Sexual activity: Not on file  Other Topics Concern  . Not on file  Social History Narrative  . Not on file   Social Determinants of Health   Financial Resource Strain: Not on file  Food Insecurity: Not on file  Transportation Needs: Not on file  Physical Activity: Not on file  Stress: Not on file  Social Connections: Not on file   Past Surgical History:  Procedure Laterality Date  . CORONARY ANGIOPLASTY     Past Medical History:  Diagnosis Date  . Coronary artery disease   . Diabetes mellitus without complication ([place])   . Peripheral vascular disease (HCC)    BP 136/78   Pulse 76   Temp 98.6 F (37 C)   Ht 6\' 4"  (1.93 m)   Wt 193 lb (87.5 kg)   SpO2 97%   BMI 23.49 kg/m   Opioid Risk Score:   Fall Risk Score:  `1  Depression screen PHQ 2/9  Depression screen [facility] 2/9 11/30/2020 08/09/2020 07/18/2020  Decreased Interest 0 0 0  Down, Depressed, Hopeless 0 0 0  PHQ - 2 Score 0 0 0    Review of Systems  Constitutional: Negative.   HENT: Negative.   Eyes: Negative.   Respiratory: Negative.   Cardiovascular: Negative.   Gastrointestinal: Negative.  Endocrine: Negative.   Musculoskeletal: Positive for arthralgias, gait problem and myalgias.       Hand swelling  Spasms   Skin: Negative.   Neurological: Positive for weakness.  Psychiatric/Behavioral: Negative.   All other systems reviewed and are negative.    Objective:   Physical Exam  Constitutional: No distress . Vital signs reviewed. HENT: Normocephalic.  Atraumatic. Eyes: EOMI. No discharge. Cardiovascular: No JVD.   Respiratory: Normal effort.  No stridor.   GI: Non-distended.   Skin: Warm and dry.  Intact. Psych: Normal mood.  Normal behavior. Musc: No edema or tenderness in extremities Gait: Mild/Mod Step to gait, some improvement Limited ER in right shoulder, stable Gait: Step-to gait Neuro: Alert Right facial droop,  unchanged Motor:  RUE shoulder abduction 4/5, elbow flexion/extension 4/5, wrist extension 3-/5, handgrip 3-/5 RLE: 4+-5/5 proximal to distal Mas: Shoulder abduction 1+/4, elbow flexors 1/4, wrist extensors 1+/4    Assessment & Plan:  Right-handed male with history of CAD and angioplasty, diabetes mellitus, tobacco abuse, AAA, hyperlipidemia presents for follow up for left corona radiata infarct secondary small vessel disease  1. Right side hemiparesis, now with spasticity secondary to left corona radiata infarct secondary small vessel disease  Continue therapies  Continue follow up Neurology  Encouraged WHO again x2  Patient is scheduled to have Botox at [facility]  Baclofen d/ced by PCP  2. Muscle spasms:              Continue Robaxin PRN  3. Gait abnormality  Continue therapies  Continue cane for safety  4. Right hand edema  Likely secondary to stroke, however, given weakness and increasing edema ordered doppler, unremarkable per patient (at OSH)  Encouraged bracing, glove  Encouraged elevation  Improving  5. Adhesive capsulitis  Patient missed joint injection, will schedule with Dr. Bradd Burner

## 2021-03-08 ENCOUNTER — Telehealth: Payer: Self-pay | Admitting: *Deleted

## 2021-03-08 NOTE — Telephone Encounter (Signed)
Mr [facility] called for a refill on his robaxin.  Dr Serita Grit note says to continue the robaxin.  He is to see Dr Letta Pate on 03/29/21 but he is out of the office today and tomorrow.  Is it ok to refill this medication?

## 2021-03-09 MED ORDER — METHOCARBAMOL 500 MG PO TABS
ORAL_TABLET | ORAL | 2 refills | Status: DC
Start: 2021-03-09 — End: 2022-09-03

## 2021-03-09 NOTE — Telephone Encounter (Signed)
Ok'd to refill per DR Ranell Patrick. Mrs [facility] notified.

## 2021-03-23 ENCOUNTER — Ambulatory Visit: Payer: PPO | Admitting: Physical Medicine & Rehabilitation

## 2021-03-29 ENCOUNTER — Encounter: Payer: HMO | Attending: Registered Nurse | Admitting: Physical Medicine & Rehabilitation

## 2021-03-29 ENCOUNTER — Other Ambulatory Visit: Payer: Self-pay

## 2021-03-29 ENCOUNTER — Encounter: Payer: Self-pay | Admitting: Physical Medicine & Rehabilitation

## 2021-03-29 VITALS — BP 125/78 | HR 82 | Temp 98.5°F | Ht 76.0 in | Wt 188.6 lb

## 2021-03-29 DIAGNOSIS — M7501 Adhesive capsulitis of right shoulder: Secondary | ICD-10-CM | POA: Insufficient documentation

## 2021-03-29 NOTE — Progress Notes (Signed)
Shoulder injection right glenohumeral Without ultrasound guidance)  Indication: Right shoulder pain not relieved by medication management and other conservative care.  Informed consent was obtained after describing risks and benefits of the procedure with the patient, this includes bleeding, bruising, infection and medication side effects. The patient wishes to proceed and has given written consent. Patient was placed in a seated position. The right shoulder was marked and prepped with betadine in the subacromial area. A 25-gauge 1-1/2 inch needle was inserted into the subacromial area. After negative draw back for blood, a solution containing 1 mL of 6 mg per ML betamethasone and 4 mL of 1% lidocaine was injected. A band aid was applied. The patient tolerated the procedure well. Post procedure instructions were given.

## 2021-03-29 NOTE — Patient Instructions (Signed)
Adhesive Capsulitis  Adhesive capsulitis, also called frozen shoulder, causes the shoulder to become stiff and painful to move. This condition happens when there is inflammation of the tendons and ligaments that surround the shoulder joint (shoulder capsule). What are the causes? This condition may be caused by: An injury to your shoulder joint. Straining your shoulder. Not moving your shoulder for a period of time. This can happen if your arm was injured or in a sling. Long-standing conditions, such as: Diabetes. Thyroid problems. Heart disease. Stroke. Rheumatoid arthritis. Lung disease. In some cases, the cause is not known. What increases the risk? You are more likely to develop this condition if you are: A woman. Older than 76 years of age. What are the signs or symptoms? Symptoms of this condition include: Pain in your shoulder when you move your arm. There may also be pain when parts of your shoulder are touched. The pain may be worse at night or when you are resting. A sore or aching shoulder. The inability to move your shoulder normally. Muscle spasms. How is this diagnosed? This condition is diagnosed with a physical exam and imaging tests, such as anX-ray or MRI. How is this treated? This condition may be treated with: Treatment of the underlying cause or condition. Medicine. Medicine may be given to relieve pain, inflammation, or muscle spasms. Steroid injections into the shoulder joint. Physical therapy. This involves performing exercises to get the shoulder moving again. Acupuncture. This is a type of treatment that involves stimulating specific points on your body by inserting thin needles through your skin. Shoulder manipulation. This is a procedure to move the shoulder into another position. It is done after you are given a medicine to make you fall asleep (general anesthetic). The joint may also be injected with salt water at high pressure to break down  scarring. Surgery. This may be done in severe cases when other treatments have failed. Although most people recover completely from adhesive capsulitis, some may notregain full shoulder movement. Follow these instructions at home: Managing pain, stiffness, and swelling     If directed, put ice on the injured area: Put ice in a plastic bag. Place a towel between your skin and the bag. Leave the ice on for 20 minutes, 2-3 times per day. If directed, apply heat to the affected area before you exercise. Use the heat source that your health care provider recommends, such as a moist heat pack or a heating pad. Place a towel between your skin and the heat source. Leave the heat on for 20-30 minutes. Remove the heat if your skin turns bright red. This is especially important if you are unable to feel pain, heat, or cold. You may have a greater risk of getting burned. General instructions Take over-the-counter and prescription medicines only as told by your health care provider. If you are being treated with physical therapy, follow instructions from your physical therapist. Avoid exercises that put a lot of demand on your shoulder, such as throwing. These exercises can make pain worse. Keep all follow-up visits as told by your health care provider. This is important. Contact a health care provider if: You develop new symptoms. Your symptoms get worse. Summary Adhesive capsulitis, also called frozen shoulder, causes the shoulder to become stiff and painful to move. You are more likely to have this condition if you are a woman and over age 76. It is treated with physical therapy, medicines, and sometimes surgery. This information is not intended to replace advice  given to you by your health care provider. Make sure you discuss any questions you have with your healthcare provider. Document Revised: 02/13/2018 Document Reviewed: 02/13/2018 Elsevier Patient Education  [place].

## 2021-04-09 ENCOUNTER — Telehealth (HOSPITAL_COMMUNITY): Payer: Self-pay | Admitting: Specialist

## 2021-04-09 ENCOUNTER — Telehealth: Payer: Self-pay | Admitting: Physical Medicine & Rehabilitation

## 2021-04-09 NOTE — Telephone Encounter (Signed)
April M gave me message patient is upset about no show charges - she told him she reviewed phone records and check out - multiple ways to show he was aware of appt.  He was extremely loud and rude and she told him she would not continue call. [id number]  Mr Jacob Short called [phone] upset about no show charges - mid morning I had not had chance to call him back due to covering front desk.  I explained multiple checks in place - at check out he booked both appts.  Patient accounting has already requested phone tree records that messages went to his answering machine.  These no show charges cannot be written off--hospital charges for missed appointment - he signs new patient financial documents that he is aware that will occur.  Patient became belligerent and shouted multiple profanities that he did not make these appointments and demanded to know who would write off his charges. He told me to cancel appt then told me not to cancel future appt. I told him I would contact my Mud[employer] .  I forwarded call to [facility]- interim.

## 2021-04-09 NOTE — Telephone Encounter (Signed)
On 04/09/21 Hazeline Junker, Interim Director for PMR contact patient at number listed in [place] and spoke with patient's wife.  Beth returning patients call as requested as he was upset regarding no show fees he has incurred for visits on 01/18/21 and 01/26/21.  Beth spoke with patient's wife and explained due the aggressive behavior patient used on 2 separate phone conversations with staff, he is being dismissed from our practice.  Patient's wife stated she understood and would seek treatment elsewhere.  I educated her that if she needed records released, she could contact us to do so.  I did agree to waive the no show fees for the dates, 01/18/21 and 01/26/21. I asked if I should call patient seperately to relay this information and patient's wife stated no, she would relay the information. Vangie Bicker, [place], OTR/L [phone]

## 2021-05-15 ENCOUNTER — Ambulatory Visit: Payer: HMO | Admitting: Physical Medicine & Rehabilitation

## 2021-06-05 ENCOUNTER — Ambulatory Visit: Payer: HMO | Admitting: Adult Health

## 2021-06-05 ENCOUNTER — Encounter: Payer: Self-pay | Admitting: Adult Health

## 2021-06-05 NOTE — Progress Notes (Deleted)
[facility] [address] [place]. [place] [address] [phone]       STROKE FOLLOW UP NOTE  [name] Date of Birth:  03-14-1946 Medical Record Number:  [id number]   Reason for Referral: stroke follow up    SUBJECTIVE:   CHIEF COMPLAINT:  No chief complaint on file.   HPI:   Today, 06/05/2021, [name] returns for 66-monthstroke follow-up accompanied by his wife.  Overall doing well.  Continues to follow with [facility] neurology receiving Botox with improvement RUE spasticity.  Also recently received injection for right shoulder adhesive capsulitis by PMR.  Denies new stroke/TIA symptoms.  Compliant on aspirin and Plavix as well as Crestor.  Blood pressure today ***.       History provided for reference purposes Update 12/12/2020 JM: [name] for stroke follow-up accompanied by his wife.  Reports residual right arm weakness and gait impairment with imbalance [facility] - f/u visit scheduled 3/11 to start botox injections [facility] of [facility] hand function - he has f/u for study next month - he does report some regression since then especially with stiffness [facility] - continues to do exercies daily He has been doing more ADLs independently  Use of cane when outdoors especially on uneven ground - does not use in his home Denies new stroke/TIA symptoms  Reports compliance on aspirin and Plavix but apparently taking aspirin 81 mg twice daily as advised by PCP per patient and wife report but unable to confirm this via epic Per home med list review, currently taking Crestor 10 mg daily although prescribed atorvastatin at hospital discharge and advised to discontinue Crestor and was taking atorvastatin at prior visit.  He is not sure if he is taking atorvastatin in addition to Crestor  Blood pressure today  151/91 -monitored at home and typically stable  No new concerns at this time  Initial visit 08/09/2020 JM: [name] being seen for hospital follow-up accompanied by his wife.  After 22 days, he was discharged from CIR to home on 07/11/2020.  Reports residual right arm weakness and gait impairment.  [facility] with ongoing improvement.  Currently ambulating with rolling walker but has been using a cane during therapy sessions.  His wife has been assisting with ADLs due to left arm weakness.  Denies new or worsening stroke/TIA symptoms.  Completed 30 days of aspirin and Brilinta combination and has transition back to aspirin 325 mg daily and Plavix 75 mg daily without bleeding or bruising.  Remains on atorvastatin 80 mg daily without myalgias.  Blood pressure today satisfactory 130/75.  He reports complete tobacco cessation since hospital discharge.  No further concerns at this time.  Stroke admission 06/15/2020 Mr. Jacob Lynskeyis a 75y.o. male with history of diabetes, hypertension, peripheral vascular disease, abdominal aortic aneurysm who presented on 06/15/2020 with R arm weakness and neglect.  Personally reviewed hospitalization pertinent progress notes, lab work and imaging with summary provided.  Evaluated by Dr. XErlinda Hongwith stroke work-up revealing left corona radiata infarct secondary to small vessel disease source.  On aspirin 325 mg daily and Plavix 75 mg daily PTA therefore recommended 30 days of aspirin 81 mg daily and Brilinta 90 mg twice daily and then transition back to aspirin and Plavix. Hx of HTN stable during admission. Hx of HLD  with LDL 38 and recommended continuation of Crestor 10 mg daily.  Controlled DM with A1c 6.9.  Current tobacco use with smoking cessation counseling provided.  Other stroke risk factors include advanced age, EtOH use, CAD s/p PCI, AAA repair 2018 and PAD w/ stent in leg.  No prior stroke history.  Other active  problems include iliac artery aneurysm.  Evaluated by therapies who recommended discharge to CIR for ongoing therapy needs and discharged to CIR on 06/19/2020.  Stroke:   L corona radiata infarct secondary to small vessel disease source CT head No acute abnormality. Old R corona radiata lacune. ASPECTS 10.    CTA head & neck no LVO. Moderate atherosclerosis  CT perfusion no core. L temporal lobe w/ 54m likely artifactual MRI 9/23  Acute L corona radiata infarct  MRI 9/24 no change 2D Echo EF 55-60%. No source of embolus  LDL 38 HgbA1c 6.9 VTE prophylaxis - Lovenox 40 mg sq daily  aspirin 325 mg daily and clopidogrel 75 mg daily prior to admission, now on aspirin 325 mg daily and clopidogrel 75 mg daily. Given failure of aspirin and plavix, recommend treatment with aspirin 81 and Brilinta 90 bid x 30 days then transition back to aspirin and plavix. insurance card availalbe via TOC $5 for 30d supply  Therapy recommendations:  CIR Disposition:  CIR     ROS:   14 system review of systems performed and negative with exception of those listed in HPI  PMH:  Past Medical History:  Diagnosis Date   Coronary artery disease    Diabetes mellitus without complication (HDowns    Peripheral vascular disease (HCC)     PSH:  Past Surgical History:  Procedure Laterality Date   CORONARY ANGIOPLASTY      Social History:  Social History   Socioeconomic History   Marital status: Married    Spouse name: Not on file   Number of children: Not on file   Years of education: Not on file   Highest education level: Not on file  Occupational History   Not on file  Tobacco Use   Smoking status: Former    Types: Cigarettes    Quit date: 06/15/2020    Years since quitting: 0.9   Smokeless tobacco: Current  Vaping Use   Vaping Use: Never used  Substance and Sexual Activity   Alcohol use: Not Currently   Drug use: Not Currently   Sexual activity: Not on file  Other Topics Concern   Not on file   Social History Narrative   Not on file   Social Determinants of Health   Financial Resource Strain: Not on file  Food Insecurity: Not on file  Transportation Needs: Not on file  Physical Activity: Not on file  Stress: Not on file  Social Connections: Not on file  Intimate Partner Violence: Not on file    Family History: No family history on file.  Medications:   Current Outpatient Medications on File Prior to Visit  Medication Sig Dispense Refill   acetaminophen (TYLENOL) 325 MG tablet Take 2 tablets (650 mg total) by mouth every 6 (six) hours as needed for mild pain (or Fever >/= 101).     aspirin EC 325 MG tablet Take 1 tablet (325 mg total) by mouth daily. 30 tablet 0   Baclofen 5 MG TABS Take by mouth.     carvedilol (COREG) 12.5 MG tablet Take 12.5 mg by mouth 2 (two) times daily.     Cholecalciferol (VITAMIN D)  50 MCG (2000 UT) tablet Take 2,000 Units by mouth daily.     clopidogrel (PLAVIX) 75 MG tablet Take 1 tablet (75 mg total) by mouth daily. 30 tablet 0   Continuous Blood Gluc Receiver (FREESTYLE LIBRE 2 READER) DEVI USE AS DIRECTED TO MONITOR GLUCOSE CONTINOUSLY     dapagliflozin propanediol (FARXIGA) 10 MG TABS tablet Take 5 mg by mouth daily.     Dulaglutide (TRULICITY) A999333 0000000 SOPN Inject into the skin once a week.     losartan (COZAAR) 25 MG tablet Take 25 mg by mouth daily.     losartan (COZAAR) 50 MG tablet Take 50 mg by mouth daily.     metFORMIN (GLUCOPHAGE) 500 MG tablet Take 2-3 tablets (1,000-1,500 mg total) by mouth See admin instructions. Take '1000mg'$  in the morning and '1500mg'$  in the evening. (Patient taking differently: Take by mouth See admin instructions. Take '1000mg'$  in the morning and '1000mg'$  in the evening.) 180 tablet 0   methocarbamol (ROBAXIN) 500 MG tablet TAKE 1 TABLET(500 MG) BY MOUTH TWICE DAILY AS NEEDED FOR MUSCLE SPASMS 60 tablet 2   nitroGLYCERIN (NITROSTAT) 0.4 MG SL tablet Place under the tongue.     rosuvastatin (CRESTOR) 10 MG  tablet Take 10 mg by mouth daily.     No current facility-administered medications on file prior to visit.    Allergies:  No Known Allergies    OBJECTIVE:  Physical Exam  There were no vitals filed for this visit.  There is no height or weight on file to calculate BMI. No results found.  General: well developed, well nourished,  very pleasant elderly Caucasian male, seated, in no evident distress Head: head normocephalic and atraumatic.   Neck: supple with no carotid or supraclavicular bruits Cardiovascular: regular rate and rhythm, no murmurs Musculoskeletal: no deformity Skin:  no rash/petichiae Vascular:  Normal pulses all extremities   Neurologic Exam Mental Status: Awake and fully alert.   Fluent speech and language.  Oriented to place and time. Recent and remote memory intact. Attention span, concentration and fund of knowledge appropriate. Mood and affect appropriate.  Cranial Nerves: Pupils equal, briskly reactive to light. Extraocular movements full without nystagmus. Visual fields full to confrontation. Hearing intact. Facial sensation intact.  Very slight left lower facial weakness.  Tongue, palate moves normally and symmetrically.  Motor: Normal bulk and tone and strength RUE and RLE.  LUE: 4/5 proximal and 2-3/5 hand with increased tone and mild/minimal swelling.  LLE: 5/5 Sensory.: intact to touch , pinprick , position and vibratory sensation.  Coordination: Rapid alternating movements normal in all extremities except left hand. Finger-to-nose performed accurately RUE and heel-to-shin performed accurately bilaterally. Gait and Station: Arises from chair without difficulty. Stance is normal. Gait demonstrates  normal stride length with decreased step height and use of cane.  Tandem walk and heel toe not attempted Reflexes: 1+ and symmetric. Toes downgoing.        ASSESSMENT: Jacob Short is a 75 y.o. year old male presented with right arm weakness and neglect  on 06/15/2020 with stroke work-up revealed left corona radiata infarct secondary to small vessel disease. Vascular risk factors include HTN, HLD, DM, tobacco use, EtOH use, CAD s/p PCI, AAA s/p repair 2018 and PAD w/ stent in leg.      PLAN:  L CR stroke :  Residual deficit: LUE weakness and gait impairment.   Continue working with outpatient PT/OT  Continue to follow with [facility] neurology receiving Botox for residual spasticity Continue to  follow with PMR Dr. Letta Pate for adhesive capsulitis Completed TRANSPORT 2 trial at [facility] with reported improvement take aspirin 325 mg daily and clopidogrel 75 mg daily -he was advised to schedule follow-up with cardiology and vascular surgery for further recommendations of aspirin dosage but as he was on full dose aspirin prior to his stroke, he was advised to return to that dosage unless otherwise instructed by cardiology or vascular surgery.  Remains on Crestor 10 mg daily and advised to ensure he is not also taking atorvastatin in addition.   Discussed secondary stroke prevention measures and importance of close PCP follow up for aggressive stroke risk factor management  HTN: BP goal <130/90.  Stable at home per patient on current regimen per PCP HLD: LDL goal <70.  Prior LDL 38.  On Crestor 10 mg daily per PCP - advised to ensure he is only on Crestor and not both atorvastatin and crestor together.  Request routine follow-up with PCP for routine monitoring of lipid panel and ongoing prescribing of statin DMII: A1c goal<7.0.  Monitor by PCP    Follow up in 6 months or call earlier if needed   CC:  [facility] provider: Dr. Luberta Mutter, MD    I spent 40 minutes of face-to-face and non-face-to-face time with patient and wife.  This included previsit chart review, lab review, study review, order entry, electronic health record documentation, patient education regarding prior stroke, residual deficits, participation with [facility] trial, Botox and  ongoing therapy, importance of managing stroke risk factors and answered all other questions to patient and wife's satisfaction  Frann Rider, AGNP-BC  [facility] [address] [place], [place] [address]  Phone [phone] Fax [phone] Note: This document was prepared with digital dictation and possible smart phrase technology. Any transcriptional errors that result from this process are unintentional.

## 2021-09-26 DIAGNOSIS — M25651 Stiffness of right hip, not elsewhere classified: Secondary | ICD-10-CM | POA: Diagnosis not present

## 2021-09-26 DIAGNOSIS — N132 Hydronephrosis with renal and ureteral calculous obstruction: Secondary | ICD-10-CM | POA: Diagnosis not present

## 2021-09-26 DIAGNOSIS — R2681 Unsteadiness on feet: Secondary | ICD-10-CM | POA: Diagnosis not present

## 2021-09-26 DIAGNOSIS — R5383 Other fatigue: Secondary | ICD-10-CM | POA: Diagnosis not present

## 2021-09-26 DIAGNOSIS — G8111 Spastic hemiplegia affecting right dominant side: Secondary | ICD-10-CM | POA: Diagnosis not present

## 2021-09-26 DIAGNOSIS — M2560 Stiffness of unspecified joint, not elsewhere classified: Secondary | ICD-10-CM | POA: Diagnosis not present

## 2021-09-26 DIAGNOSIS — M25652 Stiffness of left hip, not elsewhere classified: Secondary | ICD-10-CM | POA: Diagnosis not present

## 2021-09-26 DIAGNOSIS — R2689 Other abnormalities of gait and mobility: Secondary | ICD-10-CM | POA: Diagnosis not present

## 2021-09-26 DIAGNOSIS — M25662 Stiffness of left knee, not elsewhere classified: Secondary | ICD-10-CM | POA: Diagnosis not present

## 2021-09-26 DIAGNOSIS — M6281 Muscle weakness (generalized): Secondary | ICD-10-CM | POA: Diagnosis not present

## 2021-09-26 DIAGNOSIS — M25661 Stiffness of right knee, not elsewhere classified: Secondary | ICD-10-CM | POA: Diagnosis not present

## 2021-10-03 DIAGNOSIS — R82998 Other abnormal findings in urine: Secondary | ICD-10-CM | POA: Diagnosis not present

## 2021-10-09 DIAGNOSIS — G8929 Other chronic pain: Secondary | ICD-10-CM | POA: Diagnosis not present

## 2021-10-09 DIAGNOSIS — E785 Hyperlipidemia, unspecified: Secondary | ICD-10-CM | POA: Diagnosis not present

## 2021-10-09 DIAGNOSIS — I2510 Atherosclerotic heart disease of native coronary artery without angina pectoris: Secondary | ICD-10-CM | POA: Diagnosis not present

## 2021-10-09 DIAGNOSIS — E559 Vitamin D deficiency, unspecified: Secondary | ICD-10-CM | POA: Diagnosis not present

## 2021-10-09 DIAGNOSIS — E1165 Type 2 diabetes mellitus with hyperglycemia: Secondary | ICD-10-CM | POA: Diagnosis not present

## 2021-10-09 DIAGNOSIS — E1142 Type 2 diabetes mellitus with diabetic polyneuropathy: Secondary | ICD-10-CM | POA: Diagnosis not present

## 2021-10-09 DIAGNOSIS — E1121 Type 2 diabetes mellitus with diabetic nephropathy: Secondary | ICD-10-CM | POA: Diagnosis not present

## 2021-10-09 DIAGNOSIS — I69351 Hemiplegia and hemiparesis following cerebral infarction affecting right dominant side: Secondary | ICD-10-CM | POA: Diagnosis not present

## 2021-10-09 DIAGNOSIS — E1151 Type 2 diabetes mellitus with diabetic peripheral angiopathy without gangrene: Secondary | ICD-10-CM | POA: Diagnosis not present

## 2021-10-09 DIAGNOSIS — E1169 Type 2 diabetes mellitus with other specified complication: Secondary | ICD-10-CM | POA: Diagnosis not present

## 2021-10-09 DIAGNOSIS — I723 Aneurysm of iliac artery: Secondary | ICD-10-CM | POA: Diagnosis not present

## 2021-10-09 DIAGNOSIS — I10 Essential (primary) hypertension: Secondary | ICD-10-CM | POA: Diagnosis not present

## 2021-10-25 DIAGNOSIS — N132 Hydronephrosis with renal and ureteral calculous obstruction: Secondary | ICD-10-CM | POA: Diagnosis not present

## 2021-10-25 DIAGNOSIS — G8111 Spastic hemiplegia affecting right dominant side: Secondary | ICD-10-CM | POA: Diagnosis not present

## 2021-10-25 DIAGNOSIS — R278 Other lack of coordination: Secondary | ICD-10-CM | POA: Diagnosis not present

## 2021-10-25 DIAGNOSIS — M25611 Stiffness of right shoulder, not elsewhere classified: Secondary | ICD-10-CM | POA: Diagnosis not present

## 2021-10-25 DIAGNOSIS — M6281 Muscle weakness (generalized): Secondary | ICD-10-CM | POA: Diagnosis not present

## 2021-10-25 DIAGNOSIS — R2689 Other abnormalities of gait and mobility: Secondary | ICD-10-CM | POA: Diagnosis not present

## 2021-11-12 DIAGNOSIS — Z6821 Body mass index (BMI) 21.0-21.9, adult: Secondary | ICD-10-CM | POA: Diagnosis not present

## 2021-11-12 DIAGNOSIS — I10 Essential (primary) hypertension: Secondary | ICD-10-CM | POA: Diagnosis not present

## 2021-11-12 DIAGNOSIS — E7800 Pure hypercholesterolemia, unspecified: Secondary | ICD-10-CM | POA: Diagnosis not present

## 2021-11-12 DIAGNOSIS — Z1331 Encounter for screening for depression: Secondary | ICD-10-CM | POA: Diagnosis not present

## 2021-11-12 DIAGNOSIS — Z0000 Encounter for general adult medical examination without abnormal findings: Secondary | ICD-10-CM | POA: Diagnosis not present

## 2021-11-12 DIAGNOSIS — I679 Cerebrovascular disease, unspecified: Secondary | ICD-10-CM | POA: Diagnosis not present

## 2021-11-12 DIAGNOSIS — E1159 Type 2 diabetes mellitus with other circulatory complications: Secondary | ICD-10-CM | POA: Diagnosis not present

## 2021-11-12 DIAGNOSIS — Z125 Encounter for screening for malignant neoplasm of prostate: Secondary | ICD-10-CM | POA: Diagnosis not present

## 2021-11-12 DIAGNOSIS — I2510 Atherosclerotic heart disease of native coronary artery without angina pectoris: Secondary | ICD-10-CM | POA: Diagnosis not present

## 2021-11-19 DIAGNOSIS — I69351 Hemiplegia and hemiparesis following cerebral infarction affecting right dominant side: Secondary | ICD-10-CM | POA: Diagnosis not present

## 2021-11-19 DIAGNOSIS — I10 Essential (primary) hypertension: Secondary | ICD-10-CM | POA: Diagnosis not present

## 2021-11-19 DIAGNOSIS — Z7902 Long term (current) use of antithrombotics/antiplatelets: Secondary | ICD-10-CM | POA: Diagnosis not present

## 2021-11-19 DIAGNOSIS — I2510 Atherosclerotic heart disease of native coronary artery without angina pectoris: Secondary | ICD-10-CM | POA: Diagnosis not present

## 2021-11-19 DIAGNOSIS — Z7984 Long term (current) use of oral hypoglycemic drugs: Secondary | ICD-10-CM | POA: Diagnosis not present

## 2021-11-19 DIAGNOSIS — E785 Hyperlipidemia, unspecified: Secondary | ICD-10-CM | POA: Diagnosis not present

## 2021-11-19 DIAGNOSIS — Z7982 Long term (current) use of aspirin: Secondary | ICD-10-CM | POA: Diagnosis not present

## 2021-11-19 DIAGNOSIS — E119 Type 2 diabetes mellitus without complications: Secondary | ICD-10-CM | POA: Diagnosis not present

## 2021-11-19 DIAGNOSIS — Z7985 Long-term (current) use of injectable non-insulin antidiabetic drugs: Secondary | ICD-10-CM | POA: Diagnosis not present

## 2021-11-23 DIAGNOSIS — G8111 Spastic hemiplegia affecting right dominant side: Secondary | ICD-10-CM | POA: Diagnosis not present

## 2021-11-30 DIAGNOSIS — N132 Hydronephrosis with renal and ureteral calculous obstruction: Secondary | ICD-10-CM | POA: Diagnosis not present

## 2021-11-30 DIAGNOSIS — G8111 Spastic hemiplegia affecting right dominant side: Secondary | ICD-10-CM | POA: Diagnosis not present

## 2021-11-30 DIAGNOSIS — R2689 Other abnormalities of gait and mobility: Secondary | ICD-10-CM | POA: Diagnosis not present

## 2021-11-30 DIAGNOSIS — M6281 Muscle weakness (generalized): Secondary | ICD-10-CM | POA: Diagnosis not present

## 2021-11-30 DIAGNOSIS — R278 Other lack of coordination: Secondary | ICD-10-CM | POA: Diagnosis not present

## 2021-11-30 DIAGNOSIS — M25611 Stiffness of right shoulder, not elsewhere classified: Secondary | ICD-10-CM | POA: Diagnosis not present

## 2021-12-07 DIAGNOSIS — Z1212 Encounter for screening for malignant neoplasm of rectum: Secondary | ICD-10-CM | POA: Diagnosis not present

## 2021-12-07 DIAGNOSIS — Z1211 Encounter for screening for malignant neoplasm of colon: Secondary | ICD-10-CM | POA: Diagnosis not present

## 2022-01-01 DIAGNOSIS — G8111 Spastic hemiplegia affecting right dominant side: Secondary | ICD-10-CM | POA: Diagnosis not present

## 2022-01-01 DIAGNOSIS — N132 Hydronephrosis with renal and ureteral calculous obstruction: Secondary | ICD-10-CM | POA: Diagnosis not present

## 2022-01-01 DIAGNOSIS — M25611 Stiffness of right shoulder, not elsewhere classified: Secondary | ICD-10-CM | POA: Diagnosis not present

## 2022-01-01 DIAGNOSIS — M6281 Muscle weakness (generalized): Secondary | ICD-10-CM | POA: Diagnosis not present

## 2022-01-01 DIAGNOSIS — R278 Other lack of coordination: Secondary | ICD-10-CM | POA: Diagnosis not present

## 2022-01-02 DIAGNOSIS — R531 Weakness: Secondary | ICD-10-CM | POA: Diagnosis not present

## 2022-01-03 ENCOUNTER — Other Ambulatory Visit: Payer: Self-pay | Admitting: Internal Medicine

## 2022-01-03 ENCOUNTER — Other Ambulatory Visit (HOSPITAL_COMMUNITY): Payer: Self-pay | Admitting: Internal Medicine

## 2022-01-03 DIAGNOSIS — I69351 Hemiplegia and hemiparesis following cerebral infarction affecting right dominant side: Secondary | ICD-10-CM

## 2022-01-03 DIAGNOSIS — Z794 Long term (current) use of insulin: Secondary | ICD-10-CM

## 2022-01-04 ENCOUNTER — Ambulatory Visit (HOSPITAL_COMMUNITY)
Admission: RE | Admit: 2022-01-04 | Discharge: 2022-01-04 | Disposition: A | Discharge status: Home / Self Care | Payer: HMO | Source: Ambulatory Visit | Attending: Internal Medicine | Admitting: Internal Medicine

## 2022-01-04 DIAGNOSIS — I639 Cerebral infarction, unspecified: Secondary | ICD-10-CM | POA: Diagnosis not present

## 2022-01-04 DIAGNOSIS — Z794 Long term (current) use of insulin: Secondary | ICD-10-CM | POA: Insufficient documentation

## 2022-01-04 DIAGNOSIS — I6782 Cerebral ischemia: Secondary | ICD-10-CM | POA: Diagnosis not present

## 2022-01-04 DIAGNOSIS — E1159 Type 2 diabetes mellitus with other circulatory complications: Secondary | ICD-10-CM | POA: Diagnosis not present

## 2022-01-04 DIAGNOSIS — I69351 Hemiplegia and hemiparesis following cerebral infarction affecting right dominant side: Secondary | ICD-10-CM | POA: Insufficient documentation

## 2022-01-04 MED ORDER — GADOBUTROL 1 MMOL/ML IV SOLN
10.0000 mL | Freq: Once | INTRAVENOUS | Status: AC | PRN
  Administered 2022-01-04: 10 mL via INTRAVENOUS

## 2022-01-23 DIAGNOSIS — N132 Hydronephrosis with renal and ureteral calculous obstruction: Secondary | ICD-10-CM | POA: Diagnosis not present

## 2022-01-23 DIAGNOSIS — R2689 Other abnormalities of gait and mobility: Secondary | ICD-10-CM | POA: Diagnosis not present

## 2022-01-23 DIAGNOSIS — M25611 Stiffness of right shoulder, not elsewhere classified: Secondary | ICD-10-CM | POA: Diagnosis not present

## 2022-01-23 DIAGNOSIS — R278 Other lack of coordination: Secondary | ICD-10-CM | POA: Diagnosis not present

## 2022-01-23 DIAGNOSIS — G8111 Spastic hemiplegia affecting right dominant side: Secondary | ICD-10-CM | POA: Diagnosis not present

## 2022-01-23 DIAGNOSIS — M6281 Muscle weakness (generalized): Secondary | ICD-10-CM | POA: Diagnosis not present

## 2022-01-28 DIAGNOSIS — L03012 Cellulitis of left finger: Secondary | ICD-10-CM | POA: Diagnosis not present

## 2022-02-01 DIAGNOSIS — L03012 Cellulitis of left finger: Secondary | ICD-10-CM | POA: Diagnosis not present

## 2022-02-07 DIAGNOSIS — L03012 Cellulitis of left finger: Secondary | ICD-10-CM | POA: Diagnosis not present

## 2022-02-07 DIAGNOSIS — M7989 Other specified soft tissue disorders: Secondary | ICD-10-CM | POA: Diagnosis not present

## 2022-02-11 DIAGNOSIS — E1159 Type 2 diabetes mellitus with other circulatory complications: Secondary | ICD-10-CM | POA: Diagnosis not present

## 2022-02-21 DIAGNOSIS — M25662 Stiffness of left knee, not elsewhere classified: Secondary | ICD-10-CM | POA: Diagnosis not present

## 2022-02-21 DIAGNOSIS — R278 Other lack of coordination: Secondary | ICD-10-CM | POA: Diagnosis not present

## 2022-02-21 DIAGNOSIS — R2681 Unsteadiness on feet: Secondary | ICD-10-CM | POA: Diagnosis not present

## 2022-02-21 DIAGNOSIS — M25661 Stiffness of right knee, not elsewhere classified: Secondary | ICD-10-CM | POA: Diagnosis not present

## 2022-02-21 DIAGNOSIS — N132 Hydronephrosis with renal and ureteral calculous obstruction: Secondary | ICD-10-CM | POA: Diagnosis not present

## 2022-02-21 DIAGNOSIS — M2560 Stiffness of unspecified joint, not elsewhere classified: Secondary | ICD-10-CM | POA: Diagnosis not present

## 2022-02-21 DIAGNOSIS — R2689 Other abnormalities of gait and mobility: Secondary | ICD-10-CM | POA: Diagnosis not present

## 2022-02-21 DIAGNOSIS — M6281 Muscle weakness (generalized): Secondary | ICD-10-CM | POA: Diagnosis not present

## 2022-02-22 DIAGNOSIS — G8111 Spastic hemiplegia affecting right dominant side: Secondary | ICD-10-CM | POA: Diagnosis not present

## 2022-02-25 DIAGNOSIS — I10 Essential (primary) hypertension: Secondary | ICD-10-CM | POA: Diagnosis not present

## 2022-02-25 DIAGNOSIS — Z7984 Long term (current) use of oral hypoglycemic drugs: Secondary | ICD-10-CM | POA: Diagnosis not present

## 2022-02-25 DIAGNOSIS — E785 Hyperlipidemia, unspecified: Secondary | ICD-10-CM | POA: Diagnosis not present

## 2022-02-25 DIAGNOSIS — Z7902 Long term (current) use of antithrombotics/antiplatelets: Secondary | ICD-10-CM | POA: Diagnosis not present

## 2022-02-25 DIAGNOSIS — Z7985 Long-term (current) use of injectable non-insulin antidiabetic drugs: Secondary | ICD-10-CM | POA: Diagnosis not present

## 2022-02-25 DIAGNOSIS — Z7982 Long term (current) use of aspirin: Secondary | ICD-10-CM | POA: Diagnosis not present

## 2022-02-25 DIAGNOSIS — E119 Type 2 diabetes mellitus without complications: Secondary | ICD-10-CM | POA: Diagnosis not present

## 2022-02-25 DIAGNOSIS — I69351 Hemiplegia and hemiparesis following cerebral infarction affecting right dominant side: Secondary | ICD-10-CM | POA: Diagnosis not present

## 2022-02-25 DIAGNOSIS — I2510 Atherosclerotic heart disease of native coronary artery without angina pectoris: Secondary | ICD-10-CM | POA: Diagnosis not present

## 2022-03-04 DIAGNOSIS — L0390 Cellulitis, unspecified: Secondary | ICD-10-CM | POA: Diagnosis not present

## 2022-04-09 DIAGNOSIS — R2681 Unsteadiness on feet: Secondary | ICD-10-CM | POA: Diagnosis not present

## 2022-04-09 DIAGNOSIS — M6281 Muscle weakness (generalized): Secondary | ICD-10-CM | POA: Diagnosis not present

## 2022-04-09 DIAGNOSIS — N132 Hydronephrosis with renal and ureteral calculous obstruction: Secondary | ICD-10-CM | POA: Diagnosis not present

## 2022-04-09 DIAGNOSIS — M2560 Stiffness of unspecified joint, not elsewhere classified: Secondary | ICD-10-CM | POA: Diagnosis not present

## 2022-04-09 DIAGNOSIS — R2689 Other abnormalities of gait and mobility: Secondary | ICD-10-CM | POA: Diagnosis not present

## 2022-04-09 DIAGNOSIS — M25662 Stiffness of left knee, not elsewhere classified: Secondary | ICD-10-CM | POA: Diagnosis not present

## 2022-04-09 DIAGNOSIS — R278 Other lack of coordination: Secondary | ICD-10-CM | POA: Diagnosis not present

## 2022-05-02 DIAGNOSIS — R2689 Other abnormalities of gait and mobility: Secondary | ICD-10-CM | POA: Diagnosis not present

## 2022-05-02 DIAGNOSIS — R2681 Unsteadiness on feet: Secondary | ICD-10-CM | POA: Diagnosis not present

## 2022-05-02 DIAGNOSIS — M6281 Muscle weakness (generalized): Secondary | ICD-10-CM | POA: Diagnosis not present

## 2022-05-02 DIAGNOSIS — M25662 Stiffness of left knee, not elsewhere classified: Secondary | ICD-10-CM | POA: Diagnosis not present

## 2022-05-02 DIAGNOSIS — R278 Other lack of coordination: Secondary | ICD-10-CM | POA: Diagnosis not present

## 2022-05-02 DIAGNOSIS — N132 Hydronephrosis with renal and ureteral calculous obstruction: Secondary | ICD-10-CM | POA: Diagnosis not present

## 2022-05-02 DIAGNOSIS — M25661 Stiffness of right knee, not elsewhere classified: Secondary | ICD-10-CM | POA: Diagnosis not present

## 2022-05-02 DIAGNOSIS — M2560 Stiffness of unspecified joint, not elsewhere classified: Secondary | ICD-10-CM | POA: Diagnosis not present

## 2022-05-17 DIAGNOSIS — G8111 Spastic hemiplegia affecting right dominant side: Secondary | ICD-10-CM | POA: Diagnosis not present

## 2022-05-30 DIAGNOSIS — M6281 Muscle weakness (generalized): Secondary | ICD-10-CM | POA: Diagnosis not present

## 2022-05-30 DIAGNOSIS — R2689 Other abnormalities of gait and mobility: Secondary | ICD-10-CM | POA: Diagnosis not present

## 2022-05-30 DIAGNOSIS — N132 Hydronephrosis with renal and ureteral calculous obstruction: Secondary | ICD-10-CM | POA: Diagnosis not present

## 2022-06-10 DIAGNOSIS — E1159 Type 2 diabetes mellitus with other circulatory complications: Secondary | ICD-10-CM | POA: Diagnosis not present

## 2022-06-10 DIAGNOSIS — I10 Essential (primary) hypertension: Secondary | ICD-10-CM | POA: Diagnosis not present

## 2022-06-26 DIAGNOSIS — M25661 Stiffness of right knee, not elsewhere classified: Secondary | ICD-10-CM | POA: Diagnosis not present

## 2022-06-26 DIAGNOSIS — R278 Other lack of coordination: Secondary | ICD-10-CM | POA: Diagnosis not present

## 2022-06-26 DIAGNOSIS — M2560 Stiffness of unspecified joint, not elsewhere classified: Secondary | ICD-10-CM | POA: Diagnosis not present

## 2022-06-26 DIAGNOSIS — N132 Hydronephrosis with renal and ureteral calculous obstruction: Secondary | ICD-10-CM | POA: Diagnosis not present

## 2022-06-26 DIAGNOSIS — M6281 Muscle weakness (generalized): Secondary | ICD-10-CM | POA: Diagnosis not present

## 2022-06-26 DIAGNOSIS — R2681 Unsteadiness on feet: Secondary | ICD-10-CM | POA: Diagnosis not present

## 2022-06-26 DIAGNOSIS — M25662 Stiffness of left knee, not elsewhere classified: Secondary | ICD-10-CM | POA: Diagnosis not present

## 2022-06-26 DIAGNOSIS — R2689 Other abnormalities of gait and mobility: Secondary | ICD-10-CM | POA: Diagnosis not present

## 2022-07-22 DIAGNOSIS — Z23 Encounter for immunization: Secondary | ICD-10-CM | POA: Diagnosis not present

## 2022-08-09 DIAGNOSIS — G8111 Spastic hemiplegia affecting right dominant side: Secondary | ICD-10-CM | POA: Diagnosis not present

## 2022-08-23 DIAGNOSIS — I69351 Hemiplegia and hemiparesis following cerebral infarction affecting right dominant side: Secondary | ICD-10-CM | POA: Diagnosis not present

## 2022-08-23 DIAGNOSIS — I10 Essential (primary) hypertension: Secondary | ICD-10-CM | POA: Diagnosis not present

## 2022-09-03 ENCOUNTER — Other Ambulatory Visit: Payer: Self-pay

## 2022-09-03 MED ORDER — METHOCARBAMOL 500 MG PO TABS: ORAL_TABLET | ORAL | 0 refills | Status: DC

## 2022-09-04 ENCOUNTER — Other Ambulatory Visit: Payer: Self-pay

## 2022-09-04 MED ORDER — LOSARTAN POTASSIUM 50 MG PO TABS: 50.0000 mg | ORAL_TABLET | Freq: Every day | ORAL | 0 refills | Status: DC

## 2022-09-09 ENCOUNTER — Encounter: Payer: Self-pay | Admitting: Internal Medicine

## 2022-09-09 ENCOUNTER — Ambulatory Visit: Payer: PPO | Admitting: Internal Medicine

## 2022-09-09 VITALS — BP 128/72 | HR 78 | Temp 98.3°F | Resp 18 | Ht 76.0 in | Wt 172.4 lb

## 2022-09-09 DIAGNOSIS — E1159 Type 2 diabetes mellitus with other circulatory complications: Secondary | ICD-10-CM

## 2022-09-09 DIAGNOSIS — Z139 Encounter for screening, unspecified: Secondary | ICD-10-CM | POA: Diagnosis not present

## 2022-09-09 NOTE — Assessment & Plan Note (Signed)
His diabetes has been controlled. I gave him samples of farixga '5mg'$  daily.  We will check his HgBA1c on his next visit.

## 2022-09-09 NOTE — Progress Notes (Signed)
Office Visit  Subjective   Patient ID: Jacob Short   DOB: [birth year]   Age: 76 y.o.   MRN: [id number]   Chief Complaint Chief Complaint  Patient presents with   Follow-up    HTN and Diabetes;needs refills on Farxiga     History of Present Illness The patient is a 76 year old Caucasian/White male who returns for a follow-up visit for his T2 diabetes.  Since his last visit, he states he has not had any problems.  He is no longer on Trulicity as he ran in his donut hole.  He remains on metformin 2000 mg daily dose and  farxiga '5mg'$  daily.  He specifically denies chest pain, shortness of breath, unexplained fatigue, unexplained abdominal pain, nausea or vomiting, and documented hypoglycemia. He is not regularly checking his FSBS.  He came in fasting today in anticipation of lab work. His last HgbA1c was done 3 months ago and was 6.9%.  He does not have a history of diabetic retinopathy, neuropathy or nephropathy noted.  He does have a history of mild CAD with stent placement in the past.     Past Medical History Past Medical History:  Diagnosis Date   BPH (benign prostatic hyperplasia)    Cerebrovascular disease    Coronary artery disease    History of kidney stones    Hyperlipidemia    Hypertension    Peripheral vascular disease (HCC)    T2DM (type 2 diabetes mellitus) (HCC)    Tobacco abuse      Allergies Allergies  Allergen Reactions   Lisinopril Swelling     Review of Systems Review of Systems  Constitutional:  Negative for chills and fever.  Eyes:  Negative for blurred vision and double vision.  Respiratory:  Negative for cough and shortness of breath.   Cardiovascular:  Negative for chest pain, palpitations and leg swelling.  Gastrointestinal:  Negative for constipation, diarrhea, nausea and vomiting.  Musculoskeletal:  Negative for myalgias.  Skin:  Negative for itching and rash.  Neurological:  Negative for dizziness, weakness and headaches.        Objective:    Vitals BP 128/72 (BP Location: Left Arm, Patient Position: Sitting, Cuff Size: Normal)   Pulse 78   Temp 98.3 F (36.8 C) (Temporal)   Resp 18   Ht '6\' 4"'$  (1.93 m)   Wt 172 lb 6.4 oz (78.2 kg)   SpO2 99%   BMI 20.99 kg/m    Physical Examination Physical Exam Constitutional:      Appearance: Normal appearance. He is not ill-appearing.  Cardiovascular:     Rate and Rhythm: Normal rate and regular rhythm.     Pulses: Normal pulses.     Heart sounds: No murmur heard.    No friction rub. No gallop.  Pulmonary:     Effort: Pulmonary effort is normal. No respiratory distress.     Breath sounds: No wheezing, rhonchi or rales.  Abdominal:     General: Abdomen is flat. Bowel sounds are normal. There is no distension.     Palpations: Abdomen is soft.     Tenderness: There is no abdominal tenderness.  Musculoskeletal:     Right lower leg: No edema.     Left lower leg: No edema.  Skin:    General: Skin is warm and dry.     Findings: No rash.  Neurological:     General: No focal deficit present.     Mental Status: He is alert and oriented  to person, place, and time.  Psychiatric:        Mood and Affect: Mood normal.        Behavior: Behavior normal.        Assessment & Plan:   Type 2 diabetes mellitus with vascular disease ([place]) His diabetes has been controlled. I gave him samples of farixga '5mg'$  daily.  We will check his HgBA1c on his next visit.    Return in about 3 months (around 12/09/2022) for annual.   Townsend Roger, MD

## 2022-09-19 ENCOUNTER — Other Ambulatory Visit: Payer: Self-pay

## 2022-09-19 MED ORDER — BACLOFEN 5 MG PO TABS: 5.0000 mg | ORAL_TABLET | Freq: Three times a day (TID) | ORAL | 3 refills | Status: DC

## 2022-09-24 ENCOUNTER — Other Ambulatory Visit: Payer: Self-pay

## 2022-09-24 MED ORDER — CLOPIDOGREL BISULFATE 75 MG PO TABS: 75.0000 mg | ORAL_TABLET | Freq: Every day | ORAL | 5 refills | Status: DC

## 2022-09-24 MED ORDER — CARVEDILOL 12.5 MG PO TABS: 12.5000 mg | ORAL_TABLET | Freq: Two times a day (BID) | ORAL | 1 refills | Status: DC

## 2022-09-24 MED ORDER — METHOCARBAMOL 500 MG PO TABS: 500.0000 mg | ORAL_TABLET | Freq: Four times a day (QID) | ORAL | 1 refills | Status: DC | PRN

## 2022-10-07 ENCOUNTER — Other Ambulatory Visit: Payer: Self-pay

## 2022-10-07 MED ORDER — METFORMIN HCL ER 500 MG PO TB24: 1000.0000 mg | ORAL_TABLET | Freq: Two times a day (BID) | ORAL | 1 refills | Status: DC

## 2022-10-18 ENCOUNTER — Other Ambulatory Visit: Payer: Self-pay

## 2022-10-18 MED ORDER — ROSUVASTATIN CALCIUM 10 MG PO TABS: 10.0000 mg | ORAL_TABLET | Freq: Every day | ORAL | 1 refills | Status: DC

## 2022-10-31 ENCOUNTER — Encounter: Payer: Self-pay | Admitting: Internal Medicine

## 2022-10-31 ENCOUNTER — Ambulatory Visit: Payer: PPO | Admitting: Internal Medicine

## 2022-10-31 VITALS — BP 130/64 | HR 79 | Temp 97.3°F | Resp 16 | Ht 76.0 in | Wt 176.6 lb

## 2022-10-31 DIAGNOSIS — I2510 Atherosclerotic heart disease of native coronary artery without angina pectoris: Secondary | ICD-10-CM | POA: Insufficient documentation

## 2022-10-31 NOTE — Progress Notes (Signed)
Office Visit  Subjective   Patient ID: Jacob Short   DOB: 1946/06/28   Age: 77 y.o.   MRN: [id number]   Chief Complaint Chief Complaint  Patient presents with   Follow-up    Diabetes     History of Present Illness Jacob Short is a 77 yo gentleman who has moderately severe CAD of about 10 years known duration and presents today for referral for a new cardiologist.  His last visit was with [facility] cardiology was in 07/2021.  The patient was diagnosed with CAD in 2003 where he was having chest pain and was sent to [facility] where they did cardiac cath and found he had 2 lesions.  He had drug eluting stents placed in the proximal and mid RCA at that time.  He remains on ASA and plavix.  His previous heart catherization was around 2003 where he had one stent placed. He has never had a MI. The patient did see cardiology in 07/2011 where they did a stress test and this was positive for anterior wall ischemia with an EF 51%. He went to [facility] for an elective hearth catherization.  He tells me they found mild blockage at that time but he did not need intervention and they decided to go with medical management.  His CAD is controlled with therapy as summarized in the medication list and previous notes.  The patient has no comorbid conditions. He has no baseline symptoms of CAD. He has the following modifiable risk factor(s): smoking, HTN, DM, and hyperlipidemia.  Specifically denied complaint(s): chest pain, palpitations, orthopnea, edema, exertional dyspnea, and syncope.  His last visit with cardiology was last year.         Past Medical History Past Medical History:  Diagnosis Date   BPH (benign prostatic hyperplasia)    Cerebrovascular disease    Coronary artery disease    History of kidney stones    Hyperlipidemia    Hypertension    Peripheral vascular disease (HCC)    T2DM (type 2 diabetes mellitus) (HCC)    Tobacco abuse      Allergies Allergies  Allergen Reactions    Lisinopril Swelling     Medications  Current Outpatient Medications:    acetaminophen (TYLENOL) 325 MG tablet, Take 2 tablets (650 mg total) by mouth every 6 (six) hours as needed for mild pain (or Fever >/= 101)., Disp: , Rfl:    aspirin EC 325 MG tablet, Take 1 tablet (325 mg total) by mouth daily., Disp: 30 tablet, Rfl: 0   Baclofen 5 MG TABS, Take 5 mg by mouth in the morning, at noon, and at bedtime., Disp: 90 tablet, Rfl: 3   carvedilol (COREG) 12.5 MG tablet, Take 1 tablet (12.5 mg total) by mouth 2 (two) times daily with a meal., Disp: 120 tablet, Rfl: 1   Cholecalciferol (VITAMIN D) 50 MCG (2000 UT) tablet, Take 2,000 Units by mouth daily., Disp: , Rfl:    clopidogrel (PLAVIX) 75 MG tablet, Take 1 tablet (75 mg total) by mouth daily., Disp: 30 tablet, Rfl: 5   Continuous Blood Gluc Receiver (FREESTYLE LIBRE 2 READER) DEVI, USE AS DIRECTED TO MONITOR GLUCOSE CONTINOUSLY, Disp: , Rfl:    dapagliflozin propanediol (FARXIGA) 10 MG TABS tablet, Take 5 mg by mouth daily., Disp: , Rfl:    Dulaglutide (TRULICITY) 9.51 OA/4.1YS SOPN, Inject into the skin once a week., Disp: , Rfl:    losartan (COZAAR) 50 MG tablet, Take 1 tablet (50 mg total) by  mouth daily., Disp: 90 tablet, Rfl: 0   metFORMIN (GLUCOPHAGE-XR) 500 MG 24 hr tablet, Take 2 tablets (1,000 mg total) by mouth 2 (two) times daily with a meal., Disp: 270 tablet, Rfl: 1   methocarbamol (ROBAXIN) 500 MG tablet, Take 1 tablet (500 mg total) by mouth every 6 (six) hours as needed for muscle spasms., Disp: 90 tablet, Rfl: 1   nitroGLYCERIN (NITROSTAT) 0.4 MG SL tablet, Place under the tongue., Disp: , Rfl:    rosuvastatin (CRESTOR) 10 MG tablet, Take 1 tablet (10 mg total) by mouth daily., Disp: 90 tablet, Rfl: 1   Review of Systems Review of Systems  Constitutional:  Negative for chills and fever.  Eyes:  Negative for blurred vision and double vision.  Respiratory:  Negative for cough and shortness of breath.   Cardiovascular:   Negative for chest pain, palpitations and leg swelling.  Gastrointestinal:  Negative for abdominal pain, constipation, diarrhea, nausea and vomiting.  Musculoskeletal:  Negative for myalgias.  Neurological:  Negative for dizziness, weakness and headaches.       Objective:    Vitals BP 130/64   Pulse 79   Temp (!) 97.3 F (36.3 C)   Resp 16   Ht '6\' 4"'$  (1.93 m)   Wt 176 lb 9.6 oz (80.1 kg)   SpO2 99%   BMI 21.50 kg/m    Physical Examination Physical Exam Constitutional:      Appearance: Normal appearance. He is not ill-appearing.  Cardiovascular:     Rate and Rhythm: Normal rate and regular rhythm.     Pulses: Normal pulses.     Heart sounds: No murmur heard.    No friction rub. No gallop.  Pulmonary:     Effort: Pulmonary effort is normal. No respiratory distress.     Breath sounds: No wheezing, rhonchi or rales.  Abdominal:     General: Abdomen is flat. Bowel sounds are normal. There is no distension.     Palpations: Abdomen is soft.     Tenderness: There is no abdominal tenderness.  Musculoskeletal:     Right lower leg: No edema.     Left lower leg: No edema.  Skin:    General: Skin is warm and dry.     Findings: No rash.  Neurological:     Mental Status: He is alert.        Assessment & Plan:   Coronary artery disease involving native coronary artery of native heart without angina pectoris The patient has stable CAD at this time.  He remains on ASA, plavix, statin, beta blocker and ARB.  We will refer him to establish with cardiology.    No follow-ups on file.   Townsend Roger, MD

## 2022-10-31 NOTE — Assessment & Plan Note (Signed)
The patient has stable CAD at this time.  He remains on ASA, plavix, statin, beta blocker and ARB.  We will refer him to establish with cardiology.

## 2022-11-23 ENCOUNTER — Other Ambulatory Visit: Payer: Self-pay | Admitting: Internal Medicine

## 2022-11-25 NOTE — Progress Notes (Unsigned)
Cardiology Office Note:    Date:  11/26/2022   ID:  Jacob Short, DOB 11/25/1945, MRN [id number]  PCP:  Townsend Roger, MD  Cardiologist:  Shirlee More, MD   Referring MD: Townsend Roger, MD  ASSESSMENT:    1. Coronary artery disease involving native coronary artery of native heart without angina pectoris   2. Essential hypertension   3. Benign essential HTN   4. Type 2 diabetes mellitus with vascular disease (HCC)    PLAN:    In order of problems listed above:  He has done well since PCI and stent 2003 having no angina he has a good activity program his lipids have been ideal following his PCP office will continue his current medical regimen including aspirin high intensity statin and beta-blocker. Well-controlled continue treatment including ARB beta-blocker Stable diabetes managed by his PCP History of stroke carotid disease we will do a duplex in my office  Next appointment 6 months   Medication Adjustments/Labs and Tests Ordered: Current medicines are reviewed at length with the patient today.  Concerns regarding medicines are outlined above.  No orders of the defined types were placed in this encounter.  No orders of the defined types were placed in this encounter.    Chief Complaint  Patient presents with   Coronary Artery Disease    History of Present Illness:    Jacob Short is a 77 y.o. male with a history of stroke hypertension hyperlipidemia type 2 diabetes and peripheral vascular disease who is being seen today for the evaluation of coronary artery disease at the request of Townsend Roger, MD. he also has had EVAR for abdominal aortic aneurysm February 2018.  He has a history of CAD with PCI and stent proximal mid right coronary artery 2003.  He was last seen by cardiology [facility] 06/17/2017 there is a notation that prior to this visit he had a myocardial perfusion study which was normal and the ejection fraction low  normal.  He was seen previously on partner Dr. Geraldo Pitter the [facility] cardiology prior to 2017 Since PCI and stent he has had no edema shortness of breath orthopnea palpitation or syncope He exercises 30 to 40 minutes a day tolerates his statin without muscle pain or weakness in his last lipid profile February 2023 showed an LDL of 50  Previously had his carotids checked vascular surgery [place] but is overdue and will do in our office with a history of stroke.    Past Medical History:  Diagnosis Date   BPH (benign prostatic hyperplasia)    Cerebrovascular disease    Coronary artery disease    History of kidney stones    Hyperlipidemia    Hypertension    Peripheral vascular disease (HCC)    T2DM (type 2 diabetes mellitus) ([place])    Tobacco abuse     Past Surgical History:  Procedure Laterality Date   CORONARY ANGIOPLASTY      Current Medications: No outpatient medications have been marked as taking for the 11/26/22 encounter (Office Visit) with Richardo Priest, MD.     Allergies:   Lisinopril   Social History   Socioeconomic History   Marital status: Married    Spouse name: Not on file   Number of children: Not on file   Years of education: Not on file   Highest education level: Not on file  Occupational History   Not on file  Tobacco Use  Smoking status: Former    Types: Cigarettes    Quit date: 06/15/2020    Years since quitting: 2.4   Smokeless tobacco: Current  Vaping Use   Vaping Use: Never used  Substance and Sexual Activity   Alcohol use: Not Currently   Drug use: Not Currently   Sexual activity: Not on file  Other Topics Concern   Not on file  Social History Narrative   Not on file   Social Determinants of Health   Financial Resource Strain: Not on file  Food Insecurity: Not on file  Transportation Needs: Not on file  Physical Activity: Not on file  Stress: Not on file  Social Connections: Not on file     Family  History: The patient's family history is not on file.  ROS:   ROS Please see the history of present illness.     All other systems reviewed and are negative.  EKGs/Labs/Other Studies Reviewed:    The following studies were reviewed today:   Cardiac Studies & Procedures       ECHOCARDIOGRAM  ECHOCARDIOGRAM COMPLETE 06/16/2020  Narrative ECHOCARDIOGRAM REPORT    Patient Name:   Jacob Short Date of Exam: 06/16/2020 Medical Rec #:  [id number]       Height:       76.0 in Accession #:    [id number]      Weight:       181.0 lb Date of Birth:  1946-03-08        BSA:          2.123 m Patient Age:    49 years        BP:           183/103 mmHg Patient Gender: M               HR:           59 bpm. Exam Location:  Inpatient  Procedure: 2D Echo, Cardiac Doppler and Color Doppler  Indications:    Stroke 434.91 / I163.9  History:        Patient has no prior history of Echocardiogram examinations. CAD; Risk Factors:Diabetes.  Sonographer:    Bernadene Person RDCS Referring Phys: [id number] [place]   1. Left ventricular ejection fraction, by estimation, is 55 to 60%. The left ventricle has normal function. The left ventricle has no regional wall motion abnormalities. Left ventricular diastolic parameters are consistent with Grade I diastolic dysfunction (impaired relaxation). 2. Right ventricular systolic function is normal. The right ventricular size is normal. Tricuspid regurgitation signal is inadequate for assessing PA pressure. 3. The mitral valve is grossly normal. Trivial mitral valve regurgitation. No evidence of mitral stenosis. 4. The aortic valve is tricuspid. Aortic valve regurgitation is not visualized. No aortic stenosis is present. 5. The inferior vena cava is normal in size with greater than 50% respiratory variability, suggesting right atrial pressure of 3 mmHg.  Conclusion(s)/Recommendation(s): No intracardiac source of embolism detected on  this transthoracic study. A transesophageal echocardiogram is recommended to exclude cardiac source of embolism if clinically indicated.  FINDINGS Left Ventricle: Left ventricular ejection fraction, by estimation, is 55 to 60%. The left ventricle has normal function. The left ventricle has no regional wall motion abnormalities. The left ventricular internal cavity size was normal in size. There is no left ventricular hypertrophy. Left ventricular diastolic parameters are consistent with Grade I diastolic dysfunction (impaired relaxation). Normal left ventricular filling pressure.  Right Ventricle: The right ventricular size is  normal. No increase in right ventricular wall thickness. Right ventricular systolic function is normal. Tricuspid regurgitation signal is inadequate for assessing PA pressure.  Left Atrium: Left atrial size was normal in size.  Right Atrium: Right atrial size was normal in size. Prominent Eustachian valve.  Pericardium: Trivial pericardial effusion is present.  Mitral Valve: The mitral valve is grossly normal. Trivial mitral valve regurgitation. No evidence of mitral valve stenosis.  Tricuspid Valve: The tricuspid valve is grossly normal. Tricuspid valve regurgitation is trivial. No evidence of tricuspid stenosis.  Aortic Valve: The aortic valve is tricuspid. Aortic valve regurgitation is not visualized. No aortic stenosis is present.  Pulmonic Valve: The pulmonic valve was grossly normal. Pulmonic valve regurgitation is not visualized. No evidence of pulmonic stenosis.  Aorta: The aortic root and ascending aorta are structurally normal, with no evidence of dilitation.  Venous: The inferior vena cava is normal in size with greater than 50% respiratory variability, suggesting right atrial pressure of 3 mmHg.  IAS/Shunts: The atrial septum is grossly normal.  EKG: Rhythm strip during this exam demostrated normal sinus rhythm and premature ventricular  contractions.   LEFT VENTRICLE PLAX 2D LVIDd:         5.40 cm      Diastology LVIDs:         3.50 cm      LV e' medial:    4.30 cm/s LV PW:         1.30 cm      LV E/e' medial:  11.9 LV IVS:        1.00 cm      LV e' lateral:   7.54 cm/s LVOT diam:     2.40 cm      LV E/e' lateral: 6.8 LV SV:         92 LV SV Index:   43 LVOT Area:     4.52 cm  LV Volumes (MOD) LV vol d, MOD A2C: 118.0 ml LV vol d, MOD A4C: 118.0 ml LV vol s, MOD A2C: 65.3 ml LV vol s, MOD A4C: 66.1 ml LV SV MOD A2C:     52.7 ml LV SV MOD A4C:     118.0 ml LV SV MOD BP:      53.6 ml  RIGHT VENTRICLE RV S prime:     7.14 cm/s TAPSE (M-mode): 1.6 cm  LEFT ATRIUM             Index       RIGHT ATRIUM           Index LA diam:        3.10 cm 1.46 cm/m  RA Area:     14.50 cm LA Vol (A2C):   80.5 ml 37.91 ml/m RA Volume:   33.10 ml  15.59 ml/m LA Vol (A4C):   43.1 ml 20.30 ml/m LA Biplane Vol: 61.2 ml 28.82 ml/m AORTIC VALVE LVOT Vmax:   95.50 cm/s LVOT Vmean:  64.750 cm/s LVOT VTI:    0.202 m  AORTA Ao Root diam: 3.50 cm Ao Asc diam:  3.30 cm  MITRAL VALVE MV Area (PHT): 1.91 cm    SHUNTS MV Decel Time: 398 msec    Systemic VTI:  0.20 m MV E velocity: 51.00 cm/s  Systemic Diam: 2.40 cm MV A velocity: 67.90 cm/s MV E/A ratio:  0.75  Eleonore Chiquito MD Electronically signed by Eleonore Chiquito MD Signature Date/Time: 06/16/2020/10:58:42 AM    Final  EKG:  EKG is 2 sinus rhythm left axis deviation old septal MI ordered today.  TNo results found for requested labs within last 365 days.     Physical Exam:    VS:  Ht '6\' 4"'$  (1.93 m)   BMI 21.50 kg/m     Wt Readings from Last 3 Encounters:  10/31/22 176 lb 9.6 oz (80.1 kg)  09/09/22 172 lb 6.4 oz (78.2 kg)  03/29/21 188 lb 9.6 oz (85.5 kg)     GEN:  Well nourished, well developed in no acute distress HEENT: Normal NECK: No JVD; No carotid bruits LYMPHATICS: No lymphadenopathy CARDIAC: RRR, no murmurs, rubs,  gallops RESPIRATORY:  Clear to auscultation without rales, wheezing or rhonchi  ABDOMEN: Soft, non-tender, non-distended MUSCULOSKELETAL:  No edema; No deformity  SKIN: Warm and dry NEUROLOGIC:  Alert and oriented x 3 PSYCHIATRIC:  Normal affect     Signed, Shirlee More, MD  11/26/2022 1:49 PM    [facility]

## 2022-11-26 ENCOUNTER — Ambulatory Visit: Payer: PPO | Attending: Cardiology | Admitting: Cardiology

## 2022-11-26 ENCOUNTER — Encounter: Payer: Self-pay | Admitting: Cardiology

## 2022-11-26 VITALS — BP 118/70 | HR 70 | Ht 76.0 in | Wt 176.6 lb

## 2022-11-26 DIAGNOSIS — Z87442 Personal history of urinary calculi: Secondary | ICD-10-CM | POA: Insufficient documentation

## 2022-11-26 DIAGNOSIS — N400 Enlarged prostate without lower urinary tract symptoms: Secondary | ICD-10-CM | POA: Insufficient documentation

## 2022-11-26 DIAGNOSIS — I2510 Atherosclerotic heart disease of native coronary artery without angina pectoris: Secondary | ICD-10-CM | POA: Diagnosis not present

## 2022-11-26 DIAGNOSIS — I10 Essential (primary) hypertension: Secondary | ICD-10-CM

## 2022-11-26 DIAGNOSIS — E1159 Type 2 diabetes mellitus with other circulatory complications: Secondary | ICD-10-CM

## 2022-11-26 DIAGNOSIS — E785 Hyperlipidemia, unspecified: Secondary | ICD-10-CM | POA: Insufficient documentation

## 2022-11-26 NOTE — Patient Instructions (Signed)
Medication Instructions:  Your physician recommends that you continue on your current medications as directed. Please refer to the Current Medication list given to you today.  *If you need a refill on your cardiac medications before your next appointment, please call your pharmacy*   Lab Work: None If you have labs (blood work) drawn today and your tests are completely normal, you will receive your results only by: [place] (if you have MyChart) OR A paper copy in the mail If you have any lab test that is abnormal or we need to change your treatment, we will call you to review the results.   Testing/Procedures: Your physician has requested that you have a carotid duplex. This test is an ultrasound of the carotid arteries in your neck. It looks at blood flow through these arteries that supply the brain with blood. Allow one hour for this exam. There are no restrictions or special instructions.    Follow-Up: At [facility], you and your health needs are our priority.  As part of our continuing mission to provide you with exceptional heart care, we have created designated Provider Care Teams.  These Care Teams include your primary Cardiologist (physician) and Advanced Practice Providers (APPs -  Physician Assistants and Nurse Practitioners) who all work together to provide you with the care you need, when you need it.  We recommend signing up for the patient portal called "MyChart".  Sign up information is provided on this After Visit Summary.  MyChart is used to connect with patients for Virtual Visits (Telemedicine).  Patients are able to view lab/test results, encounter notes, upcoming appointments, etc.  Non-urgent messages can be sent to your provider as well.   To learn more about what you can do with MyChart, go to [email or web address].    Your next appointment:   6 month(s)  Provider:   Shirlee More, MD    Other Instructions None .

## 2022-12-02 ENCOUNTER — Other Ambulatory Visit: Payer: Self-pay | Admitting: Internal Medicine

## 2022-12-09 ENCOUNTER — Ambulatory Visit: Payer: PPO | Attending: Cardiology

## 2022-12-09 DIAGNOSIS — E1159 Type 2 diabetes mellitus with other circulatory complications: Secondary | ICD-10-CM

## 2022-12-09 DIAGNOSIS — I2510 Atherosclerotic heart disease of native coronary artery without angina pectoris: Secondary | ICD-10-CM

## 2022-12-09 DIAGNOSIS — I10 Essential (primary) hypertension: Secondary | ICD-10-CM | POA: Diagnosis not present

## 2022-12-10 ENCOUNTER — Encounter: Payer: Self-pay | Admitting: Internal Medicine

## 2022-12-10 ENCOUNTER — Ambulatory Visit: Payer: PPO | Admitting: Internal Medicine

## 2022-12-10 VITALS — BP 140/78 | HR 68 | Temp 97.2°F | Resp 16 | Ht 76.0 in | Wt 177.4 lb

## 2022-12-10 DIAGNOSIS — I10 Essential (primary) hypertension: Secondary | ICD-10-CM | POA: Diagnosis not present

## 2022-12-10 DIAGNOSIS — I679 Cerebrovascular disease, unspecified: Secondary | ICD-10-CM

## 2022-12-10 DIAGNOSIS — Z1211 Encounter for screening for malignant neoplasm of colon: Secondary | ICD-10-CM

## 2022-12-10 DIAGNOSIS — Z87891 Personal history of nicotine dependence: Secondary | ICD-10-CM

## 2022-12-10 DIAGNOSIS — E785 Hyperlipidemia, unspecified: Secondary | ICD-10-CM | POA: Diagnosis not present

## 2022-12-10 DIAGNOSIS — N200 Calculus of kidney: Secondary | ICD-10-CM | POA: Insufficient documentation

## 2022-12-10 DIAGNOSIS — F17210 Nicotine dependence, cigarettes, uncomplicated: Secondary | ICD-10-CM | POA: Diagnosis not present

## 2022-12-10 DIAGNOSIS — Z6821 Body mass index (BMI) 21.0-21.9, adult: Secondary | ICD-10-CM | POA: Insufficient documentation

## 2022-12-10 DIAGNOSIS — Z9889 Other specified postprocedural states: Secondary | ICD-10-CM | POA: Diagnosis not present

## 2022-12-10 DIAGNOSIS — E1159 Type 2 diabetes mellitus with other circulatory complications: Secondary | ICD-10-CM | POA: Diagnosis not present

## 2022-12-10 DIAGNOSIS — G8110 Spastic hemiplegia affecting unspecified side: Secondary | ICD-10-CM | POA: Diagnosis not present

## 2022-12-10 DIAGNOSIS — I2510 Atherosclerotic heart disease of native coronary artery without angina pectoris: Secondary | ICD-10-CM | POA: Diagnosis not present

## 2022-12-10 DIAGNOSIS — Z0000 Encounter for general adult medical examination without abnormal findings: Secondary | ICD-10-CM | POA: Diagnosis not present

## 2022-12-10 DIAGNOSIS — N400 Enlarged prostate without lower urinary tract symptoms: Secondary | ICD-10-CM | POA: Diagnosis not present

## 2022-12-10 NOTE — Assessment & Plan Note (Signed)
We will continue to monitor this.

## 2022-12-10 NOTE — Assessment & Plan Note (Signed)
He had a stroke in 2021.  We will continue with secondary prevention.  He remains on ASA and plavix.

## 2022-12-10 NOTE — Assessment & Plan Note (Signed)
I reviewed his notes from neurology.  He remains on methocarbamol and baclofen which help.  He is receiving botox injection for his spastic hemiparesis.

## 2022-12-10 NOTE — Assessment & Plan Note (Signed)
He is not having any symptoms.  We will check a PSA today.

## 2022-12-10 NOTE — Assessment & Plan Note (Signed)
I want him to eat healthy, exercise as he can and be active.

## 2022-12-10 NOTE — Assessment & Plan Note (Signed)
His goal LDL <55 with his history of diabetes, stroke and CAD.

## 2022-12-10 NOTE — Progress Notes (Signed)
Preventive Screening-Counseling & Management     Jacob Short is a 77 y.o. male who presents for Medicare Annual/Subsequent preventive examination.  Jacob Short is a 77 year old Caucasian/White male who presents for his annual wellness exam. This patient's past medical history Benign Prostatic Hypertrophy, CAD, Cerebrovascular Disease, Diabetes Mellitus, Type II, ED, Hyperlipidemia, Hypertension, Kidney Stones, and Tobacco Abuse.   He had a dilated eye exam in 05/09/2021 where he did not have diabetic retinopathy but he has a cataract and they are following him for macular degeneration.  His last colonoscopy was >10 years ago and was normal (done in [place]). We referred him for colonoscopy in 2022 and he declined it.  I did a cologuard on 12/07/2021 and it was positive.  We referred him to GI with Dr. Lyda Jester but he states he did not go for this appointment.  He has a history of BPH with slightly enlarged prostate but no nodules were found. He denies any problems with urination. The patient is exercising doing his exercises therapy taught him after his stroke.  The patient does get yearly flu vaccines. He has had a pneumovax 23 and shingles vaccine was done about 2016. He had his prevnar 13 vaccine in 2017. He is not interested in the RSV vaccine.  He has had 2 COVID-19 vaccines but no boosters. The patient denies any depression, anxiety, or memory loss. He is on an ASA and plavix.   Jacob Short is a 77 yo gentleman who has moderately severe CAD of about 10 years known duration and returns for followup.  He is followed by Dr. Bettina Gavia with his last visit in 11/26/2022.  He felt that his CAD was stable and they did arrange to have a carotid doppler done.  He did have a carotid doppler US done in 12/09/2022 which showed no evidence of stenosis in the right ICA or left ICA.  The patient was diagnosed with CAD in 2003 where he was having chest pain and was sent to [facility] where they did cardiac  cath and found he had 2 lesions.  He had drug eluting stents placed in the proximal and mid RCA at that time.  He remains on ASA and plavix.  His previous heart catherization was around 2003 where he had one stent placed. He has never had a MI. The patient did see cardiology in 07/2011 where they did a stress test and this was positive for anterior wall ischemia with an EF 51%. He went to [facility] for an elective hearth catherization.  He tells me they found mild blockage at that time but he did not need intervention and they decided to go with medical management.  His CAD is controlled with therapy as summarized in the medication list and previous notes.  The patient has no comorbid conditions. He has no baseline symptoms of CAD. He has the following modifiable risk factor(s): smoking, HTN, DM, and hyperlipidemia.  Specifically denied complaint(s): chest pain, palpitations, orthopnea, edema, exertional dyspnea, and syncope.    The patient is a 77 year old Caucasian/White male who returns for a follow-up visit for his T2 diabetes.  Since his last visit, he states he has not had any problems.  He is no longer on Trulicity as he ran in his donut hole.  He remains on metformin 2000 mg daily dose and  farxiga 5mg  daily.  He specifically denies chest pain, shortness of breath, unexplained fatigue, unexplained abdominal pain, nausea or vomiting, and documented  hypoglycemia. He is not regularly checking his FSBS.  He came in fasting today in anticipation of lab work. His last HgbA1c was done 6 months ago and was 6.9%.  He does not have a history of diabetic retinopathy, neuropathy or nephropathy noted.  He does have a history of CAD with stent placement as described above.  He has not had a dilated eye exam since 2022.  The patient is a 77 year old Caucasian/White male who presents for a follow-up evaluation of hypertension.  This past year, his BP was controlled and we increased his losartan from 25mg  daily  to 50mg  daily.  The patient has not been checking his blood pressure at home. The patient's current medications include: carvedilol 12.5 mg once per day and losaratan 50mg  daily. The patient has been tolerating his medications well. The patient denies any dizziness, lightheadness, chest pain, shortness of breath, weakness/numbness, and edema. His last eye exam was 9 months ago. He reports there have been no other symptoms noted.  Jacob Lange. Span returns today for routine followup on his cholesterol. In 2020, we changed his lipitor to crestor. Overall, he states he is doing well and is without any complaints or problems at this time. He specifically denies abdominal pain, nausea, vomiting, diarrhea, myalgias, and fatigue. He remains on dietary management as well as the following cholesterol lowering medications Crestor 10 mg qhs. He is fasting in anticipation for labs today.   Jacob Short is a 77 yo gentleman who also has a history of cerebrovascular disease where he had a stroke in 05/2020.  He was hospitalized at that time where his workup in the hospital demonstrated that he had a left corona radiata infarct secondary to small vessel disease.  They placed him on a statin as well as ASA 325mg  daily and Plavix 75mg  daily.  The patient has noted that his right sided weakness/numbness where he has seen neurology as well as therapies.  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  He denies any residual sequalae and denies any vision problems, cognition/memory problems, or speech problems.  Jacob Short has had problems with stability with walking and problems with agility.  Unfortunately in 05/2021 he developed pyelonephritis where he was hospitalized.  Before this, he was doing well with his therapies but his weakness worsened with lying in bed while in the hospital.  Again, he was able to lie in bed before he was admitted and he walking on his own but he did use a cane in  public. He then had to start using a rolling walker and he has noticed his walking ability has significantly been effected.  He was no longer able to get into and out of bed and has been sleeping in a recliner.  He has had stiffness of his right arm which is sequalae of his stroke where he has right arm spastic hemiplegia. He also saw Dr. Posey Pronto in [facility] who started him on a course of baclofen.  He received therapy for his arm/shoulder and [facility] was doing botox injections every 12 weeks for a frozen shoulder.  His last Botox injection was in 10/2022.  He has been receiving botox injections for right spastic hemiparesis.  He continues Baclofen 5 mg BID and Robaxin 750 mg 2-3 times per day. The Robaxin helps right leg and arm muscle spasms at night.  He ambulates with a cane when outdoors and does not use an assistive device in  the home. In 2022, he participated in transcranial direct current stimulation for post-stroke motor recovery study and he had a good response.   Jacob Short was having problems with kidney stones in 08/2016.  I sent him for a CT urogram where we discovered incidentally that he had an AAA.  He had an infrarenal AAA measuring up to 5cm.  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  He went to vascular surgery for repair which was done in 2018.  He was referred to cardiology for nuclear stress testing and he states this was normal. He has had lithotripsy for his kidney stones in the past.  Again, he was admitted as above on 05/2021 where he was diagnosed with obstructing ureteral stones.  He was placed on IV antibiotics and underwent a cystoscopy on 9/15 where they removed stones.  He was noted to have some mild hydronephrosis.  He has not had any other kidney stones since then.    The patient used to be on cialis for ED but has not taken this in a while.   Again, he has a history of tobacco and smokes less than a ppd.  He did smoke about 1 ppd for at least 30 years.  He quit on 06/15/2020 on the day of his stroke but he restarted smoking about 1 year ago where he is smoking 1/2 ppd.      Are there smokers in your home (other than you)? Yes  Risk Factors Current exercise habits:  as above   Dietary issues discussed: none   Depression Screen (Note: if answer to either of the following is "Yes", a more complete depression screening is indicated)   Over the past two weeks, have you felt down, depressed or hopeless? No  Over the past two weeks, have you felt little interest or pleasure in doing things? No  Have you lost interest or pleasure in daily life? No  Do you often feel hopeless? No  Do you cry easily over simple problems? No  Activities of Daily Living In your present state of health, do you have any difficulty performing the following activities?:  Driving? Yes- he no longer drives Managing money?  no Feeding yourself? no Getting from bed to chair? no Climbing a flight of stairs?  no Preparing food and eating?: no Bathing or showering? no Getting dressed: No Getting to the toilet? No Using the toilet:No Moving around from place to place: No In the past year have you fallen or had a near fall?:No   Are you sexually active?  Yes  Do you have more than one partner?  No  Hearing Difficulties: No- wears hearing aids Do you often ask people to speak up or repeat themselves? No Do you experience ringing or noises in your ears? No Do you have difficulty understanding soft or whispered voices? No   Do you feel that you have a problem with memory? No  Do you often misplace items? No  Do you feel safe at home?  Yes  Cognitive Testing  Alert? Yes  Normal Appearance?Yes  Oriented to person? Yes  Place? Yes   Time? Yes  Recall of three objects?  Yes  Can perform simple calculations? Yes  Displays appropriate judgment?Yes  Can  read the correct time from a watch face?Yes  Fall Risk Prevention  Any stairs  in or around the home? Yes  If so, are there any without handrails? Yes  Home free of loose throw rugs in walkways, pet beds, electrical cords, etc? No  Adequate lighting in your home to reduce risk of falls? Yes  Use of a cane, walker or w/c? Yes    Time Up and Go  Was the test performed? Yes .  Length of time to ambulate 10 feet: 15 sec.   Gait slow and steady with assistive device    Advanced Directives have been discussed with the patient? Yes   List the Names of Other Physician/Practitioners you currently use: Patient Care Team: Townsend Roger, MD as PCP - General (Internal Medicine)    Past Medical History:  Diagnosis Date   BPH (benign prostatic hyperplasia)    Cerebrovascular disease    Coronary artery disease    History of kidney stones    Hyperlipidemia    Hypertension    Peripheral vascular disease ([place])    T2DM (type 2 diabetes mellitus) ([place])    Tobacco abuse     Past Surgical History:  Procedure Laterality Date   CORONARY ANGIOPLASTY        Current Medications  Current Outpatient Medications  Medication Sig Dispense Refill   aspirin EC 325 MG tablet Take 1 tablet (325 mg total) by mouth daily. 30 tablet 0   Baclofen 5 MG TABS Take 5 mg by mouth in the morning, at noon, and at bedtime. 90 tablet 3   carvedilol (COREG) 12.5 MG tablet Take 1 tablet (12.5 mg total) by mouth 2 (two) times daily with a meal. 120 tablet 1   Cholecalciferol (VITAMIN D) 50 MCG (2000 UT) tablet Take 2,000 Units by mouth daily.     clopidogrel (PLAVIX) 75 MG tablet Take 1 tablet (75 mg total) by mouth daily. 30 tablet 5   dapagliflozin propanediol (FARXIGA) 10 MG TABS tablet Take 5 mg by mouth daily.     losartan (COZAAR) 50 MG tablet TAKE 1 TABLET(50 MG) BY MOUTH DAILY 90 tablet 0   metFORMIN (GLUCOPHAGE-XR) 500 MG 24 hr tablet Take 2 tablets (1,000 mg total) by mouth 2 (two) times daily with a  meal. 270 tablet 1   methocarbamol (ROBAXIN) 500 MG tablet TAKE 1 TABLET(500 MG) BY MOUTH EVERY 6 HOURS AS NEEDED FOR MUSCLE SPASMS 90 tablet 1   nitroGLYCERIN (NITROSTAT) 0.4 MG SL tablet Place under the tongue.     rosuvastatin (CRESTOR) 10 MG tablet Take 1 tablet (10 mg total) by mouth daily. 90 tablet 1   No current facility-administered medications for this visit.    Allergies Lisinopril   Social History Social History   Tobacco Use   Smoking status: Former    Types: Cigarettes    Quit date: 06/15/2020    Years since quitting: 2.4   Smokeless tobacco: Current  Substance Use Topics   Alcohol use: Not Currently     Review of Systems Review of Systems  Constitutional:  Negative for chills, fever, malaise/fatigue and weight loss.  HENT:  Negative for hearing loss and tinnitus.   Eyes:  Negative for blurred vision and double vision.  Respiratory:  Negative for cough, sputum production and shortness of breath.   Cardiovascular:  Negative for chest pain, palpitations and leg swelling.  Gastrointestinal:  Negative for abdominal pain, blood in stool, constipation, diarrhea, melena, nausea and vomiting.  Genitourinary:  Negative for frequency and hematuria.  Musculoskeletal:  Negative for myalgias.  Skin:  Negative for itching  and rash.  Neurological:  Negative for dizziness, seizures and headaches.  Psychiatric/Behavioral:  Negative for depression. The patient is not nervous/anxious.      Physical Exam:      Body mass index is 21.59 kg/m. BP (!) 140/78   Pulse 68   Temp (!) 97.2 F (36.2 C)   Resp 16   Ht 6\' 4"  (1.93 m)   Wt 177 lb 6.4 oz (80.5 kg)   SpO2 98%   BMI 21.59 kg/m   Physical Exam Constitutional:      Appearance: Normal appearance. He is not ill-appearing.  HENT:     Head: Normocephalic and atraumatic.     Right Ear: Tympanic membrane, ear canal and external ear normal.     Left Ear: Tympanic membrane, ear canal and external ear normal.     Nose:  Nose normal. No congestion or rhinorrhea.     Mouth/Throat:     Mouth: Mucous membranes are dry.     Pharynx: Oropharynx is clear. No oropharyngeal exudate or posterior oropharyngeal erythema.  Eyes:     General: No scleral icterus.    Conjunctiva/sclera: Conjunctivae normal.     Pupils: Pupils are equal, round, and reactive to light.  Neck:     Vascular: No carotid bruit.  Cardiovascular:     Rate and Rhythm: Normal rate and regular rhythm.     Pulses: Normal pulses.     Heart sounds: No murmur heard.    No friction rub. No gallop.  Pulmonary:     Effort: Pulmonary effort is normal. No respiratory distress.     Breath sounds: No wheezing, rhonchi or rales.  Abdominal:     General: Abdomen is flat. Bowel sounds are normal. There is no distension.     Palpations: Abdomen is soft.     Tenderness: There is no abdominal tenderness.  Musculoskeletal:     Cervical back: Neck supple. No tenderness.     Right lower leg: No edema.     Left lower leg: No edema.  Lymphadenopathy:     Cervical: No cervical adenopathy.  Skin:    General: Skin is warm and dry.     Findings: No rash.  Neurological:     General: No focal deficit present.     Mental Status: He is alert and oriented to person, place, and time.  Psychiatric:        Mood and Affect: Mood normal.        Behavior: Behavior normal.      Assessment:      Cerebrovascular disease  Spastic hemiparesis affecting dominant side (HCC)  Type 2 diabetes mellitus with other circulatory complications (HCC)  Coronary artery disease involving native coronary artery of native heart without angina pectoris  Essential hypertension  Hyperlipidemia, unspecified hyperlipidemia type  Benign prostatic hyperplasia without lower urinary tract symptoms  History of AAA (abdominal aortic aneurysm) repair  Nephrolithiasis  Cigarette smoker  BMI 21.0-21.9, adult  Encounter for Hemoccult screening     Plan:     During the course  of the visit the patient was educated and counseled about appropriate screening and preventive services including:   Pneumococcal vaccine  Influenza vaccine Colorectal cancer screening Smoking cessation counseling  Diet review for nutrition referral? Yes ____  Not Indicated _X___   Patient Instructions (the written plan) was given to the patient.  Type 2 diabetes mellitus with other circulatory complications [facility]) He has diabetes associated with CAD.  His goal HgBa1c is <7%.  We  will check his urine studies today and his diabetic exam was normal.  He has fungal toenails but I do not recommend treatment with antifungals at this time.  Essential hypertension His BP is borderline.  We will see what his BP is doing on his next visit.  Cerebrovascular disease He had a stroke in 2021.  We will continue with secondary prevention.  He remains on ASA and plavix.  Spastic hemiparesis affecting dominant side ([place]) I reviewed his notes from neurology.  He remains on methocarbamol and baclofen which help.  He is receiving botox injection for his spastic hemiparesis.  Nephrolithiasis We will continue to monitor this.  BPH (benign prostatic hyperplasia) He is not having any symptoms.  We will check a PSA today.  Cigarette smoker I discussed tobacco cessation and his risk of MI and stroke.  I have asked him to quit.  Hyperlipidemia His goal LDL <55 with his history of diabetes, stroke and CAD.  BMI 21.0-21.9, adult I want him to eat healthy, exercise as he can and be active.   Prevention Health maintenance was discussed.  He had a positive cologuard last year but did not go to his GI appointment. We are going to refer him back to GI at this time.  We will obtain some yearly labs.   Medicare Attestation I have personally reviewed: The patient's medical and social history Their use of alcohol, tobacco or illicit drugs Their current medications and supplements The patient's functional  ability including ADLs,fall risks, home safety risks, cognitive, and hearing and visual impairment Diet and physical activities Evidence for depression or mood disorders  The patient's weight, height, and BMI have been recorded in the chart.  I have made referrals, counseling, and provided education to the patient based on review of the above and I have provided the patient with a written personalized care plan for preventive services.     Townsend Roger, MD   12/10/2022

## 2022-12-10 NOTE — Assessment & Plan Note (Signed)
He has diabetes associated with CAD.  His goal HgBa1c is <7%.  We will check his urine studies today and his diabetic exam was normal.  He has fungal toenails but I do not recommend treatment with antifungals at this time.

## 2022-12-10 NOTE — Assessment & Plan Note (Signed)
I discussed tobacco cessation and his risk of MI and stroke.  I have asked him to quit.

## 2022-12-10 NOTE — Assessment & Plan Note (Signed)
His BP is borderline.  We will see what his BP is doing on his next visit.

## 2022-12-12 LAB — CMP14 + ANION GAP
ALT: 10 IU/L (ref 0–44)
AST: 8 IU/L (ref 0–40)
Albumin/Globulin Ratio: 1.6 (ref 1.2–2.2)
Albumin: 4.1 g/dL (ref 3.8–4.8)
Alkaline Phosphatase: 107 IU/L (ref 44–121)
Anion Gap: 17 mmol/L (ref 10.0–18.0)
BUN/Creatinine Ratio: 16 (ref 10–24)
BUN: 12 mg/dL (ref 8–27)
Bilirubin Total: 0.8 mg/dL (ref 0.0–1.2)
CO2: 21 mmol/L (ref 20–29)
Calcium: 9 mg/dL (ref 8.6–10.2)
Chloride: 104 mmol/L (ref 96–106)
Creatinine, Ser: 0.75 mg/dL — ABNORMAL LOW (ref 0.76–1.27)
Globulin, Total: 2.5 g/dL (ref 1.5–4.5)
Glucose: 114 mg/dL — ABNORMAL HIGH (ref 70–99)
Potassium: 5.1 mmol/L (ref 3.5–5.2)
Sodium: 142 mmol/L (ref 134–144)
Total Protein: 6.6 g/dL (ref 6.0–8.5)
eGFR: 93 mL/min/{1.73_m2} (ref >59)

## 2022-12-12 LAB — CBC WITH DIFFERENTIAL/PLATELET
Basophils Absolute: 0 10*3/uL (ref 0.0–0.2)
Basos: 0 %
EOS (ABSOLUTE): 0.2 10*3/uL (ref 0.0–0.4)
Eos: 3 %
Hematocrit: 43.4 % (ref 37.5–51.0)
Hemoglobin: 13.3 g/dL (ref 13.0–17.7)
Immature Grans (Abs): 0 10*3/uL (ref 0.0–0.1)
Immature Granulocytes: 0 %
Lymphocytes Absolute: 1.8 10*3/uL (ref 0.7–3.1)
Lymphs: 20 %
MCH: 27.9 pg (ref 26.6–33.0)
MCHC: 30.6 g/dL — ABNORMAL LOW (ref 31.5–35.7)
MCV: 91 fL (ref 79–97)
Monocytes Absolute: 0.9 10*3/uL (ref 0.1–0.9)
Monocytes: 9 %
Neutrophils Absolute: 6.3 10*3/uL (ref 1.4–7.0)
Neutrophils: 68 %
Platelets: 269 10*3/uL (ref 150–450)
RBC: 4.76 x10E6/uL (ref 4.14–5.80)
RDW: 15.6 % — ABNORMAL HIGH (ref 11.6–15.4)
WBC: 9.2 10*3/uL (ref 3.4–10.8)

## 2022-12-12 LAB — MICROALBUMIN / CREATININE URINE RATIO
Creatinine, Urine: 30.3 mg/dL
Microalb/Creat Ratio: 42 mg/g creat — ABNORMAL HIGH (ref 0–29)
Microalbumin, Urine: 12.7 ug/mL

## 2022-12-12 LAB — PSA: Prostate Specific Ag, Serum: 0.8 ng/mL (ref 0.0–4.0)

## 2022-12-12 LAB — LIPID PANEL
Chol/HDL Ratio: 3.2 ratio (ref 0.0–5.0)
Cholesterol, Total: 101 mg/dL (ref 100–199)
HDL: 32 mg/dL — ABNORMAL LOW (ref >39)
LDL Chol Calc (NIH): 47 mg/dL (ref 0–99)
Triglycerides: 120 mg/dL (ref 0–149)
VLDL Cholesterol Cal: 22 mg/dL (ref 5–40)

## 2022-12-12 LAB — HEMOGLOBIN A1C
Est. average glucose Bld gHb Est-mCnc: 154 mg/dL
Hgb A1c MFr Bld: 7 % — ABNORMAL HIGH (ref 4.8–5.6)

## 2022-12-12 LAB — TSH: TSH: 2.8 u[IU]/mL (ref 0.450–4.500)

## 2022-12-26 ENCOUNTER — Telehealth: Payer: Self-pay

## 2022-12-26 NOTE — Telephone Encounter (Signed)
-----   Message from Townsend Roger, MD sent at 12/25/2022 10:49 AM EDT ----- His labs look good.

## 2022-12-26 NOTE — Telephone Encounter (Signed)
Pt notified of lab results

## 2022-12-30 ENCOUNTER — Other Ambulatory Visit: Payer: Self-pay

## 2022-12-30 MED ORDER — CARVEDILOL 12.5 MG PO TABS: 12.5000 mg | ORAL_TABLET | Freq: Two times a day (BID) | ORAL | 1 refills | Status: DC

## 2022-12-30 MED ORDER — METFORMIN HCL ER 500 MG PO TB24: 1000.0000 mg | ORAL_TABLET | Freq: Two times a day (BID) | ORAL | 1 refills | Status: DC

## 2022-12-30 MED ORDER — CLOPIDOGREL BISULFATE 75 MG PO TABS: 75.0000 mg | ORAL_TABLET | Freq: Every day | ORAL | 5 refills | Status: DC

## 2023-01-27 ENCOUNTER — Ambulatory Visit: Payer: PPO | Admitting: Internal Medicine

## 2023-02-02 ENCOUNTER — Other Ambulatory Visit: Payer: Self-pay | Admitting: Internal Medicine

## 2023-02-10 ENCOUNTER — Encounter: Payer: Self-pay | Admitting: Internal Medicine

## 2023-02-10 ENCOUNTER — Ambulatory Visit: Payer: PPO | Admitting: Internal Medicine

## 2023-02-10 VITALS — BP 120/70 | HR 89 | Temp 98.2°F | Resp 16 | Ht 76.0 in | Wt 177.6 lb

## 2023-02-10 DIAGNOSIS — I69351 Hemiplegia and hemiparesis following cerebral infarction affecting right dominant side: Secondary | ICD-10-CM | POA: Diagnosis not present

## 2023-02-10 NOTE — Assessment & Plan Note (Signed)
I am going to write him for a wheelchair.  His mobility limitation cannot be sufficiently resolved by the use of an appropriately lifted cane or walker.  The use of a wheelchair will significantly improve the patient's abliity to participate in MRADL's and he has a caregiver who is available, willing and able to provide assistance with the wheelchair.

## 2023-02-10 NOTE — Progress Notes (Signed)
Office Visit  Subjective   Patient ID: DEX GANSCHOW   DOB: 1946-04-12   Age: 77 y.o.   MRN: [id number]   Chief Complaint Chief Complaint  Patient presents with   Follow-up    Wants wheelchair     History of Present Illness Mr. Cindra Eves is a 77 yo gentleman who comes in requesting a wheelchair.  He has has a history of cerebrovascular disease where he had a stroke in 05/2020.  He was hospitalized at that time where his workup in the hospital demonstrated that he had a left corona radiata infarct secondary to small vessel disease.  They placed him on a statin as well as ASA 325mg  daily and Plavix 75mg  daily.  The patient has noted that his right sided weakness/numbness where he has seen neurology as well as therapies.  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  He denies any residual sequalae and denies any vision problems, cognition/memory problems, or speech problems.  Mr. Fromm has had problems with stability with walking and problems with agility.  Unfortunately in 05/2021 he developed pyelonephritis where he was hospitalized.  Before this, he was doing well with his therapies but his weakness worsened with lying in bed while in the hospital.  Again, he was able to lie in bed before he was admitted and he walking on his own but he did use a cane in public. He then had to start using a rolling walker and he has noticed his walking ability has significantly been effected.  He was no longer able to get into and out of bed and has been sleeping in a recliner.  He has had stiffness of his right arm which is sequalae of his stroke where he has right arm spastic hemiplegia. He also saw Dr. Allena Katz in PM&R who started him on a course of baclofen.  He received therapy for his arm/shoulder and [facility] was doing botox injections every 12 weeks for a frozen shoulder.  His last Botox injection was in 10/2022.  He has been receiving botox injections for right spastic  hemiparesis.  He continues Baclofen 5 mg BID and Robaxin 750 mg 2-3 times per day. The Robaxin helps right leg and arm muscle spasms at night.  He ambulates with a cane for short distances in both the indoors and outdoors.   In 2022, he participated in transcranial direct current stimulation for post-stroke motor recovery study and he had a good response.  He states when they go to the store or vacation, he cannot travel long distances due to his right spastic hemiplegia.       Past Medical History Past Medical History:  Diagnosis Date   BPH (benign prostatic hyperplasia)    Cerebrovascular disease    Coronary artery disease    History of kidney stones    Hyperlipidemia    Hypertension    Peripheral vascular disease (HCC)    T2DM (type 2 diabetes mellitus) (HCC)    Tobacco abuse      Allergies Allergies  Allergen Reactions   Lisinopril Swelling     Medications  Current Outpatient Medications:    aspirin EC 325 MG tablet, Take 1 tablet (325 mg total) by mouth daily., Disp: 30 tablet, Rfl: 0   Baclofen 5 MG TABS, Take 5 mg by mouth in the morning, at noon, and at bedtime., Disp: 90 tablet, Rfl: 3   carvedilol (COREG) 12.5 MG tablet, Take 1 tablet (  12.5 mg total) by mouth 2 (two) times daily with a meal., Disp: 120 tablet, Rfl: 1   Cholecalciferol (VITAMIN D) 50 MCG (2000 UT) tablet, Take 2,000 Units by mouth daily., Disp: , Rfl:    clopidogrel (PLAVIX) 75 MG tablet, Take 1 tablet (75 mg total) by mouth daily., Disp: 30 tablet, Rfl: 5   dapagliflozin propanediol (FARXIGA) 10 MG TABS tablet, Take 5 mg by mouth daily., Disp: , Rfl:    losartan (COZAAR) 50 MG tablet, TAKE 1 TABLET(50 MG) BY MOUTH DAILY, Disp: 90 tablet, Rfl: 0   metFORMIN (GLUCOPHAGE-XR) 500 MG 24 hr tablet, Take 2 tablets (1,000 mg total) by mouth 2 (two) times daily with a meal., Disp: 270 tablet, Rfl: 1   methocarbamol (ROBAXIN) 500 MG tablet, TAKE 1 TABLET(500 MG) BY MOUTH EVERY 6 HOURS AS NEEDED FOR MUSCLE SPASMS,  Disp: 270 tablet, Rfl: 1   nitroGLYCERIN (NITROSTAT) 0.4 MG SL tablet, Place under the tongue., Disp: , Rfl:    rosuvastatin (CRESTOR) 10 MG tablet, Take 1 tablet (10 mg total) by mouth daily., Disp: 90 tablet, Rfl: 1   Review of Systems Review of Systems  Constitutional:  Negative for chills and fever.  Eyes:  Negative for blurred vision and double vision.  Respiratory:  Negative for cough and shortness of breath.   Cardiovascular:  Negative for chest pain, palpitations and leg swelling.  Gastrointestinal:  Negative for abdominal pain, constipation, diarrhea, nausea and vomiting.  Musculoskeletal:  Negative for myalgias.  Neurological:  Negative for dizziness, weakness and headaches.       No new weakness       Objective:    Vitals BP 120/70   Pulse 89   Temp 98.2 F (36.8 C)   Resp 16   Ht 6\' 4"  (1.93 m)   Wt 177 lb 9.6 oz (80.6 kg)   SpO2 96%   BMI 21.62 kg/m    Physical Examination Physical Exam Constitutional:      Appearance: Normal appearance. He is not ill-appearing.  Cardiovascular:     Rate and Rhythm: Normal rate and regular rhythm.     Pulses: Normal pulses.     Heart sounds: No murmur heard.    No friction rub. No gallop.  Pulmonary:     Effort: Pulmonary effort is normal. No respiratory distress.     Breath sounds: No wheezing, rhonchi or rales.  Abdominal:     General: Abdomen is flat. Bowel sounds are normal. There is no distension.     Palpations: Abdomen is soft.     Tenderness: There is no abdominal tenderness.  Musculoskeletal:     Right lower leg: No edema.     Left lower leg: No edema.  Skin:    General: Skin is warm and dry.     Findings: No rash.  Neurological:     Mental Status: He is alert.     Comments: Right sided hemiplegia unchanged.        Assessment & Plan:   Spastic hemiplegia of right dominant side as late effect of cerebral infarction (HCC) I am going to write him for a wheelchair.  His mobility limitation cannot be  sufficiently resolved by the use of an appropriately lifted cane or walker.  The use of a wheelchair will significantly improve the patient's abliity to participate in MRADL's and he has a caregiver who is available, willing and able to provide assistance with the wheelchair.      No follow-ups on file.  Crist Fat, MD

## 2023-02-12 DIAGNOSIS — G8191 Hemiplegia, unspecified affecting right dominant side: Secondary | ICD-10-CM | POA: Diagnosis not present

## 2023-02-21 DIAGNOSIS — I10 Essential (primary) hypertension: Secondary | ICD-10-CM | POA: Diagnosis not present

## 2023-02-21 DIAGNOSIS — Z6821 Body mass index (BMI) 21.0-21.9, adult: Secondary | ICD-10-CM | POA: Diagnosis not present

## 2023-02-21 DIAGNOSIS — Z7984 Long term (current) use of oral hypoglycemic drugs: Secondary | ICD-10-CM | POA: Diagnosis not present

## 2023-02-21 DIAGNOSIS — E785 Hyperlipidemia, unspecified: Secondary | ICD-10-CM | POA: Diagnosis not present

## 2023-02-21 DIAGNOSIS — Z7985 Long-term (current) use of injectable non-insulin antidiabetic drugs: Secondary | ICD-10-CM | POA: Diagnosis not present

## 2023-02-21 DIAGNOSIS — E119 Type 2 diabetes mellitus without complications: Secondary | ICD-10-CM | POA: Diagnosis not present

## 2023-02-21 DIAGNOSIS — I69351 Hemiplegia and hemiparesis following cerebral infarction affecting right dominant side: Secondary | ICD-10-CM | POA: Diagnosis not present

## 2023-02-27 ENCOUNTER — Other Ambulatory Visit: Payer: Self-pay | Admitting: Internal Medicine

## 2023-03-10 ENCOUNTER — Encounter: Payer: Self-pay | Admitting: Internal Medicine

## 2023-03-10 ENCOUNTER — Ambulatory Visit: Payer: PPO | Admitting: Internal Medicine

## 2023-03-10 VITALS — BP 130/70 | HR 74 | Temp 97.4°F | Resp 16 | Ht 76.0 in | Wt 178.8 lb

## 2023-03-10 DIAGNOSIS — R809 Proteinuria, unspecified: Secondary | ICD-10-CM

## 2023-03-10 DIAGNOSIS — E1159 Type 2 diabetes mellitus with other circulatory complications: Secondary | ICD-10-CM

## 2023-03-10 DIAGNOSIS — E7800 Pure hypercholesterolemia, unspecified: Secondary | ICD-10-CM | POA: Diagnosis not present

## 2023-03-10 DIAGNOSIS — I10 Essential (primary) hypertension: Secondary | ICD-10-CM | POA: Diagnosis not present

## 2023-03-10 DIAGNOSIS — E1129 Type 2 diabetes mellitus with other diabetic kidney complication: Secondary | ICD-10-CM

## 2023-03-10 NOTE — Assessment & Plan Note (Signed)
We will check his cholesterol on his next visit.  His cholesterol has been stable and controlled with his history of DM and CAD.  Goal is to have LDL <55.

## 2023-03-10 NOTE — Assessment & Plan Note (Signed)
He has minimal protein in his urine.  I advised him against NSAIDS.  He is on farxiga and losartan.

## 2023-03-10 NOTE — Assessment & Plan Note (Signed)
He has diabetes associated with CAD and cerebrovascular disease.  Our goal will be to keep his A1c <7%.

## 2023-03-10 NOTE — Progress Notes (Signed)
Office Visit  Subjective   Patient ID: Jacob Short   DOB: 29-May-1946   Age: 77 y.o.   MRN: [id number]   Chief Complaint Chief Complaint  Patient presents with   Follow-up     History of Present Illness The patient is a 77 year old Caucasian/White male who returns for a follow-up visit for his T2 diabetes.  Since his last visit, he states he has not had any problems.  He is no longer on Trulicity as he ran in his donut hole.  He remains on metformin 2000 mg daily dose and farxiga 5mg  daily.  He specifically denies chest pain, shortness of breath, unexplained fatigue, unexplained abdominal pain, nausea or vomiting, and documented hypoglycemia. He is not regularly checking his FSBS.  He came in fasting today in anticipation of lab work. His last HgbA1c was done 3 months ago and was 7%.  He does not have a history of diabetic retinopathy, neuropathy or nephropathy noted.  He does have a history of CAD with stent placement as described above.  He had some minor microalbuminuria 3 months ago but again he is on farxiga and an ARB.   He has not had a dilated eye exam since 2022.  He is scheduled to see Dr. Precious Bard in 06/2023 for his yearly eye exam.   The patient is a 77 year old Caucasian/White male who presents for a follow-up evaluation of hypertension.  This past year, his BP was controlled and we increased his losartan from 25mg  daily to 50mg  daily.  The patient has not been checking his blood pressure at home. The patient's current medications include: carvedilol 12.5 mg once per day and losaratan 50mg  daily. The patient has been tolerating his medications well. The patient denies any dizziness, lightheadness, chest pain, shortness of breath, weakness/numbness, and edema. His last eye exam was 9 months ago. He reports there have been no other symptoms noted.   Jacob Short returns today for routine followup on his cholesterol. In 2020, we changed his lipitor to crestor. Overall, he states  he is doing well and is without any complaints or problems at this time. On his last visit, his LDL was 47.  He specifically denies abdominal pain, nausea, vomiting, diarrhea, myalgias, and fatigue. He remains on dietary management as well as the following cholesterol lowering medications Crestor 10 mg qhs. He is fasting in anticipation for labs today.       Past Medical History Past Medical History:  Diagnosis Date   BPH (benign prostatic hyperplasia)    Cerebrovascular disease    Coronary artery disease    History of kidney stones    Hyperlipidemia    Hypertension    Peripheral vascular disease (HCC)    T2DM (type 2 diabetes mellitus) (HCC)    Tobacco abuse      Allergies Allergies  Allergen Reactions   Lisinopril Swelling     Medications  Current Outpatient Medications:    aspirin EC 325 MG tablet, Take 1 tablet (325 mg total) by mouth daily., Disp: 30 tablet, Rfl: 0   Baclofen 5 MG TABS, Take 5 mg by mouth in the morning, at noon, and at bedtime., Disp: 90 tablet, Rfl: 3   carvedilol (COREG) 12.5 MG tablet, Take 1 tablet (12.5 mg total) by mouth 2 (two) times daily with a meal., Disp: 120 tablet, Rfl: 1   Cholecalciferol (VITAMIN D) 50 MCG (2000 UT) tablet, Take 2,000 Units by mouth daily., Disp: , Rfl:  clopidogrel (PLAVIX) 75 MG tablet, Take 1 tablet (75 mg total) by mouth daily., Disp: 30 tablet, Rfl: 5   dapagliflozin propanediol (FARXIGA) 10 MG TABS tablet, Take 5 mg by mouth daily., Disp: , Rfl:    losartan (COZAAR) 50 MG tablet, TAKE 1 TABLET(50 MG) BY MOUTH DAILY, Disp: 90 tablet, Rfl: 0   metFORMIN (GLUCOPHAGE-XR) 500 MG 24 hr tablet, Take 2 tablets (1,000 mg total) by mouth 2 (two) times daily with a meal., Disp: 270 tablet, Rfl: 1   methocarbamol (ROBAXIN) 500 MG tablet, TAKE 1 TABLET(500 MG) BY MOUTH EVERY 6 HOURS AS NEEDED FOR MUSCLE SPASMS, Disp: 270 tablet, Rfl: 1   nitroGLYCERIN (NITROSTAT) 0.4 MG SL tablet, Place under the tongue., Disp: , Rfl:     rosuvastatin (CRESTOR) 10 MG tablet, Take 1 tablet (10 mg total) by mouth daily., Disp: 90 tablet, Rfl: 1   Review of Systems Review of Systems  Constitutional:  Negative for chills and fever.  Eyes:  Negative for blurred vision and double vision.  Respiratory:  Negative for cough.   Cardiovascular:  Negative for chest pain, palpitations and leg swelling.  Gastrointestinal:  Negative for abdominal pain, constipation, diarrhea, nausea and vomiting.  Genitourinary:  Negative for frequency.  Musculoskeletal:  Negative for myalgias.  Skin:  Negative for itching and rash.  Neurological:  Negative for dizziness, weakness and headaches.  Endo/Heme/Allergies:  Negative for polydipsia.       Objective:    Vitals BP 130/70   Pulse 74   Temp (!) 97.4 F (36.3 C)   Resp 16   Ht 6\' 4"  (1.93 m)   Wt 178 lb 12.8 oz (81.1 kg)   SpO2 99%   BMI 21.76 kg/m    Physical Examination Physical Exam Constitutional:      Appearance: Normal appearance. He is not ill-appearing.  Neck:     Vascular: No carotid bruit.  Cardiovascular:     Rate and Rhythm: Normal rate and regular rhythm.     Pulses: Normal pulses.     Heart sounds: No murmur heard.    No friction rub. No gallop.  Pulmonary:     Effort: Pulmonary effort is normal. No respiratory distress.     Breath sounds: No wheezing, rhonchi or rales.  Abdominal:     General: Abdomen is flat. Bowel sounds are normal. There is no distension.     Palpations: Abdomen is soft.     Tenderness: There is no abdominal tenderness.  Musculoskeletal:     Right lower leg: No edema.     Left lower leg: No edema.  Skin:    General: Skin is warm and dry.     Findings: No rash.  Neurological:     General: No focal deficit present.     Mental Status: He is alert and oriented to person, place, and time.  Psychiatric:        Mood and Affect: Mood normal.        Behavior: Behavior normal.        Assessment & Plan:   Microalbuminuria due to type 2  diabetes mellitus (HCC) He has minimal protein in his urine.  I advised him against NSAIDS.  He is on farxiga and losartan.  Type 2 diabetes mellitus with other circulatory complications [facility]) He has diabetes associated with CAD and cerebrovascular disease.  Our goal will be to keep his A1c <7%.  Hypercholesterolemia We will check his cholesterol on his next visit.  His cholesterol has been stable  and controlled with his history of DM and CAD.  Goal is to have LDL <55.    Return in about 3 months (around 06/10/2023).   Crist Fat, MD

## 2023-03-11 LAB — HEMOGLOBIN A1C
Est. average glucose Bld gHb Est-mCnc: 166 mg/dL
Hgb A1c MFr Bld: 7.4 % — ABNORMAL HIGH (ref 4.8–5.6)

## 2023-03-15 DIAGNOSIS — I69351 Hemiplegia and hemiparesis following cerebral infarction affecting right dominant side: Secondary | ICD-10-CM | POA: Diagnosis not present

## 2023-03-15 DIAGNOSIS — G8191 Hemiplegia, unspecified affecting right dominant side: Secondary | ICD-10-CM | POA: Diagnosis not present

## 2023-03-20 ENCOUNTER — Other Ambulatory Visit: Payer: Self-pay

## 2023-03-29 ENCOUNTER — Other Ambulatory Visit: Payer: Self-pay | Admitting: Internal Medicine

## 2023-04-14 DIAGNOSIS — I69351 Hemiplegia and hemiparesis following cerebral infarction affecting right dominant side: Secondary | ICD-10-CM | POA: Diagnosis not present

## 2023-04-14 DIAGNOSIS — G8191 Hemiplegia, unspecified affecting right dominant side: Secondary | ICD-10-CM | POA: Diagnosis not present

## 2023-05-04 ENCOUNTER — Other Ambulatory Visit: Payer: Self-pay | Admitting: Internal Medicine

## 2023-05-15 DIAGNOSIS — I69351 Hemiplegia and hemiparesis following cerebral infarction affecting right dominant side: Secondary | ICD-10-CM | POA: Diagnosis not present

## 2023-05-15 DIAGNOSIS — G8191 Hemiplegia, unspecified affecting right dominant side: Secondary | ICD-10-CM | POA: Diagnosis not present

## 2023-05-16 ENCOUNTER — Other Ambulatory Visit: Payer: Self-pay | Admitting: Internal Medicine

## 2023-05-28 ENCOUNTER — Other Ambulatory Visit: Payer: Self-pay | Admitting: Internal Medicine

## 2023-06-09 ENCOUNTER — Ambulatory Visit: Payer: PPO | Admitting: Internal Medicine

## 2023-06-15 DIAGNOSIS — I69351 Hemiplegia and hemiparesis following cerebral infarction affecting right dominant side: Secondary | ICD-10-CM | POA: Diagnosis not present

## 2023-06-15 DIAGNOSIS — G8191 Hemiplegia, unspecified affecting right dominant side: Secondary | ICD-10-CM | POA: Diagnosis not present

## 2023-06-20 ENCOUNTER — Ambulatory Visit: Payer: PPO | Admitting: Internal Medicine

## 2023-06-23 ENCOUNTER — Ambulatory Visit: Payer: PPO | Admitting: Internal Medicine

## 2023-06-23 ENCOUNTER — Encounter: Payer: Self-pay | Admitting: Internal Medicine

## 2023-06-23 VITALS — BP 138/84 | HR 71 | Temp 97.9°F | Resp 17 | Ht 76.0 in | Wt 175.0 lb

## 2023-06-23 DIAGNOSIS — E1129 Type 2 diabetes mellitus with other diabetic kidney complication: Secondary | ICD-10-CM | POA: Diagnosis not present

## 2023-06-23 DIAGNOSIS — Z23 Encounter for immunization: Secondary | ICD-10-CM

## 2023-06-23 DIAGNOSIS — I10 Essential (primary) hypertension: Secondary | ICD-10-CM | POA: Diagnosis not present

## 2023-06-23 DIAGNOSIS — E785 Hyperlipidemia, unspecified: Secondary | ICD-10-CM

## 2023-06-23 DIAGNOSIS — R809 Proteinuria, unspecified: Secondary | ICD-10-CM | POA: Diagnosis not present

## 2023-06-23 NOTE — Progress Notes (Signed)
Office Visit  Subjective   Patient ID: Jacob Short   DOB: 1946/08/09   Age: 77 y.o.   MRN: [id number]   Chief Complaint Chief Complaint  Patient presents with   Follow-up     History of Present Illness The patient is a 77 year old Caucasian/White male who returns for a follow-up visit for his T2 diabetes.  On his last visit, his A1c was elevated and we increased his farxiga from 5mg  to 10mg  daily.  Since his last visit, he states he has not had any problems.  He is no longer on Trulicity as he ran in his donut hole.  He remains on metformin 2000 mg daily dose and farxiga 10mg  daily.  He specifically denies chest pain, shortness of breath, unexplained fatigue, unexplained abdominal pain, nausea or vomiting, and documented hypoglycemia. He is not regularly checking his FSBS.  He came in fasting today in anticipation of lab work. His last HgbA1c was done 3 months ago and was 7.4%.  He does not have a history of diabetic retinopathy, neuropathy or nephropathy noted.  He does have a history of CAD with stent placement as described above.  He had some minor microalbuminuria 3 months ago but again he is on farxiga and an ARB.   He has not had a dilated eye exam since 2022.  He is scheduled to see Dr. Precious Bard in 06/2023 for his yearly eye exam.   The patient is a 77 year old Caucasian/White male who presents for a follow-up evaluation of hypertension.  This past year, his BP was controlled and we increased his losartan from 25mg  daily to 50mg  daily.  The patient has not been checking his blood pressure at home. The patient's current medications include: carvedilol 12.5 mg once daily and losaratan 50mg  daily. The patient has been tolerating his medications well. The patient denies any dizziness, lightheadness, chest pain, shortness of breath, weakness/numbness, and edema. His last eye exam was 9 months ago. He reports there have been no other symptoms noted.   Jacob Short returns today for  routine followup on his cholesterol. In 2020, we changed his lipitor to crestor. Overall, he states he is doing well and is without any complaints or problems at this time. In 11/2022, his LDL was 47.  He specifically denies abdominal pain, nausea, vomiting, diarrhea, myalgias, and fatigue. He remains on dietary management as well as the following cholesterol lowering medications Crestor 10 mg qhs. He is fasting in anticipation for labs today.       Past Medical History Past Medical History:  Diagnosis Date   BPH (benign prostatic hyperplasia)    Cerebrovascular disease    Coronary artery disease    History of kidney stones    Hyperlipidemia    Hypertension    Peripheral vascular disease (HCC)    T2DM (type 2 diabetes mellitus) (HCC)    Tobacco abuse      Allergies Allergies  Allergen Reactions   Lisinopril Swelling     Medications  Current Outpatient Medications:    aspirin EC 325 MG tablet, Take 1 tablet (325 mg total) by mouth daily., Disp: 30 tablet, Rfl: 0   Baclofen 5 MG TABS, TAKE 1 TABLET BY MOUTH EVERY MORNING, NOON, AND EVERY NIGHT AT BEDTIME, Disp: 90 tablet, Rfl: 3   carvedilol (COREG) 12.5 MG tablet, TAKE 1 TABLET(12.5 MG) BY MOUTH TWICE DAILY WITH A MEAL, Disp: 120 tablet, Rfl: 1   Cholecalciferol (VITAMIN D) 50 MCG (2000 UT)  tablet, Take 2,000 Units by mouth daily., Disp: , Rfl:    clopidogrel (PLAVIX) 75 MG tablet, Take 1 tablet (75 mg total) by mouth daily., Disp: 30 tablet, Rfl: 5   dapagliflozin propanediol (FARXIGA) 10 MG TABS tablet, Take 10 mg by mouth daily., Disp: , Rfl:    losartan (COZAAR) 50 MG tablet, TAKE 1 TABLET(50 MG) BY MOUTH DAILY, Disp: 90 tablet, Rfl: 0   metFORMIN (GLUCOPHAGE-XR) 500 MG 24 hr tablet, TAKE 2 TABLETS(1000 MG) BY MOUTH TWICE DAILY WITH A MEAL, Disp: 270 tablet, Rfl: 1   methocarbamol (ROBAXIN) 500 MG tablet, TAKE 1 TABLET(500 MG) BY MOUTH EVERY 6 HOURS AS NEEDED FOR MUSCLE SPASMS, Disp: 270 tablet, Rfl: 1   nitroGLYCERIN  (NITROSTAT) 0.4 MG SL tablet, Place under the tongue., Disp: , Rfl:    rosuvastatin (CRESTOR) 10 MG tablet, TAKE 1 TABLET(10 MG) BY MOUTH DAILY, Disp: 90 tablet, Rfl: 1   Review of Systems Review of Systems  Constitutional:  Negative for chills, fever, malaise/fatigue and weight loss.  Eyes:  Negative for blurred vision and double vision.  Respiratory:  Negative for cough and shortness of breath.   Cardiovascular:  Negative for chest pain, palpitations and leg swelling.  Gastrointestinal:  Negative for abdominal pain, constipation, diarrhea, heartburn, nausea and vomiting.  Genitourinary:  Negative for frequency.  Musculoskeletal:  Negative for myalgias.  Skin:  Negative for itching and rash.  Neurological:  Negative for dizziness, weakness and headaches.  Endo/Heme/Allergies:  Negative for polydipsia.       Objective:    Vitals BP 138/84   Pulse 71   Temp 97.9 F (36.6 C)   Resp 17   Ht 6\' 4"  (1.93 m)   Wt 175 lb (79.4 kg)   SpO2 99%   BMI 21.30 kg/m    Physical Examination Physical Exam Constitutional:      Appearance: Normal appearance. He is not ill-appearing.  Cardiovascular:     Rate and Rhythm: Normal rate and regular rhythm.     Pulses: Normal pulses.     Heart sounds: No murmur heard.    No friction rub. No gallop.  Pulmonary:     Effort: Pulmonary effort is normal. No respiratory distress.     Breath sounds: No wheezing, rhonchi or rales.  Abdominal:     General: Abdomen is flat. Bowel sounds are normal. There is no distension.     Palpations: Abdomen is soft.     Tenderness: There is no abdominal tenderness.  Musculoskeletal:     Right lower leg: No edema.     Left lower leg: No edema.  Skin:    General: Skin is warm and dry.     Findings: No rash.  Neurological:     General: No focal deficit present.     Mental Status: He is alert and oriented to person, place, and time.  Psychiatric:        Mood and Affect: Mood normal.        Behavior: Behavior  normal.        Assessment & Plan:   Microalbuminuria due to type 2 diabetes mellitus (HCC) We will check his HgBA1c today.  I want him to continue his meds and watch his diet and continue to be active.  Essential hypertension His BP is doing well today.  We will continue to monitor.  Hyperlipidemia We will repeat his FLP today and his goal LDL <55% due to history of diabetes and stroke.    Return in about  3 months (around 09/22/2023).   Crist Fat, MD

## 2023-06-23 NOTE — Assessment & Plan Note (Signed)
We will repeat his FLP today and his goal LDL <55% due to history of diabetes and stroke.

## 2023-06-23 NOTE — Assessment & Plan Note (Signed)
His BP is doing well today.  We will continue to monitor.

## 2023-06-23 NOTE — Assessment & Plan Note (Signed)
We will check his HgBA1c today.  I want him to continue his meds and watch his diet and continue to be active.

## 2023-06-24 ENCOUNTER — Other Ambulatory Visit: Payer: Self-pay | Admitting: Internal Medicine

## 2023-06-24 LAB — CMP14 + ANION GAP
ALT: 6 [IU]/L (ref 0–44)
AST: 12 [IU]/L (ref 0–40)
Albumin: 4.4 g/dL (ref 3.8–4.8)
Alkaline Phosphatase: 112 [IU]/L (ref 44–121)
Anion Gap: 14 mmol/L (ref 10.0–18.0)
BUN/Creatinine Ratio: 14 (ref 10–24)
BUN: 10 mg/dL (ref 8–27)
Bilirubin Total: 0.7 mg/dL (ref 0.0–1.2)
CO2: 23 mmol/L (ref 20–29)
Calcium: 9.2 mg/dL (ref 8.6–10.2)
Chloride: 103 mmol/L (ref 96–106)
Creatinine, Ser: 0.7 mg/dL — ABNORMAL LOW (ref 0.76–1.27)
Globulin, Total: 2.7 g/dL (ref 1.5–4.5)
Glucose: 133 mg/dL — ABNORMAL HIGH (ref 70–99)
Potassium: 4.9 mmol/L (ref 3.5–5.2)
Sodium: 140 mmol/L (ref 134–144)
Total Protein: 7.1 g/dL (ref 6.0–8.5)
eGFR: 94 mL/min/{1.73_m2} (ref >59)

## 2023-06-24 LAB — LIPID PANEL
Chol/HDL Ratio: 3.3 {ratio} (ref 0.0–5.0)
Cholesterol, Total: 101 mg/dL (ref 100–199)
HDL: 31 mg/dL — ABNORMAL LOW (ref >39)
LDL Chol Calc (NIH): 48 mg/dL (ref 0–99)
Triglycerides: 123 mg/dL (ref 0–149)
VLDL Cholesterol Cal: 22 mg/dL (ref 5–40)

## 2023-06-24 LAB — HEMOGLOBIN A1C
Est. average glucose Bld gHb Est-mCnc: 163 mg/dL
Hgb A1c MFr Bld: 7.3 % — ABNORMAL HIGH (ref 4.8–5.6)

## 2023-06-26 ENCOUNTER — Other Ambulatory Visit: Payer: Self-pay

## 2023-06-26 DIAGNOSIS — E1159 Type 2 diabetes mellitus with other circulatory complications: Secondary | ICD-10-CM

## 2023-06-26 DIAGNOSIS — E1129 Type 2 diabetes mellitus with other diabetic kidney complication: Secondary | ICD-10-CM

## 2023-06-26 MED ORDER — GLIPIZIDE ER 2.5 MG PO TB24
2.5000 mg | ORAL_TABLET | Freq: Every day | ORAL | 1 refills | Status: DC
Start: 2023-06-26 — End: 2023-08-20

## 2023-06-26 NOTE — Progress Notes (Signed)
His diabetes is not controlled. Add glipizide 2.5mg  po BID to his regimen   Patient aware of lab and new med.

## 2023-06-26 NOTE — Progress Notes (Signed)
New RX

## 2023-06-27 ENCOUNTER — Other Ambulatory Visit: Payer: Self-pay | Admitting: Internal Medicine

## 2023-07-15 DIAGNOSIS — G8191 Hemiplegia, unspecified affecting right dominant side: Secondary | ICD-10-CM | POA: Diagnosis not present

## 2023-07-15 DIAGNOSIS — I69351 Hemiplegia and hemiparesis following cerebral infarction affecting right dominant side: Secondary | ICD-10-CM | POA: Diagnosis not present

## 2023-07-27 ENCOUNTER — Other Ambulatory Visit: Payer: Self-pay | Admitting: Internal Medicine

## 2023-07-28 NOTE — Progress Notes (Deleted)
Cardiology Office Note:    Date:  07/29/2023   ID:  Gordy Councilman, DOB October 07, 1945, MRN [id number]  PCP:  Crist Fat, MD  Cardiologist:  Norman Herrlich, MD    Referring MD: Crist Fat, MD    ASSESSMENT:    1. Coronary artery disease involving native coronary artery of native heart without angina pectoris   2. Essential hypertension   3. Type 2 diabetes mellitus with vascular disease (HCC)   4. Mixed hyperlipidemia    PLAN:    In order of problems listed above:  ***   Next appointment: ***   Medication Adjustments/Labs and Tests Ordered: Current medicines are reviewed at length with the patient today.  Concerns regarding medicines are outlined above.  No orders of the defined types were placed in this encounter.  No orders of the defined types were placed in this encounter.    History of Present Illness:    Jacob Short is a 77 y.o. male with a hx of CAD with PCI and stent proximal right coronary artery 2003 hypertension hyperlipidemia type 2 diabetes and abdominal aortic aneurysm with EVAR February 2018 last seen 11/26/2022.  The visit a carotid duplex performed showing no extracranial carotid or vertebral stenosis.  Compliance with diet, lifestyle and medications: *** Past Medical History:  Diagnosis Date   BPH (benign prostatic hyperplasia)    Cerebrovascular disease    Coronary artery disease    History of kidney stones    Hyperlipidemia    Hypertension    Peripheral vascular disease (HCC)    T2DM (type 2 diabetes mellitus) (HCC)    Tobacco abuse     Current Medications: No outpatient medications have been marked as taking for the 07/29/23 encounter (Appointment) with Baldo Daub, MD.      EKGs/Labs/Other Studies Reviewed:    The following studies were reviewed today:  Cardiac Studies & Procedures       ECHOCARDIOGRAM  ECHOCARDIOGRAM COMPLETE 06/16/2020  Narrative ECHOCARDIOGRAM REPORT    Patient Name:   Jacob Short  Date of Exam: 06/16/2020 Medical Rec #:  [id number]       Height:       76.0 in Accession #:    [id number]      Weight:       181.0 lb Date of Birth:  May 23, 1946        BSA:          2.123 m Patient Age:    77 years        BP:           183/103 mmHg Patient Gender: M               HR:           59 bpm. Exam Location:  Inpatient  Procedure: 2D Echo, Cardiac Doppler and Color Doppler  Indications:    Stroke 434.91 / I163.9  History:        Patient has no prior history of Echocardiogram examinations. CAD; Risk Factors:Diabetes.  Sonographer:    Eulah Pont RDCS Referring Phys: [id number] [facility]  IMPRESSIONS   1. Left ventricular ejection fraction, by estimation, is 55 to 60%. The left ventricle has normal function. The left ventricle has no regional wall motion abnormalities. Left ventricular diastolic parameters are consistent with Grade I diastolic dysfunction (impaired relaxation). 2. Right ventricular systolic function is normal. The right ventricular size is normal. Tricuspid regurgitation signal is inadequate for assessing PA pressure. 3. The  mitral valve is grossly normal. Trivial mitral valve regurgitation. No evidence of mitral stenosis. 4. The aortic valve is tricuspid. Aortic valve regurgitation is not visualized. No aortic stenosis is present. 5. The inferior vena cava is normal in size with greater than 50% respiratory variability, suggesting right atrial pressure of 3 mmHg.  Conclusion(s)/Recommendation(s): No intracardiac source of embolism detected on this transthoracic study. A transesophageal echocardiogram is recommended to exclude cardiac source of embolism if clinically indicated.  FINDINGS Left Ventricle: Left ventricular ejection fraction, by estimation, is 55 to 60%. The left ventricle has normal function. The left ventricle has no regional wall motion abnormalities. The left ventricular internal cavity size was normal in size. There is no left  ventricular hypertrophy. Left ventricular diastolic parameters are consistent with Grade I diastolic dysfunction (impaired relaxation). Normal left ventricular filling pressure.  Right Ventricle: The right ventricular size is normal. No increase in right ventricular wall thickness. Right ventricular systolic function is normal. Tricuspid regurgitation signal is inadequate for assessing PA pressure.  Left Atrium: Left atrial size was normal in size.  Right Atrium: Right atrial size was normal in size. Prominent Eustachian valve.  Pericardium: Trivial pericardial effusion is present.  Mitral Valve: The mitral valve is grossly normal. Trivial mitral valve regurgitation. No evidence of mitral valve stenosis.  Tricuspid Valve: The tricuspid valve is grossly normal. Tricuspid valve regurgitation is trivial. No evidence of tricuspid stenosis.  Aortic Valve: The aortic valve is tricuspid. Aortic valve regurgitation is not visualized. No aortic stenosis is present.  Pulmonic Valve: The pulmonic valve was grossly normal. Pulmonic valve regurgitation is not visualized. No evidence of pulmonic stenosis.  Aorta: The aortic root and ascending aorta are structurally normal, with no evidence of dilitation.  Venous: The inferior vena cava is normal in size with greater than 50% respiratory variability, suggesting right atrial pressure of 3 mmHg.  IAS/Shunts: The atrial septum is grossly normal.  EKG: Rhythm strip during this exam demostrated normal sinus rhythm and premature ventricular contractions.   LEFT VENTRICLE PLAX 2D LVIDd:         5.40 cm      Diastology LVIDs:         3.50 cm      LV e' medial:    4.30 cm/s LV PW:         1.30 cm      LV E/e' medial:  11.9 LV IVS:        1.00 cm      LV e' lateral:   7.54 cm/s LVOT diam:     2.40 cm      LV E/e' lateral: 6.8 LV SV:         92 LV SV Index:   43 LVOT Area:     4.52 cm  LV Volumes (MOD) LV vol d, MOD A2C: 118.0 ml LV vol d, MOD A4C:  118.0 ml LV vol s, MOD A2C: 65.3 ml LV vol s, MOD A4C: 66.1 ml LV SV MOD A2C:     52.7 ml LV SV MOD A4C:     118.0 ml LV SV MOD BP:      53.6 ml  RIGHT VENTRICLE RV S prime:     7.14 cm/s TAPSE (M-mode): 1.6 cm  LEFT ATRIUM             Index       RIGHT ATRIUM           Index LA diam:  3.10 cm 1.46 cm/m  RA Area:     14.50 cm LA Vol (A2C):   80.5 ml 37.91 ml/m RA Volume:   33.10 ml  15.59 ml/m LA Vol (A4C):   43.1 ml 20.30 ml/m LA Biplane Vol: 61.2 ml 28.82 ml/m AORTIC VALVE LVOT Vmax:   95.50 cm/s LVOT Vmean:  64.750 cm/s LVOT VTI:    0.202 m  AORTA Ao Root diam: 3.50 cm Ao Asc diam:  3.30 cm  MITRAL VALVE MV Area (PHT): 1.91 cm    SHUNTS MV Decel Time: 398 msec    Systemic VTI:  0.20 m MV E velocity: 51.00 cm/s  Systemic Diam: 2.40 cm MV A velocity: 67.90 cm/s MV E/A ratio:  0.75  Lennie Odor MD Electronically signed by Lennie Odor MD Signature Date/Time: 06/16/2020/10:58:42 AM    Final                 Recent Labs: 12/10/2022: Hemoglobin 13.3; Platelets 269; TSH 2.800 06/23/2023: ALT 6; BUN 10; Creatinine, Ser 0.70; Potassium 4.9; Sodium 140  Recent Lipid Panel    Component Value Date/Time   CHOL 101 06/23/2023 1126   TRIG 123 06/23/2023 1126   HDL 31 (L) 06/23/2023 1126   CHOLHDL 3.3 06/23/2023 1126   CHOLHDL 2.8 06/15/2020 1822   VLDL 29 06/15/2020 1822   LDLCALC 48 06/23/2023 1126    Physical Exam:    VS:  There were no vitals taken for this visit.    Wt Readings from Last 3 Encounters:  06/23/23 175 lb (79.4 kg)  03/10/23 178 lb 12.8 oz (81.1 kg)  02/10/23 177 lb 9.6 oz (80.6 kg)     GEN: *** Well nourished, well developed in no acute distress HEENT: Normal NECK: No JVD; No carotid bruits LYMPHATICS: No lymphadenopathy CARDIAC: ***RRR, no murmurs, rubs, gallops RESPIRATORY:  Clear to auscultation without rales, wheezing or rhonchi  ABDOMEN: Soft, non-tender, non-distended MUSCULOSKELETAL:  No edema; No deformity  SKIN:  Warm and dry NEUROLOGIC:  Alert and oriented x 3 PSYCHIATRIC:  Normal affect    Signed, Norman Herrlich, MD  07/29/2023 11:46 AM    [facility]

## 2023-07-29 ENCOUNTER — Ambulatory Visit: Payer: PPO | Admitting: Cardiology

## 2023-08-12 DIAGNOSIS — Z01818 Encounter for other preprocedural examination: Secondary | ICD-10-CM | POA: Diagnosis not present

## 2023-08-12 DIAGNOSIS — H2512 Age-related nuclear cataract, left eye: Secondary | ICD-10-CM | POA: Diagnosis not present

## 2023-08-12 DIAGNOSIS — H25811 Combined forms of age-related cataract, right eye: Secondary | ICD-10-CM | POA: Diagnosis not present

## 2023-08-15 DIAGNOSIS — I69351 Hemiplegia and hemiparesis following cerebral infarction affecting right dominant side: Secondary | ICD-10-CM | POA: Diagnosis not present

## 2023-08-15 DIAGNOSIS — G8191 Hemiplegia, unspecified affecting right dominant side: Secondary | ICD-10-CM | POA: Diagnosis not present

## 2023-08-20 ENCOUNTER — Other Ambulatory Visit: Payer: Self-pay | Admitting: Internal Medicine

## 2023-08-20 DIAGNOSIS — E1159 Type 2 diabetes mellitus with other circulatory complications: Secondary | ICD-10-CM

## 2023-08-23 ENCOUNTER — Other Ambulatory Visit: Payer: Self-pay | Admitting: Internal Medicine

## 2023-08-26 ENCOUNTER — Other Ambulatory Visit: Payer: Self-pay | Admitting: Internal Medicine

## 2023-08-27 DIAGNOSIS — H25811 Combined forms of age-related cataract, right eye: Secondary | ICD-10-CM | POA: Diagnosis not present

## 2023-08-27 DIAGNOSIS — E1136 Type 2 diabetes mellitus with diabetic cataract: Secondary | ICD-10-CM | POA: Diagnosis not present

## 2023-08-27 DIAGNOSIS — I2510 Atherosclerotic heart disease of native coronary artery without angina pectoris: Secondary | ICD-10-CM | POA: Diagnosis not present

## 2023-08-27 DIAGNOSIS — I10 Essential (primary) hypertension: Secondary | ICD-10-CM | POA: Diagnosis not present

## 2023-09-03 NOTE — Progress Notes (Unsigned)
Cardiology Office Note:    Date:  09/04/2023   ID:  Jacob Short, DOB Dec 11, 1945, MRN [id number]  PCP:  Crist Fat, MD  Cardiologist:  Norman Herrlich, MD    Referring MD: Crist Fat, MD    ASSESSMENT:    1. Coronary artery disease involving native coronary artery of native heart without angina pectoris   2. Essential hypertension   3. Type 2 diabetes mellitus with vascular disease (HCC)   4. Mixed hyperlipidemia    PLAN:    In order of problems listed above:  Sunday continues to do well with CAD with remote PCI New York Heart Association class I no anginal discomfort and continue treatment including aspirin and clopidogrel high intensity statin nitroglycerin if it would be needed in his antihypertensive Well-controlled continue ARB Stable diabetes managed by his PCP DL at target continue his high intensity statin without muscle symptoms   Next appointment: 1 year   Medication Adjustments/Labs and Tests Ordered: Current medicines are reviewed at length with the patient today.  Concerns regarding medicines are outlined above.  Orders Placed This Encounter  Procedures   EKG 12-Lead   No orders of the defined types were placed in this encounter.    History of Present Illness:    Jacob Short is a 77 y.o. male with a hx of CAD with PCI and stent proximal mid right coronary artery 2003 history of stroke hypertension hyperlipidemia type 2 diabetes and history of EVAR for abdominal aortic aneurysm in February 2018 last seen 11/26/2022.  On the visit he had a carotid duplex reported 12/09/2022 there was no stenosis noted.  It was normal. His lipid profile 2 months ago was on target with an LDL of 48 cholesterol 101 non-HDL cholesterol 70 triglycerides normal 123  Compliance with diet, lifestyle and medications: Yes  His daughter-in-law is present participates in evaluation decision making He continues to do well he has had no cardiovascular symptoms edema  shortness of breath chest pain palpitation or syncope He tolerates his statin without muscle pain or weakness He has had no GI side effects and with his peripheral vascular disease and coronary disease I would keep him on his dual antiplatelet therapy I told his daughter-in-law any GI symptoms to withdraw aspirin Past Medical History:  Diagnosis Date   BPH (benign prostatic hyperplasia)    Cerebrovascular disease    Coronary artery disease    History of kidney stones    Hyperlipidemia    Hypertension    Peripheral vascular disease (HCC)    T2DM (type 2 diabetes mellitus) (HCC)    Tobacco abuse     Current Medications: Current Meds  Medication Sig   aspirin EC 325 MG tablet Take 1 tablet (325 mg total) by mouth daily.   Baclofen 5 MG TABS TAKE 1 TABLET BY MOUTH EVERY MORNING, 1 TABLET AT NOON, AND 1 TABLET EVERY NIGHT AT BEDTIME (Patient taking differently: Take 1 tablet by mouth 3 (three) times daily.)   carvedilol (COREG) 12.5 MG tablet TAKE 1 TABLET(12.5 MG) BY MOUTH TWICE DAILY WITH A MEAL (Patient taking differently: Take 12.5 mg by mouth 2 (two) times daily with a meal.)   Cholecalciferol (VITAMIN D) 50 MCG (2000 UT) tablet Take 2,000 Units by mouth daily.   clopidogrel (PLAVIX) 75 MG tablet TAKE 1 TABLET(75 MG) BY MOUTH DAILY (Patient taking differently: Take 75 mg by mouth daily.)   dapagliflozin propanediol (FARXIGA) 10 MG TABS tablet Take 10 mg by mouth daily.  glipiZIDE (GLUCOTROL XL) 2.5 MG 24 hr tablet TAKE 1 TABLET(2.5 MG) BY MOUTH DAILY WITH BREAKFAST (Patient taking differently: Take 2.5 mg by mouth daily with breakfast.)   losartan (COZAAR) 50 MG tablet TAKE 1 TABLET(50 MG) BY MOUTH DAILY (Patient taking differently: Take 50 mg by mouth daily.)   metFORMIN (GLUCOPHAGE-XR) 500 MG 24 hr tablet TAKE 2 TABLETS(1000 MG) BY MOUTH TWICE DAILY WITH A MEAL (Patient taking differently: Take 500 mg by mouth daily with breakfast.)   methocarbamol (ROBAXIN) 500 MG tablet TAKE 1  TABLET(500 MG) BY MOUTH EVERY 6 HOURS AS NEEDED FOR MUSCLE SPASMS (Patient taking differently: Take 500 mg by mouth every 6 (six) hours as needed for muscle spasms.)   nitroGLYCERIN (NITROSTAT) 0.4 MG SL tablet Place under the tongue.   rosuvastatin (CRESTOR) 10 MG tablet TAKE 1 TABLET(10 MG) BY MOUTH DAILY (Patient taking differently: Take 10 mg by mouth daily.)      EKGs/Labs/Other Studies Reviewed:    The following studies were reviewed today:    EKG Interpretation Date/Time:  Thursday September 04 2023 08:14:29 EST Ventricular Rate:  65 PR Interval:  196 QRS Duration:  106 QT Interval:  400 QTC Calculation: 416 R Axis:   -54  Text Interpretation: Normal sinus rhythm Left anterior fascicular block Poor R wave progression Abnormal ECG No previous ECGs available Confirmed by Norman Herrlich ([id number]) on 09/04/2023 8:18:05 AM   Recent Labs: 12/10/2022: Hemoglobin 13.3; Platelets 269; TSH 2.800 06/23/2023: ALT 6; BUN 10; Creatinine, Ser 0.70; Potassium 4.9; Sodium 140  Recent Lipid Panel    Component Value Date/Time   CHOL 101 06/23/2023 1126   TRIG 123 06/23/2023 1126   HDL 31 (L) 06/23/2023 1126   CHOLHDL 3.3 06/23/2023 1126   CHOLHDL 2.8 06/15/2020 1822   VLDL 29 06/15/2020 1822   LDLCALC 48 06/23/2023 1126    Physical Exam:    VS:  BP 104/60 (BP Location: Left Arm, Patient Position: Sitting, Cuff Size: Normal)   Pulse 65   Ht 6\' 4"  (1.93 m)   Wt 177 lb 3.2 oz (80.4 kg)   SpO2 98%   BMI 21.57 kg/m     Wt Readings from Last 3 Encounters:  09/04/23 177 lb 3.2 oz (80.4 kg)  06/23/23 175 lb (79.4 kg)  03/10/23 178 lb 12.8 oz (81.1 kg)     GEN:  Well nourished, well developed in no acute distress HEENT: Normal NECK: No JVD; No carotid bruits LYMPHATICS: No lymphadenopathy CARDIAC: RRR, no murmurs, rubs, gallops RESPIRATORY:  Clear to auscultation without rales, wheezing or rhonchi  ABDOMEN: Soft, non-tender, non-distended MUSCULOSKELETAL:  No edema; No deformity   SKIN: Warm and dry NEUROLOGIC:  Alert and oriented x 3 PSYCHIATRIC:  Normal affect    Signed, Norman Herrlich, MD  09/04/2023 8:30 AM    [facility]

## 2023-09-04 ENCOUNTER — Ambulatory Visit: Payer: PPO | Attending: Cardiology | Admitting: Cardiology

## 2023-09-04 VITALS — BP 104/60 | HR 65 | Ht 76.0 in | Wt 177.2 lb

## 2023-09-04 DIAGNOSIS — I10 Essential (primary) hypertension: Secondary | ICD-10-CM

## 2023-09-04 DIAGNOSIS — I2510 Atherosclerotic heart disease of native coronary artery without angina pectoris: Secondary | ICD-10-CM

## 2023-09-04 DIAGNOSIS — E782 Mixed hyperlipidemia: Secondary | ICD-10-CM

## 2023-09-04 DIAGNOSIS — E1159 Type 2 diabetes mellitus with other circulatory complications: Secondary | ICD-10-CM | POA: Diagnosis not present

## 2023-09-04 NOTE — Patient Instructions (Signed)

## 2023-09-10 DIAGNOSIS — H2512 Age-related nuclear cataract, left eye: Secondary | ICD-10-CM | POA: Diagnosis not present

## 2023-09-10 DIAGNOSIS — E1136 Type 2 diabetes mellitus with diabetic cataract: Secondary | ICD-10-CM | POA: Diagnosis not present

## 2023-09-10 DIAGNOSIS — I10 Essential (primary) hypertension: Secondary | ICD-10-CM | POA: Diagnosis not present

## 2023-09-10 DIAGNOSIS — H25812 Combined forms of age-related cataract, left eye: Secondary | ICD-10-CM | POA: Diagnosis not present

## 2023-09-10 DIAGNOSIS — E785 Hyperlipidemia, unspecified: Secondary | ICD-10-CM | POA: Diagnosis not present

## 2023-09-14 DIAGNOSIS — I69351 Hemiplegia and hemiparesis following cerebral infarction affecting right dominant side: Secondary | ICD-10-CM | POA: Diagnosis not present

## 2023-09-14 DIAGNOSIS — G8191 Hemiplegia, unspecified affecting right dominant side: Secondary | ICD-10-CM | POA: Diagnosis not present

## 2023-09-22 ENCOUNTER — Other Ambulatory Visit: Payer: Self-pay | Admitting: Internal Medicine

## 2023-09-25 ENCOUNTER — Ambulatory Visit: Payer: PPO | Admitting: Internal Medicine

## 2023-09-25 ENCOUNTER — Encounter: Payer: Self-pay | Admitting: Internal Medicine

## 2023-09-25 VITALS — BP 132/80 | HR 74 | Temp 98.6°F | Resp 16 | Ht 76.0 in | Wt 176.4 lb

## 2023-09-25 DIAGNOSIS — E1129 Type 2 diabetes mellitus with other diabetic kidney complication: Secondary | ICD-10-CM | POA: Diagnosis not present

## 2023-09-25 DIAGNOSIS — R809 Proteinuria, unspecified: Secondary | ICD-10-CM

## 2023-09-25 NOTE — Progress Notes (Signed)
 Office Visit  Subjective   Patient ID: Jacob Short   DOB: 1946-09-03   Age: 78 y.o.   MRN: [id number]   Chief Complaint Chief Complaint  Patient presents with   Follow-up     History of Present Illness The patient is a 78 year old Caucasian/White male who returns for a follow-up visit for his T2 diabetes.  On his last visit, his A1c was elevated and started him on glipizide  XL 2.5mg  daily.  This past year, we increased his farxiga  from 5mg  to 10mg  daily.  Since his last visit, he states he has not had any problems.  He is no longer on Trulicity as he ran in his donut hole.  He remains on metformin  2000 mg daily dose, glipizide  XL 2.5mg  daily, and farxiga  10mg  daily.  He specifically denies chest pain, shortness of breath, unexplained fatigue, unexplained abdominal pain, nausea or vomiting, and documented hypoglycemia. He is not regularly checking his FSBS.  He came in fasting today in anticipation of lab work. His last HgbA1c was done 3 months ago and was 7.3%.  He does not have a history of diabetic retinopathy, neuropathy or nephropathy noted.  He does have a history of CAD with stent placement as described above.  He had some minor microalbuminuria this past year but again he is on farxiga  and an ARB.   He did see Dr. Erasmo in 06/2023 for his yearly eye exam but I do not have his eye report.     Past Medical History Past Medical History:  Diagnosis Date   BPH (benign prostatic hyperplasia)    Cerebrovascular disease    Coronary artery disease    History of kidney stones    Hyperlipidemia    Hypertension    Peripheral vascular disease (HCC)    T2DM (type 2 diabetes mellitus) (HCC)    Tobacco abuse      Allergies Allergies  Allergen Reactions   Lisinopril Swelling     Medications  Current Outpatient Medications:    aspirin  EC 325 MG tablet, Take 1 tablet (325 mg total) by mouth daily., Disp: 30 tablet, Rfl: 0   Baclofen  5 MG TABS, TAKE 1 TABLET BY MOUTH EVERY MORNING,  1 TABLET AT NOON, AND 1 TABLET EVERY NIGHT AT BEDTIME (Patient taking differently: Take 1 tablet by mouth 3 (three) times daily.), Disp: 90 tablet, Rfl: 3   carvedilol  (COREG ) 12.5 MG tablet, TAKE 1 TABLET(12.5 MG) BY MOUTH TWICE DAILY WITH A MEAL (Patient taking differently: Take 12.5 mg by mouth 2 (two) times daily with a meal.), Disp: 120 tablet, Rfl: 1   Cholecalciferol (VITAMIN D) 50 MCG (2000 UT) tablet, Take 2,000 Units by mouth daily., Disp: , Rfl:    clopidogrel  (PLAVIX ) 75 MG tablet, TAKE 1 TABLET(75 MG) BY MOUTH DAILY (Patient taking differently: Take 75 mg by mouth daily.), Disp: 30 tablet, Rfl: 5   dapagliflozin  propanediol (FARXIGA ) 10 MG TABS tablet, Take 10 mg by mouth daily., Disp: , Rfl:    glipiZIDE  (GLUCOTROL  XL) 2.5 MG 24 hr tablet, TAKE 1 TABLET(2.5 MG) BY MOUTH DAILY WITH BREAKFAST (Patient taking differently: Take 2.5 mg by mouth daily with breakfast.), Disp: 30 tablet, Rfl: 1   losartan  (COZAAR ) 50 MG tablet, TAKE 1 TABLET(50 MG) BY MOUTH DAILY (Patient taking differently: Take 50 mg by mouth daily.), Disp: 90 tablet, Rfl: 0   metFORMIN  (GLUCOPHAGE -XR) 500 MG 24 hr tablet, TAKE 2 TABLETS(1000 MG) BY MOUTH TWICE DAILY WITH A MEAL (Patient taking differently:  Take 500 mg by mouth daily with breakfast.), Disp: 270 tablet, Rfl: 1   methocarbamol  (ROBAXIN ) 500 MG tablet, TAKE 1 TABLET(500 MG) BY MOUTH EVERY 6 HOURS AS NEEDED FOR MUSCLE SPASMS (Patient taking differently: Take 500 mg by mouth every 6 (six) hours as needed for muscle spasms.), Disp: 270 tablet, Rfl: 1   nitroGLYCERIN (NITROSTAT) 0.4 MG SL tablet, Place under the tongue., Disp: , Rfl:    rosuvastatin  (CRESTOR ) 10 MG tablet, TAKE 1 TABLET(10 MG) BY MOUTH DAILY, Disp: 90 tablet, Rfl: 1   Review of Systems Review of Systems  Constitutional:  Negative for chills and fever.  Eyes:  Negative for blurred vision and double vision.  Respiratory:  Negative for shortness of breath.   Cardiovascular:  Negative for chest pain,  palpitations and leg swelling.  Gastrointestinal:  Negative for abdominal pain, constipation, diarrhea, nausea and vomiting.  Genitourinary:  Negative for frequency.  Musculoskeletal:  Negative for myalgias.  Neurological:  Negative for dizziness, weakness and headaches.  Endo/Heme/Allergies:  Negative for polydipsia.       Objective:    Vitals BP 132/80   Pulse 74   Temp 98.6 F (37 C)   Resp 16   Ht 6' 4 (1.93 m)   Wt 176 lb 6.4 oz (80 kg)   SpO2 99%   BMI 21.47 kg/m    Physical Examination Physical Exam Constitutional:      Appearance: Normal appearance. He is not ill-appearing.  Cardiovascular:     Rate and Rhythm: Normal rate and regular rhythm.     Pulses: Normal pulses.     Heart sounds: No murmur heard.    No friction rub. No gallop.  Pulmonary:     Effort: Pulmonary effort is normal. No respiratory distress.     Breath sounds: No wheezing, rhonchi or rales.  Abdominal:     General: Abdomen is flat. Bowel sounds are normal. There is no distension.     Palpations: Abdomen is soft.     Tenderness: There is no abdominal tenderness.  Musculoskeletal:     Right lower leg: No edema.     Left lower leg: No edema.  Skin:    General: Skin is warm and dry.     Findings: No rash.  Neurological:     Mental Status: He is alert.        Assessment & Plan:   Microalbuminuria due to type 2 diabetes mellitus (HCC) We started him on glipizide  and we will check his HgBA1c at this time.  I will see him back in 3 months for a yearly exam.    Return in about 3 months (around 12/24/2023) for annual.   Selinda Fleeta Finger, MD

## 2023-09-25 NOTE — Assessment & Plan Note (Signed)
 We started him on glipizide and we will check his HgBA1c at this time.  I will see him back in 3 months for a yearly exam.

## 2023-09-26 ENCOUNTER — Ambulatory Visit: Payer: PPO | Admitting: Internal Medicine

## 2023-09-26 LAB — HEMOGLOBIN A1C
Est. average glucose Bld gHb Est-mCnc: 140 mg/dL
Hgb A1c MFr Bld: 6.5 % — ABNORMAL HIGH (ref 4.8–5.6)

## 2023-10-06 ENCOUNTER — Other Ambulatory Visit: Payer: Self-pay

## 2023-10-06 MED ORDER — METFORMIN HCL ER 500 MG PO TB24: ORAL_TABLET | ORAL | 1 refills | Status: DC

## 2023-10-07 NOTE — Progress Notes (Signed)
 His diabetes is now controlled.  Patient aware

## 2023-10-15 DIAGNOSIS — I69351 Hemiplegia and hemiparesis following cerebral infarction affecting right dominant side: Secondary | ICD-10-CM | POA: Diagnosis not present

## 2023-10-15 DIAGNOSIS — G8191 Hemiplegia, unspecified affecting right dominant side: Secondary | ICD-10-CM | POA: Diagnosis not present

## 2023-10-27 ENCOUNTER — Other Ambulatory Visit: Payer: Self-pay | Admitting: Internal Medicine

## 2023-10-27 DIAGNOSIS — E1159 Type 2 diabetes mellitus with other circulatory complications: Secondary | ICD-10-CM

## 2023-11-15 DIAGNOSIS — I69351 Hemiplegia and hemiparesis following cerebral infarction affecting right dominant side: Secondary | ICD-10-CM | POA: Diagnosis not present

## 2023-11-15 DIAGNOSIS — G8191 Hemiplegia, unspecified affecting right dominant side: Secondary | ICD-10-CM | POA: Diagnosis not present

## 2023-11-19 ENCOUNTER — Other Ambulatory Visit: Payer: Self-pay | Admitting: Internal Medicine

## 2023-12-01 IMAGING — MR MR MRA NECK WO/W CM
4 of 5 series · 25 of 48 positions shown · IV contrast (gadavist)
Comparison: Prior CTA from 06/15/2020.

CLINICAL DATA: Initial evaluation for spastic right-sided
hemiparesis from prior CTA.



[Series 1: tof_2d_tra include arch · axial · 3.5mm · 0.43mm/px · z∈[-255,-14]mm · 8 of 100 slices shown]
[im 1/100]
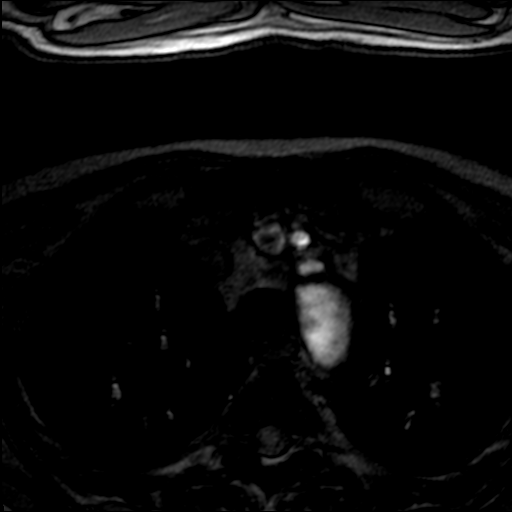
[im 12/100]
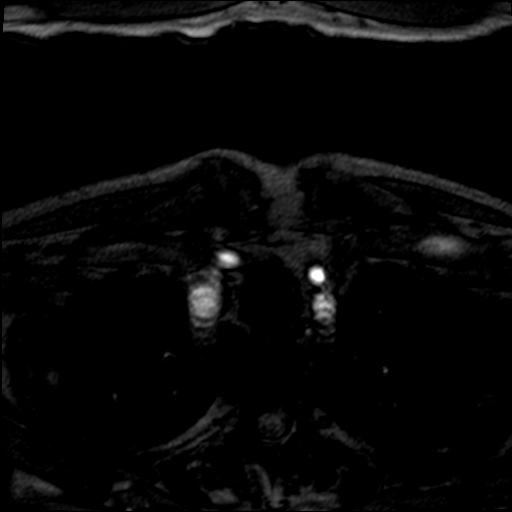
[im 34/100]
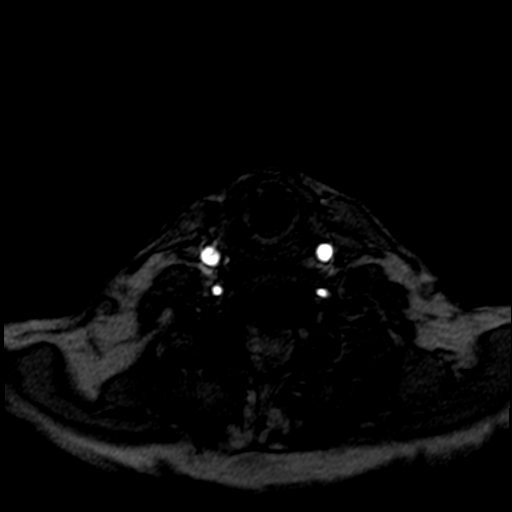
[im 45/100]
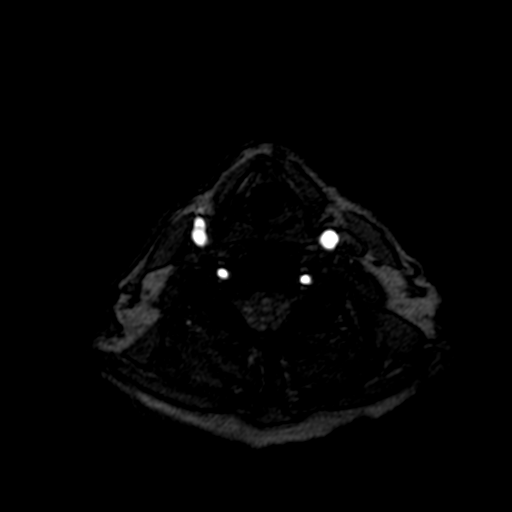
[im 56/100]
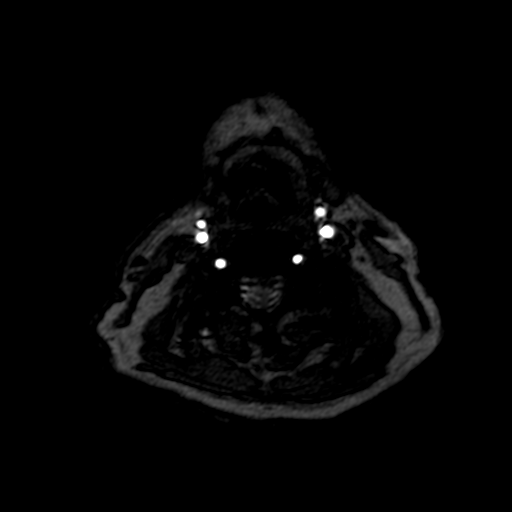
[im 67/100]
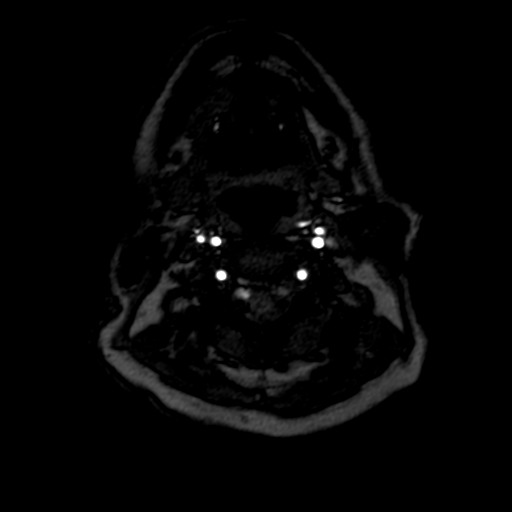
[im 89/100]
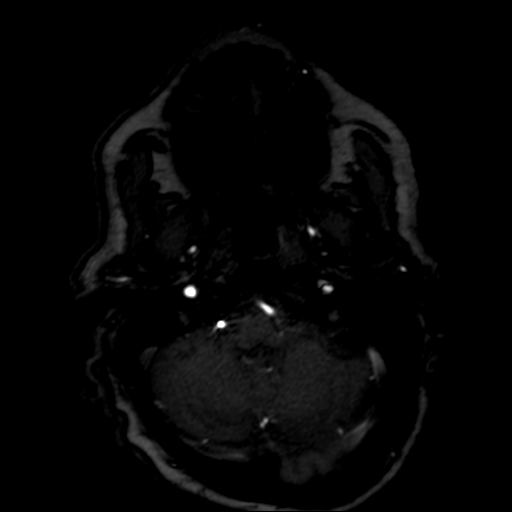
[im 100/100]
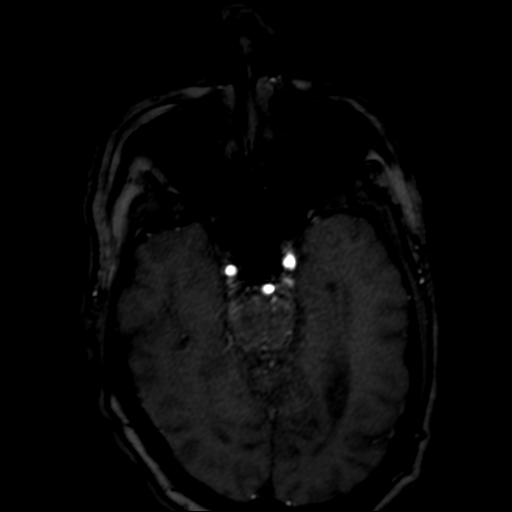

[Series 5: (id)_cor_pre · coronal · 0.8mm · 0.78mm/px · 8 of 96 slices shown]
[im 1/96]
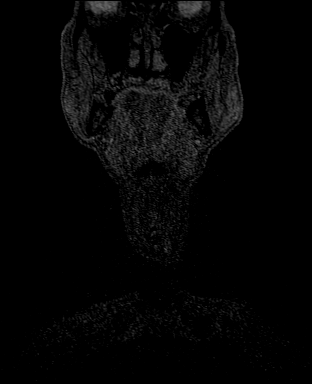
[im 11/96]
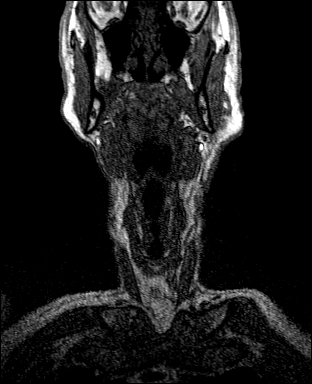
[im 32/96]
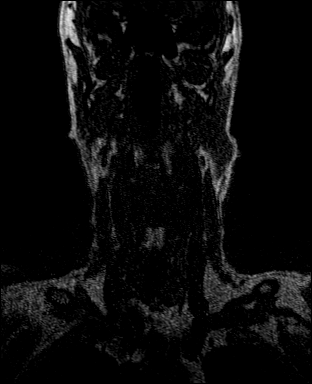
[im 43/96]
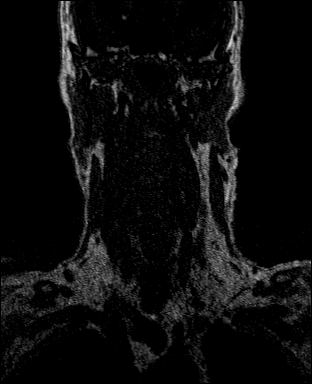
[im 53/96]
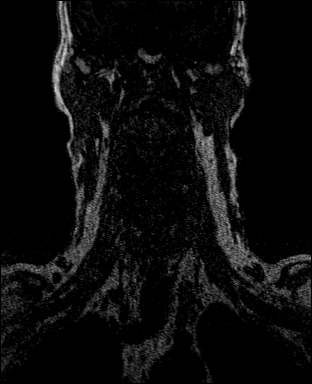
[im 64/96]
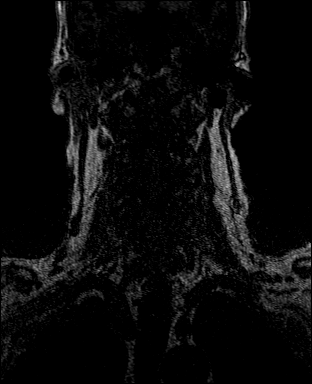
[im 85/96]
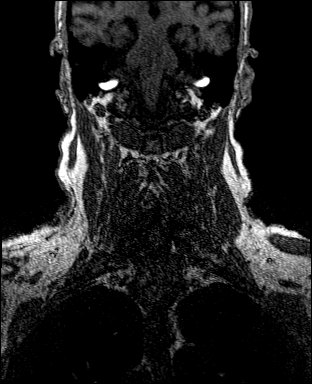
[im 96/96]
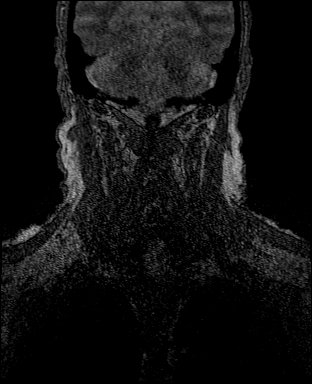

[Series 6: (id)_cor_post · coronal · 0.8mm · 0.78mm/px · 6 of 94 slices shown]
[im 1/94]
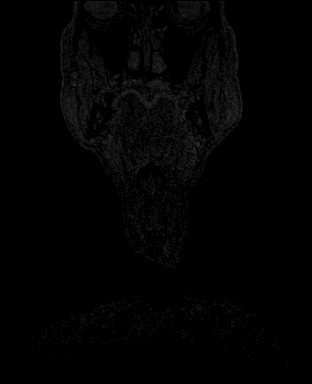
[im 12/94]
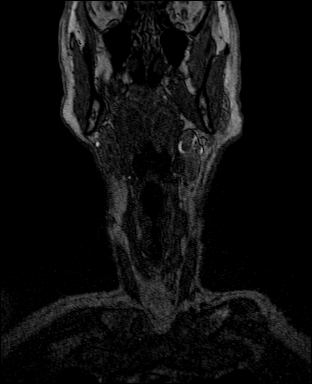
[im 24/94]
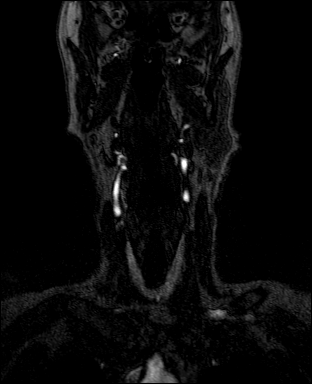
[im 35/94]
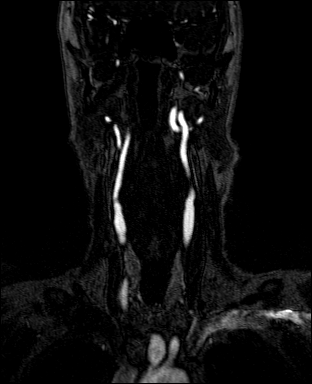
[im 47/94]
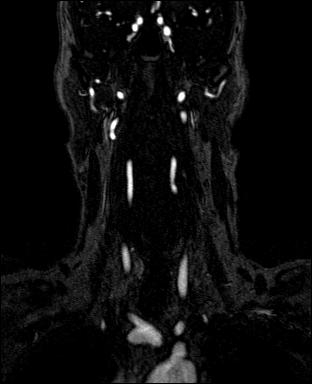
[im 82/94]
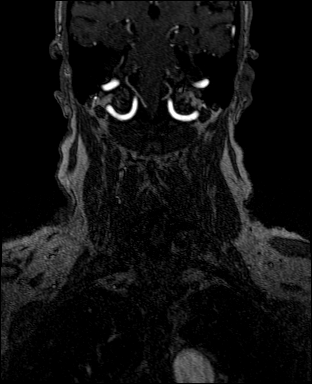

[Series 7: (id)_cor_post_venous · coronal · 0.8mm · 0.78mm/px · 3 of 96 slices shown]
[im 11/96]
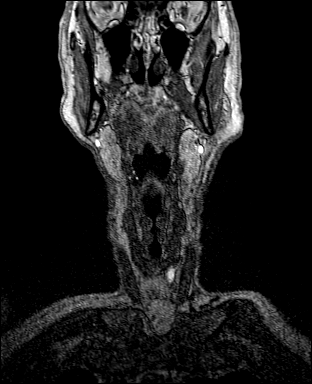
[im 53/96]
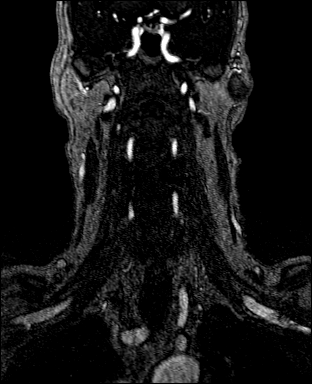
[im 85/96]
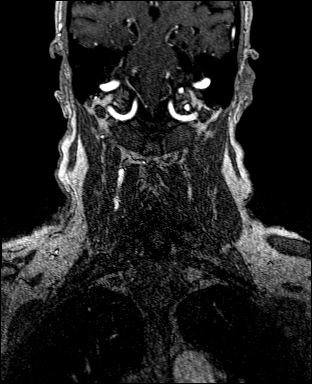

[25 of 48 positions shown; findings below may reference images not displayed]

FINDINGS: MRI HEAD FINDINGS

Brain: Examination mildly limited as no coronal DWI or T2 weighted
sequence is available for review at time of this dictation.

Diffuse prominence of the CSF containing spaces compatible with
generalized cerebral atrophy. Patchy and confluent T2/FLAIR
hyperintensity involving the periventricular and deep white matter
most consistent with chronic small vessel ischemic disease,
mild-to-moderate in nature. Mild patchy involvement of the pons
noted. Few scattered remote lacunar infarcts present about the
bilateral corona radiata/basal ganglia. Evidence for associated
wallerian degeneration extending along the descending white matter
tracts on the left (series 11, images 28-21).

9 mm focus of restricted diffusion involving the subcortical white
matter of the posterior right frontal centrum semi ovale, consistent
with a small acute ischemic infarct (series 5, image 84). No
associated hemorrhage or mass effect. No other evidence for acute or
subacute ischemia. Gray-white matter differentiation otherwise
maintained. No encephalomalacia to suggest chronic cortical
infarction. No acute or chronic intracranial blood products.

No mass lesion, midline shift or mass effect no hydrocephalus or
extra-axial fluid collection. Pituitary gland suprasellar region
normal. Midline structures intact.

Vascular: Major intracranial vascular flow voids are maintained.

Skull and upper cervical spine: Craniocervical junction within
normal limits. Bone marrow signal intensity normal. No scalp soft
tissue abnormality.

Sinuses/Orbits: Globes and orbital soft tissues within normal
limits. Small right maxillary sinus retention cyst. Mild mucosal
thickening noted within the ethmoidal air cells. Trace right mastoid
effusion, of doubtful significance.

Other: None.

MRA HEAD FINDINGS

Anterior circulation: Visualized distal cervical segments of the
internal carotid arteries are widely patent with antegrade flow.
Petrous, cavernous, and supraclinoid segments patent without
stenosis or other abnormality. A1 segments, anterior communicating
complex common anterior cerebral arteries patent bilaterally. Right
A1 segment slightly hypoplastic. No M1 stenosis or occlusion. Normal
MCA bifurcations. No proximal MCA branch occlusion. Distal MCA
branches perfused and symmetric.

Posterior circulation: Both vertebral arteries patent without
stenosis. Both PICA patent. Basilar widely patent to its distal
aspect without stenosis. Basilar tip diffusely ectatic without
aneurysm. Superior cerebral arteries patent bilaterally. Both PCAs
primarily supplied via the basilar well perfused or distal aspects.

Anatomic variants: As above.  No intracranial aneurysm.

MRA NECK FINDINGS

Aortic arch: Visualized aortic arch normal in caliber with normal 3
vessel branching pattern. No stenosis about the origin of the great
vessels.

Right carotid system: Right common carotid artery widely patent
without stenosis. No more than mild for age atheromatous
irregularity about the right carotid bulb without significant
stenosis. Right ICA mildly tortuous but widely patent without
stenosis or dissection.

Left carotid system: Left common carotid artery widely patent
without stenosis. Mild for age atheromatous irregularity about the
left carotid bulb without hemodynamically significant stenosis. Left
ICA tortuous but widely patent distally without stenosis or
dissection.

Vertebral arteries: Both vertebral arteries arise from subclavian
arteries. No proximal subclavian artery stenosis. Vertebral arteries
are largely codominant and widely patent without stenosis or
dissection.

Other: None
IMPRESSION: MRI HEAD IMPRESSION:

1. 9 mm acute ischemic nonhemorrhagic subcortical white matter
infarct involving the posterior right frontal centrum semi ovale. No
associated hemorrhage or mass effect.
2. Underlying age-related cerebral atrophy with mild-to-moderate
chronic small vessel ischemic disease, with a few scattered remote
lacunar infarcts about the bilateral corona radiata/basal ganglia.

MRA HEAD IMPRESSION:

Negative intracranial MRA. No large vessel occlusion or other
emergent finding. No hemodynamically significant or correctable
stenosis. No aneurysm.

MRA NECK IMPRESSION:

Wide patency of both carotid artery systems and vertebral arteries
within the neck. No emergent finding or hemodynamically significant
stenosis.

## 2023-12-13 DIAGNOSIS — G8191 Hemiplegia, unspecified affecting right dominant side: Secondary | ICD-10-CM | POA: Diagnosis not present

## 2023-12-13 DIAGNOSIS — I69351 Hemiplegia and hemiparesis following cerebral infarction affecting right dominant side: Secondary | ICD-10-CM | POA: Diagnosis not present

## 2023-12-23 ENCOUNTER — Ambulatory Visit: Payer: PPO | Admitting: Internal Medicine

## 2023-12-23 ENCOUNTER — Encounter: Payer: Self-pay | Admitting: Internal Medicine

## 2023-12-23 VITALS — BP 128/80 | HR 78 | Temp 97.8°F | Resp 18 | Ht 76.0 in | Wt 178.0 lb

## 2023-12-23 DIAGNOSIS — Z9889 Other specified postprocedural states: Secondary | ICD-10-CM

## 2023-12-23 DIAGNOSIS — I10 Essential (primary) hypertension: Secondary | ICD-10-CM

## 2023-12-23 DIAGNOSIS — I679 Cerebrovascular disease, unspecified: Secondary | ICD-10-CM

## 2023-12-23 DIAGNOSIS — G8110 Spastic hemiplegia affecting unspecified side: Secondary | ICD-10-CM

## 2023-12-23 DIAGNOSIS — Z0000 Encounter for general adult medical examination without abnormal findings: Secondary | ICD-10-CM | POA: Diagnosis not present

## 2023-12-23 DIAGNOSIS — E7800 Pure hypercholesterolemia, unspecified: Secondary | ICD-10-CM

## 2023-12-23 DIAGNOSIS — F17210 Nicotine dependence, cigarettes, uncomplicated: Secondary | ICD-10-CM | POA: Diagnosis not present

## 2023-12-23 DIAGNOSIS — E1129 Type 2 diabetes mellitus with other diabetic kidney complication: Secondary | ICD-10-CM

## 2023-12-23 DIAGNOSIS — E1159 Type 2 diabetes mellitus with other circulatory complications: Secondary | ICD-10-CM | POA: Diagnosis not present

## 2023-12-23 DIAGNOSIS — Z1211 Encounter for screening for malignant neoplasm of colon: Secondary | ICD-10-CM

## 2023-12-23 DIAGNOSIS — R809 Proteinuria, unspecified: Secondary | ICD-10-CM | POA: Diagnosis not present

## 2023-12-23 DIAGNOSIS — I2510 Atherosclerotic heart disease of native coronary artery without angina pectoris: Secondary | ICD-10-CM

## 2023-12-23 DIAGNOSIS — N400 Enlarged prostate without lower urinary tract symptoms: Secondary | ICD-10-CM

## 2023-12-23 DIAGNOSIS — W19XXXA Unspecified fall, initial encounter: Secondary | ICD-10-CM | POA: Insufficient documentation

## 2023-12-23 DIAGNOSIS — Z6821 Body mass index (BMI) 21.0-21.9, adult: Secondary | ICD-10-CM

## 2023-12-23 MED ORDER — CLOPIDOGREL BISULFATE 75 MG PO TABS: 75.0000 mg | ORAL_TABLET | Freq: Every day | ORAL | 3 refills | Status: AC

## 2023-12-23 MED ORDER — GLIPIZIDE ER 2.5 MG PO TB24: 2.5000 mg | ORAL_TABLET | Freq: Every day | ORAL | 1 refills | Status: DC

## 2023-12-23 MED ORDER — CARVEDILOL 12.5 MG PO TABS: 12.5000 mg | ORAL_TABLET | Freq: Two times a day (BID) | ORAL | 1 refills | Status: DC

## 2023-12-23 NOTE — Progress Notes (Signed)
 Preventive Screening-Counseling & Management    Jacob Short is a 78 year old Caucasian/White male who presents for his annual wellness exam. This patient's past medical history Benign Prostatic Hypertrophy, CAD, Cerebrovascular Disease, Diabetes Mellitus, Type II, ED, Hyperlipidemia, Hypertension, Kidney Stones, and Tobacco Abuse.    He had a dilated eye exam by Dr. Precious Bard on 07/17/2023 where he did not have diabetic retinopathy but he has a cataract and they are following him for macular degeneration.  He cataract surgery to both eyes at the end of 2024.  He denies any problems with his vision today.   His last colonoscopy was >10 years ago and was normal (done in [place]). We referred him for colonoscopy in 2022 and he declined it.  I did a cologuard on 12/07/2021 and it was positive.  We referred him to GI with Dr. Jennye Boroughs but he states he did not go for this appointment.  We referred him again last year but he never went.  He has a history of BPH with slightly enlarged prostate but no nodules were found. He denies any problems with urination. The patient is exercising doing his exercises therapy taught him after his stroke.   The patient does get yearly flu vaccines. He has had a pneumovax 23 and shingles vaccine was done about 2016. He had his prevnar 13 vaccine in 2017. He is not interested in the RSV vaccine.  He has had 2 COVID-19 vaccines but no boosters. The patient denies any depression, anxiety, or memory loss. He is on an ASA and plavix.   The patient is a 78 year old Caucasian/White male who returns for a follow-up visit for his T2 diabetes.  On his last visit, his A1c was elevated and started him on glipizide XL 2.5mg  daily.  This past year, we increased his farxiga from 5mg  to 10mg  daily.  Since his last visit, he states he has not had any problems.  He is no longer on Trulicity as he ran in his donut hole.  He remains on metformin 2000 mg daily dose, glipizide XL 2.5mg  daily,  and farxiga 10mg  daily.  He specifically denies chest pain, shortness of breath, unexplained fatigue, unexplained abdominal pain, nausea or vomiting, and documented hypoglycemia. He is not regularly checking his FSBS.  He came in fasting today in anticipation of lab work. His last HgbA1c was done 3 months ago and was 6.5%.  He does not have a history of diabetic retinopathy, neuropathy or nephropathy noted.  He does have a history of CAD with stent placement as described above.  He had some minor microalbuminuria this past year but again he is on farxiga and an ARB.   He had a dilated eye exam by Dr. Precious Bard on 07/17/2023 where he did not have diabetic retinopathy.  The patient is a 78 year old Caucasian/White male who presents for a follow-up evaluation of hypertension.  This past year, his BP was controlled and we increased his losartan from 25mg  daily to 50mg  daily.  The patient has not been checking his blood pressure at home. The patient's current medications include: carvedilol 12.5 mg once daily and losaratan 50mg  daily. The patient has been tolerating his medications well. The patient denies any dizziness, lightheadness, chest pain, shortness of breath, weakness/numbness, and edema. His last eye exam was 9 months ago. He reports there have been no other symptoms noted.   Jacob Short returns today for routine followup on his cholesterol. In 2020, we changed his  lipitor to crestor. Overall, he states he is doing well and is without any complaints or problems at this time. In 11/2022, his LDL was 47.  He specifically denies abdominal pain, nausea, vomiting, diarrhea, myalgias, and fatigue. He remains on dietary management as well as the following cholesterol lowering medications Crestor 10 mg qhs. He is fasting in anticipation for labs today.    Jacob Short is a 78 yo gentleman who has a history of cerebrovascular disease where he had a stroke in 05/2020.  He was hospitalized at that time where his  workup in the hospital demonstrated that he had a left corona radiata infarct secondary to small vessel disease.  They placed him on a statin as well as ASA 325mg  daily and Plavix 75mg  daily.  The patient has noted that his right sided weakness/numbness where he has seen neurology as well as therapies.  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  He denies any residual sequalae and denies any vision problems, cognition/memory problems, or speech problems.  Jacob Short has had problems with stability with walking and problems with agility.  Unfortunately in 05/2021 he developed pyelonephritis where he was hospitalized.  Before this, he was doing well with his therapies but his weakness worsened with lying in bed while in the hospital.  Again, he was able to lie in bed before he was admitted and he walking on his own but he did use a cane in public. He then had to start using a rolling walker and he has noticed his walking ability has significantly been effected.  He was no longer able to get into and out of bed and has been sleeping in a recliner.  He has had stiffness of his right arm which is sequalae of his stroke where he has right arm spastic hemiplegia. He also saw Dr. Allena Katz in PM&R who started him on a course of baclofen.  He received therapy for his arm/shoulder and [facility] was doing botox injections every 12 weeks for a frozen shoulder.  His last Botox injection was in 10/2022.  He has been receiving botox injections for right spastic hemiparesis.  He continues Baclofen 5 mg BID and Robaxin 750 mg 2-3 times per day. The Robaxin helps right leg and arm muscle spasms at night.  He ambulates with a cane for short distances in both the indoors and outdoors.   In 2022, he participated in transcranial direct current stimulation for post-stroke motor recovery study and he had a good response.  He states when they go to the store or vacation, he cannot travel long distances due to  his right spastic hemiplegia and he was written for a wheelchair.  The patient tells me that he had an accidental fall about 3 weeks ago.  He was trying to get out of his chair and he lost his balance.  His right foot is bruised.  He has noticed worsening spasms of his right arm and he states he is more weak.  He stopped using a cane since the fall and is now using a rolling walker.   Jacob Short is a 78 yo gentleman who has moderately severe CAD of about 10 years known duration and returns for followup.  He is followed by Dr. Dulce Sellar with his last visit in 09/04/2023.  He felt that his CAD was stable. He is going to see him yearly.  The patient did have a carotid doppler US done in  12/09/2022 which showed no evidence of stenosis in the right ICA or left ICA.  The patient was diagnosed with CAD in 2003 where he was having chest pain and was sent to [facility] where they did cardiac cath and found he had 2 lesions.  He had drug eluting stents placed in the proximal and mid RCA at that time.  He remains on ASA and plavix.  His previous heart catherization was around 2003 where he had one stent placed. He has never had a MI. The patient did see cardiology in 07/2011 where they did a stress test and this was positive for anterior wall ischemia with an EF 51%. He went to [facility] for an elective hearth catherization.  He tells me they found mild blockage at that time but he did not need intervention and they decided to go with medical management.  His CAD is controlled with therapy as summarized in the medication list and previous notes.  The patient has no comorbid conditions. He has no baseline symptoms of CAD. He has the following modifiable risk factor(s): smoking, HTN, DM, and hyperlipidemia.  Specifically denied complaint(s): chest pain, palpitations, orthopnea, edema, exertional dyspnea, and syncope.       Jacob Short was having problems with kidney stones in 08/2016.  I sent him for a CT urogram where we  discovered incidentally that he had an AAA.  He had an infrarenal AAA measuring up to 5cm.  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  He went to vascular surgery for repair which was done in 2018.  He was referred to cardiology for nuclear stress testing and he states this was normal. He has had lithotripsy for his kidney stones in the past.  Again, he was admitted as above on 05/2021 where he was diagnosed with obstructing ureteral stones.  He was placed on IV antibiotics and underwent a cystoscopy on 9/15 where they removed stones.  He was noted to have some mild hydronephrosis.  He has not had any other kidney stones since then.    The patient used to be on cialis for ED but has not taken this in a while.  Again, he has a history of tobacco and smokes about a ppd.  He did smoke about 1 ppd for at least 30 years.  He quit on 06/15/2020 on the day of his stroke but he restarted smoking in 2022.       Are there smokers in your home (other than you)? Yes  Risk Factors Current exercise habits:  as above   Dietary issues discussed: none   Depression Screen (Note: if answer to either of the following is "Yes", a more complete depression screening is indicated)   Over the past two weeks, have you felt down, depressed or hopeless? No  Over the past two weeks, have you felt little interest or pleasure in doing things? No  Have you lost interest or pleasure in daily life? No  Do you often feel hopeless? No  Do you cry easily over simple problems? No  Activities of Daily Living In your present state of health, do you have any difficulty performing the following activities?:  Driving? Yes Managing money?  No Feeding yourself? No Getting from bed to chair? No Climbing a flight of stairs? Yes Preparing food and eating?: Yes Bathing or showering?  Yes Getting  dressed: No Getting to the toilet? No Using the toilet:No Moving around from place to place: Yes In the past year have you fallen or had a near fall?:Yes   Are you sexually active?  Yes  Do you have more than one partner?  No  Hearing Difficulties: Yes- he has hearing aids Do you often ask people to speak up or repeat themselves? Yes Do you experience ringing or noises in your ears? No Do you have difficulty understanding soft or whispered voices? Yes   Do you feel that you have a problem with memory? No  Do you often misplace items? No  Do you feel safe at home?  Yes  Cognitive Testing  Alert? Yes  Normal Appearance?Yes  Oriented to person? Yes  Place? Yes   Time? Yes  Recall of three objects?  Yes  Can perform simple calculations? Yes  Displays appropriate judgment?Yes  Can read the correct time from a watch face?Yes  He scored a 29/30 on his MMSE  Fall Risk Prevention  Any stairs in or around the home? Yes  If so, are there any without handrails? Yes  Home free of loose throw rugs in walkways, pet beds, electrical cords, etc? Yes  Adequate lighting in your home to reduce risk of falls? Yes  Use of a cane, walker or w/c? Yes    Time Up and Go  Was the test performed? No .      Advanced Directives have been discussed with the patient? Yes   List the Names of Other Physician/Practitioners you currently use: Patient Care Team: Crist Fat, MD as PCP - General (Internal Medicine)    Past Medical History:  Diagnosis Date   BPH (benign prostatic hyperplasia)    Cerebrovascular disease    Coronary artery disease    History of kidney stones    Hyperlipidemia    Hypertension    Peripheral vascular disease (HCC)    T2DM (type 2 diabetes mellitus) (HCC)    Tobacco abuse     Past Surgical History:  Procedure Laterality Date   CORONARY ANGIOPLASTY        Current Medications  Current Outpatient Medications  Medication Sig Dispense Refill   aspirin EC  325 MG tablet Take 1 tablet (325 mg total) by mouth daily. 30 tablet 0   Baclofen 5 MG TABS TAKE 1 TABLET BY MOUTH EVERY MORNING, 1 TABLET AT NOON, AND 1 TABLET EVERY NIGHT AT BEDTIME (Patient taking differently: Take 1 tablet by mouth 3 (three) times daily.) 90 tablet 3   carvedilol (COREG) 12.5 MG tablet TAKE 1 TABLET(12.5 MG) BY MOUTH TWICE DAILY WITH A MEAL (Patient taking differently: Take 12.5 mg by mouth 2 (two) times daily with a meal.) 120 tablet 1   Cholecalciferol (VITAMIN D) 50 MCG (2000 UT) tablet Take 2,000 Units by mouth daily.     clopidogrel (PLAVIX) 75 MG tablet TAKE 1 TABLET(75 MG) BY MOUTH DAILY (Patient taking differently: Take 75 mg by mouth daily.) 30 tablet 5   dapagliflozin propanediol (FARXIGA) 10 MG TABS tablet Take 10 mg by mouth daily.     glipiZIDE (GLUCOTROL XL) 2.5 MG 24 hr tablet TAKE 1 TABLET(2.5 MG) BY MOUTH DAILY WITH BREAKFAST 30 tablet 1   losartan (COZAAR) 50 MG tablet TAKE 1 TABLET(50 MG) BY MOUTH DAILY 90 tablet 0   metFORMIN (GLUCOPHAGE-XR) 500 MG 24 hr tablet TAKE 2 TABLETS(1000 MG) BY MOUTH TWICE DAILY WITH A MEAL 270 tablet 1  methocarbamol (ROBAXIN) 500 MG tablet TAKE 1 TABLET(500 MG) BY MOUTH EVERY 6 HOURS AS NEEDED FOR MUSCLE SPASMS (Patient taking differently: Take 500 mg by mouth every 6 (six) hours as needed for muscle spasms.) 270 tablet 1   nitroGLYCERIN (NITROSTAT) 0.4 MG SL tablet Place under the tongue.     rosuvastatin (CRESTOR) 10 MG tablet TAKE 1 TABLET(10 MG) BY MOUTH DAILY 90 tablet 1   No current facility-administered medications for this visit.    Allergies Lisinopril   Social History Social History   Tobacco Use   Smoking status: Every Day    Current packs/day: 1.00    Average packs/day: 1 pack/day for 0.2 years (0.2 ttl pk-yrs)    Types: Cigarettes    Start date: 2025    Last attempt to quit: 06/15/2020   Smokeless tobacco: Current  Substance Use Topics   Alcohol use: Not Currently     Review of Systems Review of  Systems  Constitutional:  Negative for chills, fever and malaise/fatigue.  Eyes:  Negative for blurred vision and double vision.  Respiratory:  Negative for cough, hemoptysis, shortness of breath and wheezing.   Cardiovascular:  Negative for chest pain, palpitations, orthopnea and leg swelling.  Gastrointestinal:  Negative for abdominal pain, blood in stool, constipation, diarrhea, heartburn, melena, nausea and vomiting.  Genitourinary:  Negative for frequency and hematuria.  Musculoskeletal:  Negative for myalgias.  Skin:  Negative for itching and rash.  Neurological:  Positive for focal weakness. Negative for dizziness and headaches.  Endo/Heme/Allergies:  Negative for polydipsia.  Psychiatric/Behavioral:  Negative for depression and memory loss. The patient is not nervous/anxious.      Physical Exam:      Body mass index is 21.67 kg/m. BP 128/80 (BP Location: Left Arm, Patient Position: Sitting, Cuff Size: Normal)   Pulse 78   Temp 97.8 F (36.6 C)   Resp 18   Ht 6\' 4"  (1.93 m)   Wt 178 lb (80.7 kg)   SpO2 97%   BMI 21.67 kg/m   Physical Exam Constitutional:      Appearance: Normal appearance. He is not ill-appearing.  HENT:     Head: Normocephalic and atraumatic.     Right Ear: Tympanic membrane, ear canal and external ear normal.     Left Ear: Tympanic membrane, ear canal and external ear normal.     Nose: Nose normal. No congestion or rhinorrhea.     Mouth/Throat:     Mouth: Mucous membranes are dry.     Pharynx: Oropharynx is clear. No oropharyngeal exudate or posterior oropharyngeal erythema.  Eyes:     General: No scleral icterus.    Conjunctiva/sclera: Conjunctivae normal.     Pupils: Pupils are equal, round, and reactive to light.  Neck:     Vascular: No carotid bruit.  Cardiovascular:     Rate and Rhythm: Normal rate and regular rhythm.     Pulses: Normal pulses.     Heart sounds: No murmur heard.    No friction rub. No gallop.  Pulmonary:     Effort:  Pulmonary effort is normal. No respiratory distress.     Breath sounds: No wheezing, rhonchi or rales.  Abdominal:     General: Abdomen is flat. Bowel sounds are normal. There is no distension.     Palpations: Abdomen is soft.     Tenderness: There is no abdominal tenderness.  Musculoskeletal:     Cervical back: Neck supple. No tenderness.     Right lower leg:  No edema.     Left lower leg: No edema.  Lymphadenopathy:     Cervical: No cervical adenopathy.  Skin:    General: Skin is warm and dry.     Findings: No rash.  Neurological:     General: No focal deficit present.     Mental Status: He is alert and oriented to person, place, and time.     Comments: He has spasticity of his right arm and right leg.  Psychiatric:        Mood and Affect: Mood normal.        Behavior: Behavior normal.      Assessment:      Fall, initial encounter  Cerebrovascular disease  Spastic hemiparesis affecting dominant side (HCC)  Type 2 diabetes mellitus with other circulatory complications (HCC)  Microalbuminuria due to type 2 diabetes mellitus (HCC)  Coronary artery disease involving native coronary artery of native heart without angina pectoris  Essential hypertension  Hypercholesterolemia  Benign prostatic hyperplasia without lower urinary tract symptoms  History of AAA (abdominal aortic aneurysm) repair  Cigarette smoker  BMI 21.0-21.9, adult  Encounter for Hemoccult screening    Plan:     During the course of the visit the patient was educated and counseled about appropriate screening and preventive services including:   Pneumococcal vaccine  Influenza vaccine Colorectal cancer screening Smoking cessation counseling Advanced directives: discussed  Diet review for nutrition referral? Yes ____  Not Indicated _X___   Patient Instructions (the written plan) was given to the patient.  No problem-specific Assessment & Plan notes found for this encounter.     Prevention   Medicare Attestation I have personally reviewed: The patient's medical and social history Their use of alcohol, tobacco or illicit drugs Their current medications and supplements The patient's functional ability including ADLs,fall risks, home safety risks, cognitive, and hearing and visual impairment Diet and physical activities Evidence for depression or mood disorders  The patient's weight, height, and BMI have been recorded in the chart.  I have made referrals, counseling, and provided education to the patient based on review of the above and I have provided the patient with a written personalized care plan for preventive services.     Crist Fat, MD   12/23/2023

## 2023-12-24 LAB — CBC WITH DIFFERENTIAL/PLATELET
Basophils Absolute: 0.1 10*3/uL (ref 0.0–0.2)
Basos: 1 %
EOS (ABSOLUTE): 0.3 10*3/uL (ref 0.0–0.4)
Eos: 3 %
Hematocrit: 42.1 % (ref 37.5–51.0)
Hemoglobin: 13.8 g/dL (ref 13.0–17.7)
Immature Grans (Abs): 0 10*3/uL (ref 0.0–0.1)
Immature Granulocytes: 0 %
Lymphocytes Absolute: 1.9 10*3/uL (ref 0.7–3.1)
Lymphs: 19 %
MCH: 29.4 pg (ref 26.6–33.0)
MCHC: 32.8 g/dL (ref 31.5–35.7)
MCV: 90 fL (ref 79–97)
Monocytes Absolute: 1 10*3/uL — ABNORMAL HIGH (ref 0.1–0.9)
Monocytes: 10 %
Neutrophils Absolute: 6.5 10*3/uL (ref 1.4–7.0)
Neutrophils: 67 %
Platelets: 287 10*3/uL (ref 150–450)
RBC: 4.69 x10E6/uL (ref 4.14–5.80)
RDW: 15.1 % (ref 11.6–15.4)
WBC: 9.8 10*3/uL (ref 3.4–10.8)

## 2023-12-24 LAB — HEMOGLOBIN A1C
Est. average glucose Bld gHb Est-mCnc: 143 mg/dL
Hgb A1c MFr Bld: 6.6 % — ABNORMAL HIGH (ref 4.8–5.6)

## 2023-12-24 LAB — LIPID PANEL
Chol/HDL Ratio: 3.3 ratio (ref 0.0–5.0)
Cholesterol, Total: 105 mg/dL (ref 100–199)
HDL: 32 mg/dL — ABNORMAL LOW (ref >39)
LDL Chol Calc (NIH): 52 mg/dL (ref 0–99)
Triglycerides: 117 mg/dL (ref 0–149)
VLDL Cholesterol Cal: 21 mg/dL (ref 5–40)

## 2023-12-24 LAB — CMP14 + ANION GAP
ALT: 7 IU/L (ref 0–44)
AST: 11 IU/L (ref 0–40)
Albumin: 4.3 g/dL (ref 3.8–4.8)
Alkaline Phosphatase: 116 IU/L (ref 44–121)
Anion Gap: 17 mmol/L (ref 10.0–18.0)
BUN/Creatinine Ratio: 15 (ref 10–24)
BUN: 11 mg/dL (ref 8–27)
Bilirubin Total: 0.8 mg/dL (ref 0.0–1.2)
CO2: 18 mmol/L — ABNORMAL LOW (ref 20–29)
Calcium: 9.1 mg/dL (ref 8.6–10.2)
Chloride: 104 mmol/L (ref 96–106)
Creatinine, Ser: 0.75 mg/dL — ABNORMAL LOW (ref 0.76–1.27)
Globulin, Total: 2.6 g/dL (ref 1.5–4.5)
Glucose: 100 mg/dL — ABNORMAL HIGH (ref 70–99)
Potassium: 4.7 mmol/L (ref 3.5–5.2)
Sodium: 139 mmol/L (ref 134–144)
Total Protein: 6.9 g/dL (ref 6.0–8.5)
eGFR: 92 mL/min/{1.73_m2} (ref >59)

## 2023-12-24 LAB — MICROALBUMIN / CREATININE URINE RATIO
Creatinine, Urine: 52.5 mg/dL
Microalb/Creat Ratio: 30 mg/g{creat} — ABNORMAL HIGH (ref 0–29)
Microalbumin, Urine: 15.6 ug/mL

## 2023-12-24 LAB — TSH: TSH: 2.99 u[IU]/mL (ref 0.450–4.500)

## 2023-12-24 LAB — PSA: Prostate Specific Ag, Serum: 1 ng/mL (ref 0.0–4.0)

## 2023-12-26 NOTE — Progress Notes (Signed)
 His labs look excellent. Continue on all his meds.  Patient aware of labs

## 2023-12-29 ENCOUNTER — Ambulatory Visit

## 2024-01-01 ENCOUNTER — Ambulatory Visit

## 2024-01-01 DIAGNOSIS — H6120 Impacted cerumen, unspecified ear: Secondary | ICD-10-CM

## 2024-01-01 NOTE — Progress Notes (Signed)
 Nurse Visit- Ear wash

## 2024-01-07 ENCOUNTER — Other Ambulatory Visit: Payer: Self-pay

## 2024-01-07 MED ORDER — METFORMIN HCL ER 500 MG PO TB24: ORAL_TABLET | ORAL | 1 refills | Status: DC

## 2024-01-07 MED ORDER — METHOCARBAMOL 500 MG PO TABS: 500.0000 mg | ORAL_TABLET | Freq: Four times a day (QID) | ORAL | 2 refills | Status: AC | PRN

## 2024-01-07 NOTE — Progress Notes (Signed)
 Rx Refill

## 2024-01-07 NOTE — Progress Notes (Signed)
 Rx refill

## 2024-01-13 DIAGNOSIS — G8191 Hemiplegia, unspecified affecting right dominant side: Secondary | ICD-10-CM | POA: Diagnosis not present

## 2024-01-13 DIAGNOSIS — I69351 Hemiplegia and hemiparesis following cerebral infarction affecting right dominant side: Secondary | ICD-10-CM | POA: Diagnosis not present

## 2024-01-20 DIAGNOSIS — G8110 Spastic hemiplegia affecting unspecified side: Secondary | ICD-10-CM | POA: Diagnosis not present

## 2024-01-28 DIAGNOSIS — Z7409 Other reduced mobility: Secondary | ICD-10-CM | POA: Diagnosis not present

## 2024-01-28 DIAGNOSIS — M2560 Stiffness of unspecified joint, not elsewhere classified: Secondary | ICD-10-CM | POA: Diagnosis not present

## 2024-01-28 DIAGNOSIS — M25661 Stiffness of right knee, not elsewhere classified: Secondary | ICD-10-CM | POA: Diagnosis not present

## 2024-01-28 DIAGNOSIS — M25662 Stiffness of left knee, not elsewhere classified: Secondary | ICD-10-CM | POA: Diagnosis not present

## 2024-01-28 DIAGNOSIS — M25461 Effusion, right knee: Secondary | ICD-10-CM | POA: Diagnosis not present

## 2024-01-28 DIAGNOSIS — M25471 Effusion, right ankle: Secondary | ICD-10-CM | POA: Diagnosis not present

## 2024-01-28 DIAGNOSIS — G8110 Spastic hemiplegia affecting unspecified side: Secondary | ICD-10-CM | POA: Diagnosis not present

## 2024-01-28 DIAGNOSIS — R293 Abnormal posture: Secondary | ICD-10-CM | POA: Diagnosis not present

## 2024-01-28 DIAGNOSIS — R2681 Unsteadiness on feet: Secondary | ICD-10-CM | POA: Diagnosis not present

## 2024-01-28 DIAGNOSIS — M6281 Muscle weakness (generalized): Secondary | ICD-10-CM | POA: Diagnosis not present

## 2024-01-30 ENCOUNTER — Other Ambulatory Visit: Payer: Self-pay | Admitting: *Deleted

## 2024-01-30 DIAGNOSIS — Z9889 Other specified postprocedural states: Secondary | ICD-10-CM

## 2024-02-09 ENCOUNTER — Ambulatory Visit (HOSPITAL_COMMUNITY): Admitting: Surgery

## 2024-02-09 ENCOUNTER — Ambulatory Visit (HOSPITAL_COMMUNITY)

## 2024-02-12 DIAGNOSIS — G8191 Hemiplegia, unspecified affecting right dominant side: Secondary | ICD-10-CM | POA: Diagnosis not present

## 2024-02-12 DIAGNOSIS — I69351 Hemiplegia and hemiparesis following cerebral infarction affecting right dominant side: Secondary | ICD-10-CM | POA: Diagnosis not present

## 2024-02-17 ENCOUNTER — Other Ambulatory Visit: Payer: Self-pay | Admitting: Internal Medicine

## 2024-02-23 ENCOUNTER — Other Ambulatory Visit: Payer: Self-pay

## 2024-02-23 MED ORDER — METFORMIN HCL ER 500 MG PO TB24: ORAL_TABLET | ORAL | 1 refills | Status: DC

## 2024-03-02 DIAGNOSIS — M6281 Muscle weakness (generalized): Secondary | ICD-10-CM | POA: Diagnosis not present

## 2024-03-02 DIAGNOSIS — G8110 Spastic hemiplegia affecting unspecified side: Secondary | ICD-10-CM | POA: Diagnosis not present

## 2024-03-02 DIAGNOSIS — M25471 Effusion, right ankle: Secondary | ICD-10-CM | POA: Diagnosis not present

## 2024-03-02 DIAGNOSIS — M2560 Stiffness of unspecified joint, not elsewhere classified: Secondary | ICD-10-CM | POA: Diagnosis not present

## 2024-03-02 DIAGNOSIS — M25662 Stiffness of left knee, not elsewhere classified: Secondary | ICD-10-CM | POA: Diagnosis not present

## 2024-03-02 DIAGNOSIS — R2681 Unsteadiness on feet: Secondary | ICD-10-CM | POA: Diagnosis not present

## 2024-03-02 DIAGNOSIS — Z7409 Other reduced mobility: Secondary | ICD-10-CM | POA: Diagnosis not present

## 2024-03-02 DIAGNOSIS — M25461 Effusion, right knee: Secondary | ICD-10-CM | POA: Diagnosis not present

## 2024-03-02 DIAGNOSIS — M25661 Stiffness of right knee, not elsewhere classified: Secondary | ICD-10-CM | POA: Diagnosis not present

## 2024-03-07 ENCOUNTER — Other Ambulatory Visit: Payer: Self-pay | Admitting: Internal Medicine

## 2024-03-22 ENCOUNTER — Ambulatory Visit: Admitting: Internal Medicine

## 2024-03-25 DIAGNOSIS — R2681 Unsteadiness on feet: Secondary | ICD-10-CM | POA: Diagnosis not present

## 2024-03-25 DIAGNOSIS — Z7409 Other reduced mobility: Secondary | ICD-10-CM | POA: Diagnosis not present

## 2024-03-25 DIAGNOSIS — R293 Abnormal posture: Secondary | ICD-10-CM | POA: Diagnosis not present

## 2024-03-25 DIAGNOSIS — G8110 Spastic hemiplegia affecting unspecified side: Secondary | ICD-10-CM | POA: Diagnosis not present

## 2024-03-25 DIAGNOSIS — M25662 Stiffness of left knee, not elsewhere classified: Secondary | ICD-10-CM | POA: Diagnosis not present

## 2024-03-25 DIAGNOSIS — M2560 Stiffness of unspecified joint, not elsewhere classified: Secondary | ICD-10-CM | POA: Diagnosis not present

## 2024-03-25 DIAGNOSIS — M6281 Muscle weakness (generalized): Secondary | ICD-10-CM | POA: Diagnosis not present

## 2024-03-25 DIAGNOSIS — R2689 Other abnormalities of gait and mobility: Secondary | ICD-10-CM | POA: Diagnosis not present

## 2024-03-25 DIAGNOSIS — M25461 Effusion, right knee: Secondary | ICD-10-CM | POA: Diagnosis not present

## 2024-03-25 DIAGNOSIS — M25471 Effusion, right ankle: Secondary | ICD-10-CM | POA: Diagnosis not present

## 2024-03-25 DIAGNOSIS — M25661 Stiffness of right knee, not elsewhere classified: Secondary | ICD-10-CM | POA: Diagnosis not present

## 2024-03-27 ENCOUNTER — Other Ambulatory Visit: Payer: Self-pay | Admitting: Internal Medicine

## 2024-04-02 ENCOUNTER — Ambulatory Visit: Admitting: Internal Medicine

## 2024-04-16 ENCOUNTER — Ambulatory Visit: Admitting: Internal Medicine

## 2024-04-16 ENCOUNTER — Encounter: Payer: Self-pay | Admitting: Internal Medicine

## 2024-04-16 VITALS — BP 118/74 | HR 63 | Temp 98.2°F | Resp 17 | Ht 76.0 in | Wt 179.8 lb

## 2024-04-16 DIAGNOSIS — E1159 Type 2 diabetes mellitus with other circulatory complications: Secondary | ICD-10-CM

## 2024-04-16 DIAGNOSIS — R809 Proteinuria, unspecified: Secondary | ICD-10-CM

## 2024-04-16 DIAGNOSIS — E1129 Type 2 diabetes mellitus with other diabetic kidney complication: Secondary | ICD-10-CM

## 2024-04-16 MED ORDER — DAPAGLIFLOZIN PROPANEDIOL 10 MG PO TABS: 10.0000 mg | ORAL_TABLET | Freq: Every day | ORAL | 3 refills | Status: AC

## 2024-04-16 NOTE — Assessment & Plan Note (Signed)
Continue his current medications;

## 2024-04-16 NOTE — Assessment & Plan Note (Signed)
 We will check a HgBa1c on him at this time.  His goal will be to have an A1c <6.5% if possible to reduce microvascular disease.

## 2024-04-16 NOTE — Progress Notes (Signed)
 Office Visit  Subjective   Patient ID: Jacob Short   DOB: 05-09-1946   Age: 78 y.o.   MRN: [id number]   Chief Complaint Chief Complaint  Patient presents with   Follow-up     History of Present Illness The patient is a 78 year old Caucasian/White male who returns for a follow-up visit for his T2 diabetes.  On his last visit, his A1c was controlled but this past year he did have some elevation where we started him on glipizide  XL 2.5mg  daily.  This past year, we increased his farxiga from 5mg  to 10mg  daily.  Since his last visit, he states he has not had any problems.  He is no longer on Trulicity as he ran in his donut hole.  He remains on metformin  2000 mg daily dose, glipizide  XL 2.5mg  daily, and farxiga 10mg  daily.  He specifically denies chest pain, shortness of breath, unexplained fatigue, unexplained abdominal pain, nausea or vomiting, and documented hypoglycemia. He is not regularly checking his FSBS.  He came in fasting today in anticipation of lab work. His last HgbA1c was done 3 months ago and was 6.6%.  He does not have a history of diabetic retinopathy, neuropathy or nephropathy noted.  He does have a history of CAD with stent placement as described above.  He had some minor microalbuminuria this past year but again he is on farxiga and an ARB.   He had a dilated eye exam by Dr. Erasmo on 07/17/2023 where he did not have diabetic retinopathy.     Past Medical History Past Medical History:  Diagnosis Date   BPH (benign prostatic hyperplasia)    Cerebrovascular disease    Coronary artery disease    History of kidney stones    Hyperlipidemia    Hypertension    Peripheral vascular disease (HCC)    T2DM (type 2 diabetes mellitus) (HCC)    Tobacco abuse      Allergies Allergies  Allergen Reactions   Lisinopril Swelling     Medications  Current Outpatient Medications:    aspirin  EC 325 MG tablet, Take 1 tablet (325 mg total) by mouth daily., Disp: 30 tablet, Rfl:  0   Baclofen  5 MG TABS, TAKE 1 TABLET BY MOUTH EVERY MORNING, 1 TABLET AT NOON, AND 1 TABLET EVERY NIGHT AT BEDTIME, Disp: 90 tablet, Rfl: 3   carvedilol  (COREG ) 12.5 MG tablet, Take 1 tablet (12.5 mg total) by mouth 2 (two) times daily with a meal. TAKE 1 TABLET(12.5 MG) BY MOUTH TWICE DAILY WITH A MEAL, Disp: 120 tablet, Rfl: 1   Cholecalciferol (VITAMIN D) 50 MCG (2000 UT) tablet, Take 2,000 Units by mouth daily., Disp: , Rfl:    clopidogrel  (PLAVIX ) 75 MG tablet, Take 1 tablet (75 mg total) by mouth daily. TAKE 1 TABLET(75 MG) BY MOUTH DAILY, Disp: 90 tablet, Rfl: 3   dapagliflozin propanediol (FARXIGA) 10 MG TABS tablet, Take 10 mg by mouth daily., Disp: , Rfl:    glipiZIDE  (GLUCOTROL  XL) 2.5 MG 24 hr tablet, Take 1 tablet (2.5 mg total) by mouth daily with breakfast., Disp: 90 tablet, Rfl: 1   losartan  (COZAAR ) 50 MG tablet, TAKE 1 TABLET(50 MG) BY MOUTH DAILY, Disp: 90 tablet, Rfl: 0   metFORMIN  (GLUCOPHAGE -XR) 500 MG 24 hr tablet, TAKE 2 TABLETS(1000 MG) BY MOUTH TWICE DAILY WITH A MEAL, Disp: 270 tablet, Rfl: 1   methocarbamol  (ROBAXIN ) 500 MG tablet, Take 1 tablet (500 mg total) by mouth every 6 (six) hours as  needed for muscle spasms., Disp: 270 tablet, Rfl: 2   nitroGLYCERIN (NITROSTAT) 0.4 MG SL tablet, Place under the tongue., Disp: , Rfl:    rosuvastatin  (CRESTOR ) 10 MG tablet, TAKE 1 TABLET(10 MG) BY MOUTH DAILY, Disp: 90 tablet, Rfl: 1   Review of Systems Review of Systems  Constitutional:  Negative for chills, fever, malaise/fatigue and weight loss.  Eyes:  Negative for blurred vision and double vision.  Respiratory:  Negative for cough and shortness of breath.   Cardiovascular:  Negative for chest pain, palpitations and leg swelling.  Gastrointestinal:  Negative for abdominal pain, constipation, diarrhea, heartburn, nausea and vomiting.  Genitourinary:  Negative for frequency.  Musculoskeletal:  Negative for myalgias.  Skin:  Negative for itching and rash.  Neurological:   Negative for dizziness, weakness and headaches.  Endo/Heme/Allergies:  Negative for polydipsia.       Objective:    Vitals BP 118/74   Pulse 63   Temp 98.2 F (36.8 C)   Resp 17   Ht 6' 4 (1.93 m)   Wt 179 lb 12.8 oz (81.6 kg)   SpO2 98%   BMI 21.89 kg/m    Physical Examination Physical Exam Constitutional:      Appearance: Normal appearance. He is not ill-appearing.  Cardiovascular:     Rate and Rhythm: Normal rate and regular rhythm.     Pulses: Normal pulses.     Heart sounds: No murmur heard.    No friction rub. No gallop.  Pulmonary:     Effort: Pulmonary effort is normal. No respiratory distress.     Breath sounds: No wheezing, rhonchi or rales.  Abdominal:     General: Abdomen is flat. Bowel sounds are normal. There is no distension.     Palpations: Abdomen is soft.     Tenderness: There is no abdominal tenderness.  Musculoskeletal:     Right lower leg: No edema.     Left lower leg: No edema.  Skin:    General: Skin is warm and dry.     Findings: No rash.  Neurological:     Mental Status: He is alert.        Assessment & Plan:   Type 2 diabetes mellitus with other circulatory complications (HCC) We will check a HgBa1c on him at this time.  His goal will be to have an A1c <6.5% if possible to reduce microvascular disease.  Microalbuminuria due to type 2 diabetes mellitus (HCC) Continue his current medications.    Return in about 3 months (around 07/17/2024).   Selinda Fleeta Finger, MD

## 2024-04-17 LAB — HEMOGLOBIN A1C
Est. average glucose Bld gHb Est-mCnc: 146 mg/dL
Hgb A1c MFr Bld: 6.7 % — ABNORMAL HIGH (ref 4.8–5.6)

## 2024-04-26 ENCOUNTER — Ambulatory Visit: Payer: Self-pay

## 2024-04-26 DIAGNOSIS — M25661 Stiffness of right knee, not elsewhere classified: Secondary | ICD-10-CM | POA: Diagnosis not present

## 2024-04-26 DIAGNOSIS — Z7409 Other reduced mobility: Secondary | ICD-10-CM | POA: Diagnosis not present

## 2024-04-26 DIAGNOSIS — M25461 Effusion, right knee: Secondary | ICD-10-CM | POA: Diagnosis not present

## 2024-04-26 DIAGNOSIS — M6281 Muscle weakness (generalized): Secondary | ICD-10-CM | POA: Diagnosis not present

## 2024-04-26 DIAGNOSIS — G8110 Spastic hemiplegia affecting unspecified side: Secondary | ICD-10-CM | POA: Diagnosis not present

## 2024-04-26 DIAGNOSIS — M25471 Effusion, right ankle: Secondary | ICD-10-CM | POA: Diagnosis not present

## 2024-04-26 DIAGNOSIS — R2681 Unsteadiness on feet: Secondary | ICD-10-CM | POA: Diagnosis not present

## 2024-04-26 DIAGNOSIS — R293 Abnormal posture: Secondary | ICD-10-CM | POA: Diagnosis not present

## 2024-04-26 DIAGNOSIS — M2560 Stiffness of unspecified joint, not elsewhere classified: Secondary | ICD-10-CM | POA: Diagnosis not present

## 2024-04-26 DIAGNOSIS — M25662 Stiffness of left knee, not elsewhere classified: Secondary | ICD-10-CM | POA: Diagnosis not present

## 2024-04-26 DIAGNOSIS — R2689 Other abnormalities of gait and mobility: Secondary | ICD-10-CM | POA: Diagnosis not present

## 2024-04-26 NOTE — Progress Notes (Signed)
 Patient called.  Patient aware.  I have called and informed the patients wife  His A1c is controlled. SABRA  Pts wife is aware.

## 2024-04-27 ENCOUNTER — Other Ambulatory Visit: Payer: Self-pay | Admitting: Internal Medicine

## 2024-05-21 ENCOUNTER — Other Ambulatory Visit: Payer: Self-pay | Admitting: Internal Medicine

## 2024-05-27 DIAGNOSIS — G8110 Spastic hemiplegia affecting unspecified side: Secondary | ICD-10-CM | POA: Diagnosis not present

## 2024-05-27 DIAGNOSIS — Z7409 Other reduced mobility: Secondary | ICD-10-CM | POA: Diagnosis not present

## 2024-05-27 DIAGNOSIS — M25662 Stiffness of left knee, not elsewhere classified: Secondary | ICD-10-CM | POA: Diagnosis not present

## 2024-05-27 DIAGNOSIS — M25461 Effusion, right knee: Secondary | ICD-10-CM | POA: Diagnosis not present

## 2024-05-27 DIAGNOSIS — R293 Abnormal posture: Secondary | ICD-10-CM | POA: Diagnosis not present

## 2024-05-27 DIAGNOSIS — M2560 Stiffness of unspecified joint, not elsewhere classified: Secondary | ICD-10-CM | POA: Diagnosis not present

## 2024-05-27 DIAGNOSIS — R2681 Unsteadiness on feet: Secondary | ICD-10-CM | POA: Diagnosis not present

## 2024-05-27 DIAGNOSIS — R2689 Other abnormalities of gait and mobility: Secondary | ICD-10-CM | POA: Diagnosis not present

## 2024-05-27 DIAGNOSIS — M25661 Stiffness of right knee, not elsewhere classified: Secondary | ICD-10-CM | POA: Diagnosis not present

## 2024-05-27 DIAGNOSIS — M6281 Muscle weakness (generalized): Secondary | ICD-10-CM | POA: Diagnosis not present

## 2024-05-27 DIAGNOSIS — M25471 Effusion, right ankle: Secondary | ICD-10-CM | POA: Diagnosis not present

## 2024-06-02 DIAGNOSIS — M2560 Stiffness of unspecified joint, not elsewhere classified: Secondary | ICD-10-CM | POA: Diagnosis not present

## 2024-06-02 DIAGNOSIS — M25661 Stiffness of right knee, not elsewhere classified: Secondary | ICD-10-CM | POA: Diagnosis not present

## 2024-06-02 DIAGNOSIS — R293 Abnormal posture: Secondary | ICD-10-CM | POA: Diagnosis not present

## 2024-06-02 DIAGNOSIS — R2681 Unsteadiness on feet: Secondary | ICD-10-CM | POA: Diagnosis not present

## 2024-06-02 DIAGNOSIS — M25662 Stiffness of left knee, not elsewhere classified: Secondary | ICD-10-CM | POA: Diagnosis not present

## 2024-06-02 DIAGNOSIS — Z7409 Other reduced mobility: Secondary | ICD-10-CM | POA: Diagnosis not present

## 2024-06-02 DIAGNOSIS — M25471 Effusion, right ankle: Secondary | ICD-10-CM | POA: Diagnosis not present

## 2024-06-02 DIAGNOSIS — R2689 Other abnormalities of gait and mobility: Secondary | ICD-10-CM | POA: Diagnosis not present

## 2024-06-02 DIAGNOSIS — M6281 Muscle weakness (generalized): Secondary | ICD-10-CM | POA: Diagnosis not present

## 2024-06-02 DIAGNOSIS — M25461 Effusion, right knee: Secondary | ICD-10-CM | POA: Diagnosis not present

## 2024-06-02 DIAGNOSIS — G8110 Spastic hemiplegia affecting unspecified side: Secondary | ICD-10-CM | POA: Diagnosis not present

## 2024-06-15 DIAGNOSIS — Z961 Presence of intraocular lens: Secondary | ICD-10-CM | POA: Diagnosis not present

## 2024-06-15 DIAGNOSIS — H43811 Vitreous degeneration, right eye: Secondary | ICD-10-CM | POA: Diagnosis not present

## 2024-06-15 DIAGNOSIS — H353131 Nonexudative age-related macular degeneration, bilateral, early dry stage: Secondary | ICD-10-CM | POA: Diagnosis not present

## 2024-06-23 DIAGNOSIS — Z7409 Other reduced mobility: Secondary | ICD-10-CM | POA: Diagnosis not present

## 2024-06-23 DIAGNOSIS — G8110 Spastic hemiplegia affecting unspecified side: Secondary | ICD-10-CM | POA: Diagnosis not present

## 2024-06-23 DIAGNOSIS — M25461 Effusion, right knee: Secondary | ICD-10-CM | POA: Diagnosis not present

## 2024-06-23 DIAGNOSIS — M25661 Stiffness of right knee, not elsewhere classified: Secondary | ICD-10-CM | POA: Diagnosis not present

## 2024-06-23 DIAGNOSIS — M2560 Stiffness of unspecified joint, not elsewhere classified: Secondary | ICD-10-CM | POA: Diagnosis not present

## 2024-06-23 DIAGNOSIS — R293 Abnormal posture: Secondary | ICD-10-CM | POA: Diagnosis not present

## 2024-06-23 DIAGNOSIS — M6281 Muscle weakness (generalized): Secondary | ICD-10-CM | POA: Diagnosis not present

## 2024-06-23 DIAGNOSIS — R2681 Unsteadiness on feet: Secondary | ICD-10-CM | POA: Diagnosis not present

## 2024-06-23 DIAGNOSIS — M25471 Effusion, right ankle: Secondary | ICD-10-CM | POA: Diagnosis not present

## 2024-06-23 DIAGNOSIS — M25662 Stiffness of left knee, not elsewhere classified: Secondary | ICD-10-CM | POA: Diagnosis not present

## 2024-06-28 ENCOUNTER — Other Ambulatory Visit: Payer: Self-pay | Admitting: Internal Medicine

## 2024-06-28 DIAGNOSIS — E1159 Type 2 diabetes mellitus with other circulatory complications: Secondary | ICD-10-CM

## 2024-07-16 ENCOUNTER — Ambulatory Visit: Admitting: Internal Medicine

## 2024-07-16 ENCOUNTER — Encounter: Payer: Self-pay | Admitting: Internal Medicine

## 2024-07-16 VITALS — BP 144/68 | HR 74 | Temp 97.1°F | Resp 16 | Ht 76.0 in | Wt 182.8 lb

## 2024-07-16 DIAGNOSIS — I10 Essential (primary) hypertension: Secondary | ICD-10-CM

## 2024-07-16 DIAGNOSIS — E782 Mixed hyperlipidemia: Secondary | ICD-10-CM

## 2024-07-16 DIAGNOSIS — Z23 Encounter for immunization: Secondary | ICD-10-CM | POA: Diagnosis not present

## 2024-07-16 DIAGNOSIS — E1159 Type 2 diabetes mellitus with other circulatory complications: Secondary | ICD-10-CM

## 2024-07-16 NOTE — Progress Notes (Signed)
 Office Visit  Subjective   Patient ID: Jacob Short   DOB: 10/03/1945   Age: 78 y.o.   MRN: [id number]   Chief Complaint Chief Complaint  Patient presents with   Follow-up    3 Month follow up     History of Present Illness The patient is a 78 year old Caucasian/White male who returns for a follow-up visit for his T2 diabetes.  This past year, his A1c was controlled but this past year he did have some elevation where we started him on glipizide  XL 2.5mg  daily.  This past year, we increased his farxiga  from 5mg  to 10mg  daily.  Since his last visit, he states he has not had any problems.  He is no longer on Trulicity as he ran in his donut hole.  He remains on metformin  2000 mg daily dose, glipizide  XL 2.5mg  daily, and farxiga  10mg  daily.  He specifically denies chest pain, shortness of breath, unexplained fatigue, unexplained abdominal pain, nausea or vomiting, and documented hypoglycemia. He is not regularly checking his FSBS.  He came in fasting today in anticipation of lab work. His last HgbA1c was done 3 months ago and was 6.7%.  He does not have a history of diabetic retinopathy, neuropathy or nephropathy noted.  He does have a history of CAD with stent placement as described above.  He had some minor microalbuminuria this past year but again he is on farxiga  and an ARB.   He had a dilated eye exam by Dr. Erasmo about a month ago but I do not have this report.  The patient is a 78 year old Caucasian/White male who presents for a follow-up evaluation of hypertension.  This past year, his BP was controlled and we increased his losartan  from 25mg  daily to 50mg  daily.  The patient has not been checking his blood pressure at home. The patient's current medications include: carvedilol  12.5 mg once daily and losaratan 50mg  daily. The patient has been tolerating his medications well. The patient denies any dizziness, lightheadness, chest pain, shortness of breath, weakness/numbness, and edema. He  reports there have been no other symptoms noted.   Jacob Short returns today for routine followup on his cholesterol. In 2020, we changed his lipitor  to crestor . Overall, he states he is doing well and is without any complaints or problems at this time. In 11/2022, his LDL was 47.  He specifically denies abdominal pain, nausea, vomiting, diarrhea, myalgias, and fatigue. He remains on dietary management as well as the following cholesterol lowering medications Crestor  10 mg qhs. He is fasting in anticipation for labs today.        Past Medical History Past Medical History:  Diagnosis Date   BPH (benign prostatic hyperplasia)    Cerebrovascular disease    Coronary artery disease    History of kidney stones    Hyperlipidemia    Hypertension    Peripheral vascular disease    T2DM (type 2 diabetes mellitus) (HCC)    Tobacco abuse      Allergies Allergies  Allergen Reactions   Lisinopril Swelling     Medications  Current Outpatient Medications:    aspirin  EC 325 MG tablet, Take 1 tablet (325 mg total) by mouth daily., Disp: 30 tablet, Rfl: 0   Baclofen  5 MG TABS, TAKE 1 TABLET BY MOUTH EVERY MORNING, 1 TABLET AT NOON, AND 1 TABLET EVERY NIGHT AT BEDTIME, Disp: 90 tablet, Rfl: 3   carvedilol  (COREG ) 12.5 MG tablet, TAKE 1 TABLET(12.5  MG) BY MOUTH TWICE DAILY WITH A MEAL, Disp: 120 tablet, Rfl: 1   Cholecalciferol (VITAMIN D) 50 MCG (2000 UT) tablet, Take 2,000 Units by mouth daily., Disp: , Rfl:    clopidogrel  (PLAVIX ) 75 MG tablet, Take 1 tablet (75 mg total) by mouth daily. TAKE 1 TABLET(75 MG) BY MOUTH DAILY, Disp: 90 tablet, Rfl: 3   dapagliflozin  propanediol (FARXIGA ) 10 MG TABS tablet, Take 1 tablet (10 mg total) by mouth daily., Disp: 90 tablet, Rfl: 3   glipiZIDE  (GLUCOTROL  XL) 2.5 MG 24 hr tablet, TAKE 1 TABLET(2.5 MG) BY MOUTH DAILY WITH BREAKFAST, Disp: 90 tablet, Rfl: 1   losartan  (COZAAR ) 50 MG tablet, TAKE 1 TABLET(50 MG) BY MOUTH DAILY, Disp: 90 tablet, Rfl: 0    metFORMIN  (GLUCOPHAGE -XR) 500 MG 24 hr tablet, TAKE 2 TABLETS(1000 MG) BY MOUTH TWICE DAILY WITH A MEAL, Disp: 270 tablet, Rfl: 1   methocarbamol  (ROBAXIN ) 500 MG tablet, Take 1 tablet (500 mg total) by mouth every 6 (six) hours as needed for muscle spasms., Disp: 270 tablet, Rfl: 2   nitroGLYCERIN (NITROSTAT) 0.4 MG SL tablet, Place under the tongue., Disp: , Rfl:    rosuvastatin  (CRESTOR ) 10 MG tablet, TAKE 1 TABLET(10 MG) BY MOUTH DAILY, Disp: 90 tablet, Rfl: 1   Review of Systems Review of Systems  Constitutional:  Negative for chills, fever and malaise/fatigue.  Eyes:  Negative for blurred vision.  Respiratory:  Negative for cough and shortness of breath.   Cardiovascular:  Negative for chest pain, palpitations and leg swelling.  Gastrointestinal:  Negative for abdominal pain, constipation, diarrhea, heartburn, nausea and vomiting.  Genitourinary:  Negative for frequency.  Musculoskeletal:  Negative for myalgias.  Skin:  Negative for itching and rash.  Neurological:  Negative for dizziness, weakness and headaches.  Endo/Heme/Allergies:  Negative for polydipsia.       Objective:    Vitals BP (!) 144/68   Pulse 74   Temp (!) 97.1 F (36.2 C) (Temporal)   Resp 16   Ht 6' 4 (1.93 m)   Wt 182 lb 12.8 oz (82.9 kg)   SpO2 98%   BMI 22.25 kg/m    Physical Examination Physical Exam Constitutional:      Appearance: Normal appearance. He is not ill-appearing.  Cardiovascular:     Rate and Rhythm: Normal rate and regular rhythm.     Pulses: Normal pulses.     Heart sounds: No murmur heard.    No friction rub. No gallop.  Pulmonary:     Effort: Pulmonary effort is normal. No respiratory distress.     Breath sounds: No wheezing, rhonchi or rales.  Abdominal:     General: Abdomen is flat. Bowel sounds are normal. There is no distension.     Palpations: Abdomen is soft.     Tenderness: There is no abdominal tenderness.  Musculoskeletal:     Right lower leg: No edema.      Left lower leg: No edema.  Skin:    General: Skin is warm and dry.     Findings: No rash.  Neurological:     General: No focal deficit present.     Mental Status: He is alert and oriented to person, place, and time.  Psychiatric:        Mood and Affect: Mood normal.        Behavior: Behavior normal.        Assessment & Plan:   Type 2 diabetes mellitus with other circulatory complications [facility]) He has diabetes  assoicated with a stroke in the past.  We will check his HgBa1c today with goal <7%.  Essential hypertension His BP is a bit borderline.  WE will see what his BP is doing on his next visit and if it remains elevated, we will change his medications at that time.  Hyperlipidemia We will check his FLP at this time with goal LDL <55 with  his history of diabetes and stroke.    Return in about 3 months (around 10/16/2024).   Selinda Fleeta Finger, MD

## 2024-07-16 NOTE — Assessment & Plan Note (Signed)
 We will check his FLP at this time with goal LDL <55 with  his history of diabetes and stroke.

## 2024-07-16 NOTE — Assessment & Plan Note (Signed)
 His BP is a bit borderline.  WE will see what his BP is doing on his next visit and if it remains elevated, we will change his medications at that time.

## 2024-07-16 NOTE — Assessment & Plan Note (Signed)
 He has diabetes assoicated with a stroke in the past.  We will check his HgBa1c today with goal <7%.

## 2024-07-17 LAB — LIPID PANEL
Chol/HDL Ratio: 3.2 ratio (ref 0.0–5.0)
Cholesterol, Total: 96 mg/dL — ABNORMAL LOW (ref 100–199)
HDL: 30 mg/dL — ABNORMAL LOW (ref >39)
LDL Chol Calc (NIH): 41 mg/dL (ref 0–99)
Triglycerides: 146 mg/dL (ref 0–149)
VLDL Cholesterol Cal: 25 mg/dL (ref 5–40)

## 2024-07-17 LAB — CMP14 + ANION GAP
ALT: 8 IU/L (ref 0–44)
AST: 13 IU/L (ref 0–40)
Albumin: 4.2 g/dL (ref 3.8–4.8)
Alkaline Phosphatase: 92 IU/L (ref 47–123)
Anion Gap: 15 mmol/L (ref 10.0–18.0)
BUN/Creatinine Ratio: 14 (ref 10–24)
BUN: 11 mg/dL (ref 8–27)
Bilirubin Total: 0.9 mg/dL (ref 0.0–1.2)
CO2: 22 mmol/L (ref 20–29)
Calcium: 9 mg/dL (ref 8.6–10.2)
Chloride: 104 mmol/L (ref 96–106)
Creatinine, Ser: 0.77 mg/dL (ref 0.76–1.27)
Globulin, Total: 2.5 g/dL (ref 1.5–4.5)
Glucose: 143 mg/dL — ABNORMAL HIGH (ref 70–99)
Potassium: 4.7 mmol/L (ref 3.5–5.2)
Sodium: 141 mmol/L (ref 134–144)
Total Protein: 6.7 g/dL (ref 6.0–8.5)
eGFR: 91 mL/min/1.73 (ref >59)

## 2024-07-17 LAB — HEMOGLOBIN A1C
Est. average glucose Bld gHb Est-mCnc: 143 mg/dL
Hgb A1c MFr Bld: 6.6 % — ABNORMAL HIGH (ref 4.8–5.6)

## 2024-07-19 DIAGNOSIS — Z23 Encounter for immunization: Secondary | ICD-10-CM

## 2024-07-19 DIAGNOSIS — E782 Mixed hyperlipidemia: Secondary | ICD-10-CM | POA: Diagnosis not present

## 2024-07-19 DIAGNOSIS — E1159 Type 2 diabetes mellitus with other circulatory complications: Secondary | ICD-10-CM | POA: Diagnosis not present

## 2024-07-19 DIAGNOSIS — I10 Essential (primary) hypertension: Secondary | ICD-10-CM | POA: Diagnosis not present

## 2024-07-19 NOTE — Addendum Note (Signed)
 Addended by: LENETTA LACKS on: 07/19/2024 09:48 AM   Modules accepted: Orders

## 2024-08-16 ENCOUNTER — Ambulatory Visit: Payer: Self-pay

## 2024-08-16 ENCOUNTER — Other Ambulatory Visit: Payer: Self-pay | Admitting: Internal Medicine

## 2024-08-16 NOTE — Progress Notes (Signed)
 Patient called.  Patient aware.  Per VE  Telll him his labs look good.

## 2024-08-25 ENCOUNTER — Ambulatory Visit: Admitting: Internal Medicine

## 2024-08-25 ENCOUNTER — Encounter: Payer: Self-pay | Admitting: Internal Medicine

## 2024-08-25 VITALS — BP 140/80 | HR 71 | Temp 97.3°F | Resp 16 | Ht 76.0 in | Wt 181.0 lb

## 2024-08-25 DIAGNOSIS — E1142 Type 2 diabetes mellitus with diabetic polyneuropathy: Secondary | ICD-10-CM

## 2024-08-25 DIAGNOSIS — E1159 Type 2 diabetes mellitus with other circulatory complications: Secondary | ICD-10-CM | POA: Diagnosis not present

## 2024-08-25 DIAGNOSIS — I10 Essential (primary) hypertension: Secondary | ICD-10-CM | POA: Diagnosis not present

## 2024-08-25 DIAGNOSIS — H6122 Impacted cerumen, left ear: Secondary | ICD-10-CM | POA: Insufficient documentation

## 2024-08-25 NOTE — Assessment & Plan Note (Signed)
 His BP is still borderline.  We will see what his BP is doing on his next visit.

## 2024-08-25 NOTE — Assessment & Plan Note (Signed)
 We did ear irrigation on the left with good effect.

## 2024-08-25 NOTE — Progress Notes (Signed)
 Office Visit  Subjective   Patient ID: Jacob Short   DOB: 1946-01-27   Age: 78 y.o.   MRN: [id number]   Chief Complaint Chief Complaint  Patient presents with   Follow-up    3 Month follow up     History of Present Illness The patient is a 78 year old Caucasian/White male who returns for a follow-up visit for his T2 diabetes.  This past year, his A1c was controlled but this past year he did have some elevation where we started him on glipizide  XL 2.5mg  daily.  This past year, we increased his farxiga  from 5mg  to 10mg  daily.  Since his last visit, he states he has not had any problems.  He is no longer on Trulicity as he ran in his donut hole.  He remains on metformin  2000 mg daily dose, glipizide  XL 2.5mg  daily, and farxiga  10mg  daily.  He specifically denies chest pain, shortness of breath, unexplained fatigue, unexplained abdominal pain, nausea or vomiting, diarrhea, and documented hypoglycemia. He is not regularly checking his FSBS.  He came in fasting today in anticipation of lab work. His last HgbA1c was done on 07/16/2024 and was 6.6%.  He does not have a history of diabetic retinopathy, neuropathy or nephropathy noted.  He does have a history of CAD with stent placement as described above.  He had some minor microalbuminuria this past year but again he is on farxiga  and an ARB.   He had a dilated eye exam by Dr. Erasmo about a month ago but I do not have this report.   The patient is a 78 year old Caucasian/White male who presents for a follow-up evaluation of hypertension.  On his last visit, his BP was borderline and today he is borderline again.  This past year, his BP was not controlled and we increased his losartan  from 25mg  daily to 50mg  daily.  The patient has not been checking his blood pressure at home. The patient's current medications include: carvedilol  12.5 mg once daily and losaratan 50mg  daily. The patient has been tolerating his medications well. The patient denies any  dizziness, lightheadness, chest pain, shortness of breath, weakness/numbness, and edema. He reports there have been no other symptoms noted.      Past Medical History Past Medical History:  Diagnosis Date   BPH (benign prostatic hyperplasia)    Cerebrovascular disease    Coronary artery disease    History of kidney stones    Hyperlipidemia    Hypertension    Peripheral vascular disease    T2DM (type 2 diabetes mellitus) (HCC)    Tobacco abuse      Allergies Allergies  Allergen Reactions   Lisinopril Swelling     Medications  Current Outpatient Medications:    aspirin  EC 325 MG tablet, Take 1 tablet (325 mg total) by mouth daily., Disp: 30 tablet, Rfl: 0   Baclofen  5 MG TABS, TAKE 1 TABLET BY MOUTH EVERY MORNING, 1 TABLET AT NOON, AND 1 TABLET EVERY NIGHT AT BEDTIME, Disp: 90 tablet, Rfl: 3   carvedilol  (COREG ) 12.5 MG tablet, TAKE 1 TABLET(12.5 MG) BY MOUTH TWICE DAILY WITH A MEAL, Disp: 120 tablet, Rfl: 1   Cholecalciferol (VITAMIN D) 50 MCG (2000 UT) tablet, Take 2,000 Units by mouth daily., Disp: , Rfl:    clopidogrel  (PLAVIX ) 75 MG tablet, Take 1 tablet (75 mg total) by mouth daily. TAKE 1 TABLET(75 MG) BY MOUTH DAILY, Disp: 90 tablet, Rfl: 3   dapagliflozin  propanediol (FARXIGA )  10 MG TABS tablet, Take 1 tablet (10 mg total) by mouth daily., Disp: 90 tablet, Rfl: 3   glipiZIDE  (GLUCOTROL  XL) 2.5 MG 24 hr tablet, TAKE 1 TABLET(2.5 MG) BY MOUTH DAILY WITH BREAKFAST, Disp: 90 tablet, Rfl: 1   losartan  (COZAAR ) 50 MG tablet, TAKE 1 TABLET(50 MG) BY MOUTH DAILY, Disp: 90 tablet, Rfl: 0   metFORMIN  (GLUCOPHAGE -XR) 500 MG 24 hr tablet, TAKE 2 TABLETS(1000 MG) BY MOUTH TWICE DAILY WITH A MEAL, Disp: 270 tablet, Rfl: 1   methocarbamol  (ROBAXIN ) 500 MG tablet, Take 1 tablet (500 mg total) by mouth every 6 (six) hours as needed for muscle spasms., Disp: 270 tablet, Rfl: 2   nitroGLYCERIN (NITROSTAT) 0.4 MG SL tablet, Place under the tongue., Disp: , Rfl:    rosuvastatin  (CRESTOR ) 10  MG tablet, TAKE 1 TABLET(10 MG) BY MOUTH DAILY, Disp: 90 tablet, Rfl: 1   Review of Systems Review of Systems  Constitutional:  Negative for chills, fever, malaise/fatigue and weight loss.  Eyes:  Negative for blurred vision.  Respiratory:  Negative for cough and shortness of breath.   Cardiovascular:  Negative for chest pain, palpitations and leg swelling.  Gastrointestinal:  Negative for abdominal pain, constipation, diarrhea, heartburn, nausea and vomiting.  Genitourinary:  Negative for frequency.  Musculoskeletal:  Negative for myalgias.  Skin:  Negative for itching and rash.  Neurological:  Negative for dizziness, weakness and headaches.  Endo/Heme/Allergies:  Negative for polydipsia.       Objective:    Vitals BP (!) 140/80   Pulse 71   Temp (!) 97.3 F (36.3 C) (Temporal)   Resp 16   Ht 6' 4 (1.93 m)   Wt 181 lb (82.1 kg)   SpO2 98%   BMI 22.03 kg/m    Physical Examination Physical Exam Constitutional:      Appearance: Normal appearance. He is not ill-appearing.  HENT:     Right Ear: Tympanic membrane, ear canal and external ear normal.     Left Ear: Ear canal and external ear normal.     Ears:     Comments: He has wax in his left ear Cardiovascular:     Rate and Rhythm: Normal rate and regular rhythm.     Pulses: Normal pulses.     Heart sounds: No murmur heard.    No friction rub. No gallop.  Pulmonary:     Effort: Pulmonary effort is normal. No respiratory distress.     Breath sounds: No wheezing, rhonchi or rales.  Abdominal:     General: Abdomen is flat. Bowel sounds are normal. There is no distension.     Palpations: Abdomen is soft.     Tenderness: There is no abdominal tenderness.  Musculoskeletal:     Right lower leg: No edema.     Left lower leg: No edema.  Skin:    General: Skin is warm and dry.     Findings: No rash.  Neurological:     Mental Status: He is alert.        Assessment & Plan:   Diabetic peripheral neuropathy [facility]) He  mostly came in today to discuss me leaving the practice.  He states he will try Dr. Caleen as a new doctor.  Essential hypertension His BP is still borderline.  We will see what his BP is doing on his next visit.  Impacted cerumen of left ear We did ear irrigation on the left with good effect.    No follow-ups on file.   Cheyenna Pankowski  Fleeta Finger, MD

## 2024-08-25 NOTE — Assessment & Plan Note (Signed)
 He mostly came in today to discuss me leaving the practice.  He states he will try Dr. Caleen as a new doctor.

## 2024-09-01 ENCOUNTER — Other Ambulatory Visit: Payer: Self-pay | Admitting: Internal Medicine

## 2024-09-20 ENCOUNTER — Other Ambulatory Visit: Payer: Self-pay | Admitting: Internal Medicine

## 2024-10-13 ENCOUNTER — Other Ambulatory Visit: Payer: Self-pay

## 2024-10-13 MED ORDER — METFORMIN HCL ER 500 MG PO TB24: ORAL_TABLET | ORAL | 1 refills | Status: AC

## 2024-10-15 ENCOUNTER — Encounter: Payer: Self-pay | Admitting: *Deleted

## 2024-10-15 ENCOUNTER — Ambulatory Visit: Admitting: Internal Medicine

## 2024-10-15 NOTE — Progress Notes (Signed)
 Jacob Short                                          MRN: [id number]   10/15/2024   The [facility] Quality Team Specialist reviewed this patient medical record for the purposes of chart review for care gap closure. The following were reviewed: chart review for care gap closure-controlling blood pressure.    [facility] Quality Team

## 2024-11-29 ENCOUNTER — Ambulatory Visit: Admitting: Internal Medicine
